# Patient Record
Sex: Female | Born: 1965 | Race: White | Hispanic: No | Marital: Married | Smoking: Never smoker
Health system: Southern US, Community
[De-identification: ages and names within clinical notes are randomized; demographics above are authoritative.]

## PROBLEM LIST (undated history)

## (undated) DIAGNOSIS — D219 Benign neoplasm of connective and other soft tissue, unspecified: Secondary | ICD-10-CM

## (undated) DIAGNOSIS — Z1211 Encounter for screening for malignant neoplasm of colon: Secondary | ICD-10-CM

## (undated) DIAGNOSIS — E785 Hyperlipidemia, unspecified: Secondary | ICD-10-CM

## (undated) DIAGNOSIS — M1990 Unspecified osteoarthritis, unspecified site: Secondary | ICD-10-CM

## (undated) DIAGNOSIS — M79671 Pain in right foot: Secondary | ICD-10-CM

## (undated) DIAGNOSIS — H8110 Benign paroxysmal vertigo, unspecified ear: Secondary | ICD-10-CM

## (undated) DIAGNOSIS — N946 Dysmenorrhea, unspecified: Secondary | ICD-10-CM

## (undated) DIAGNOSIS — H6120 Impacted cerumen, unspecified ear: Secondary | ICD-10-CM

## (undated) DIAGNOSIS — M722 Plantar fascial fibromatosis: Secondary | ICD-10-CM

## (undated) DIAGNOSIS — Z0000 Encounter for general adult medical examination without abnormal findings: Secondary | ICD-10-CM

## (undated) DIAGNOSIS — B353 Tinea pedis: Principal | ICD-10-CM

## (undated) DIAGNOSIS — N920 Excessive and frequent menstruation with regular cycle: Secondary | ICD-10-CM

## (undated) HISTORY — DX: Impacted cerumen, unspecified ear: H61.20

## (undated) HISTORY — DX: Benign neoplasm of connective and other soft tissue, unspecified: D21.9

## (undated) HISTORY — DX: Pain in right foot: M79.671

## (undated) HISTORY — PX: SPINE SURGERY: SHX786

## (undated) HISTORY — DX: Plantar fascial fibromatosis: M72.2

## (undated) HISTORY — PX: OTHER SURGICAL HISTORY: SHX169

## (undated) HISTORY — DX: Encounter for screening for malignant neoplasm of colon: Z12.11

## (undated) HISTORY — DX: Encounter for general adult medical examination without abnormal findings: Z00.00

## (undated) HISTORY — DX: Excessive and frequent menstruation with regular cycle: N92.0

## (undated) HISTORY — DX: Hyperlipidemia, unspecified: E78.5

## (undated) HISTORY — DX: Unspecified osteoarthritis, unspecified site: M19.90

## (undated) HISTORY — DX: Dysmenorrhea, unspecified: N94.6

## (undated) HISTORY — DX: Tinea pedis: B35.3

## (undated) HISTORY — DX: Benign paroxysmal vertigo, unspecified ear: H81.10

---
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
## 1998-03-26 ENCOUNTER — Other Ambulatory Visit: Admission: RE | Admit: 1998-03-26 | Discharge: 1998-03-26 | Payer: Self-pay | Admitting: *Deleted

## 1999-06-18 ENCOUNTER — Other Ambulatory Visit: Admission: RE | Admit: 1999-06-18 | Discharge: 1999-06-18 | Payer: Self-pay | Admitting: *Deleted

## 2000-06-22 ENCOUNTER — Other Ambulatory Visit: Admission: RE | Admit: 2000-06-22 | Discharge: 2000-06-22 | Payer: Self-pay | Admitting: *Deleted

## 2001-09-07 ENCOUNTER — Other Ambulatory Visit: Admission: RE | Admit: 2001-09-07 | Discharge: 2001-09-07 | Payer: Self-pay | Admitting: *Deleted

## 2002-09-11 ENCOUNTER — Other Ambulatory Visit: Admission: RE | Admit: 2002-09-11 | Discharge: 2002-09-11 | Payer: Self-pay | Admitting: *Deleted

## 2002-11-09 HISTORY — PX: OTHER SURGICAL HISTORY: SHX169

## 2002-12-10 ENCOUNTER — Ambulatory Visit (HOSPITAL_COMMUNITY): Admission: RE | Admit: 2002-12-10 | Discharge: 2002-12-10 | Payer: Self-pay | Admitting: Obstetrics and Gynecology

## 2002-12-10 ENCOUNTER — Encounter (INDEPENDENT_AMBULATORY_CARE_PROVIDER_SITE_OTHER): Payer: Self-pay

## 2003-09-17 ENCOUNTER — Other Ambulatory Visit: Admission: RE | Admit: 2003-09-17 | Discharge: 2003-09-17 | Payer: Self-pay | Admitting: *Deleted

## 2004-09-22 ENCOUNTER — Other Ambulatory Visit: Admission: RE | Admit: 2004-09-22 | Discharge: 2004-09-22 | Payer: Self-pay | Admitting: *Deleted

## 2005-10-25 ENCOUNTER — Other Ambulatory Visit: Admission: RE | Admit: 2005-10-25 | Discharge: 2005-10-25 | Payer: Self-pay | Admitting: Obstetrics & Gynecology

## 2006-12-07 ENCOUNTER — Other Ambulatory Visit: Admission: RE | Admit: 2006-12-07 | Discharge: 2006-12-07 | Payer: Self-pay | Admitting: Obstetrics & Gynecology

## 2007-12-13 ENCOUNTER — Other Ambulatory Visit: Admission: RE | Admit: 2007-12-13 | Discharge: 2007-12-13 | Payer: Self-pay | Admitting: Obstetrics & Gynecology

## 2009-06-09 ENCOUNTER — Encounter: Admission: RE | Admit: 2009-06-09 | Discharge: 2009-07-08 | Payer: Self-pay | Admitting: Obstetrics & Gynecology

## 2009-10-01 ENCOUNTER — Ambulatory Visit (HOSPITAL_COMMUNITY): Admission: RE | Admit: 2009-10-01 | Discharge: 2009-10-01 | Payer: Self-pay | Admitting: Internal Medicine

## 2010-06-26 LAB — HM MAMMOGRAPHY

## 2010-11-26 NOTE — Op Note (Signed)
NAME:  Jasmin Taylor, Jasmin Taylor                       ACCOUNT NO.:  [id number]   MEDICAL RECORD NO.:  [id number]                   PATIENT TYPE:  AMB   LOCATION:  SDC                                  FACILITY:  [facility]   PHYSICIAN:  Laqueta Linden, M.D.                 DATE OF BIRTH:  1965/10/29   DATE OF PROCEDURE:  12/10/2002  DATE OF DISCHARGE:                                 OPERATIVE REPORT   PREOPERATIVE DIAGNOSIS:  Large submucosal fibroid, with menorrhagia and  dysmenorrhea.   POSTOPERATIVE DIAGNOSIS:  Large submucosal fibroid, with menorrhagia and  dysmenorrhea.   PROCEDURE:  Hysteroscopic resection.   SURGEON:  Laqueta Linden, M.D.   ANESTHESIA:  General LMA.   ESTIMATED BLOOD LOSS:  Less than 100 mL.   SORBITOL NET INTAKE:  640 mL.   COMPLICATIONS:  None.   INDICATIONS:  The patient is a 45 year old nulligravid white female with a  large submucosal fibroid and associated menorrhagia, who desires  conservative management.  Sonohysterogram revealed a 3.1 x 2.9 x 2.7 cm  submucosal fibroid impinging almost totally on the endometrial cavity.  Due  to the extremely large size of the fibroid and the anticipated difficulty in  resecting it, she was treated with two months of preoperative Lupron Depot  3.75 mg to effect some preoperative shrinkage and facilitate surgery.  She  received her final injection on Nov 14, 2002.  She presents now for  hysteroscopic resection.  She and her husband have seen the informed consent  film and voiced their understanding and acceptance of the risks, benefits,  and alternatives and complications, including but not limited to anesthesia  risks, infection, bleeding, the possibility of requiring transfusion,  possible regrowth of the fibroid and need for repeat resection versus  hysterectomy, increased risk of incomplete resection due to the large size  of the fibroid, increased risk of uterine perforation due to the extensive  amount of resection  required, as well as the possibility of uterine scarring  and synechiae, which might cause problems with subsequent pregnancies.  They  have seen the informed consent film, voiced their understanding and  acceptance of all risks, and agreed to proceed.  She desires to restart her  patch contraceptive postoperatively.  Preoperative hemoglobin 13.3.  Coagulation studies normal.  Electrolytes normal.   DESCRIPTION OF PROCEDURE:  The patient was taken to the operating room an  after proper identification and consents were ascertained, she was placed on  the operating table in the supine position.  After the induction of general  LMA, she was placed in the [place] stirrups and the perineum and vagina were  prepped and draped in the routine sterile fashion.  A transurethral Foley  was placed, which was removed after the conclusion of the procedure, and  drained clear urine throughout.  Bimanual examination confirmed an anterior,  slightly irregular, mobile uterus.  The speculum was placed in  the vagina  and the cervix was grasped with a single-tooth tenaculum.  Of note, the  patient's cervix was quite tiny and nulliparous.  The tenaculum did pull off  a couple of times with a slight abrasion to the top of the cervix, although  there was no active bleeding or significant laceration noted.  The internal  os was gently dilated to a #33 Pratt dilator and the resectoscope with  double loop inserted under direct vision.  Continuous sorbitol infusion was  maintained.  The upper uterine fundus appeared within normal limits.  There  were no lesions other than the large submucosal fibroid protruding down from  the anterior uterine surface.  The resectoscope was placed on routine  settings and the fibroid resected in a routine manner with all tissue pieces  evacuated.  Of note, this was a very extensive resection as more and more of  the fibroid became available for resection due to myometrial contraction   throughout the procedure.  This procedure took approximately 30% greater  time and effort due to the large size and positioning of the fibroid.  All  tissue pieces were evacuated.  Hemostasis was noted to be excellent.  A post-  resection photograph was taken, although it was suboptimal due to being  somewhat underexposed.  In any event, it was felt that the fibroid had been  optimally resected.  No other lesions were identified.  All instruments were  removed.  There was a moderate amount of bleeding from the cervix, as would  be expected, with blood mixed with sorbitol.  The total net sorbitol intake  was 640 mL, although it was probably less due to some spillage.  Estimated  blood loss was less than 100 mL.  The tenaculum site was hemostatic.  All  instruments were removed.  The Foley catheter was removed, and the patient  was stable on transfer to the recovery room.  She will be observed and  discharged per anesthesia protocol.  She received 30 mg of Toradol IV and 30  mg IM prior to the conclusion of the procedure.  She will be discharged home  with routine instructions and told to start her Ortho Evra back the Sunday  following surgery.  She is to take Advil or Aleve as needed for any  cramping.  She is to follow up in the office in two to three weeks' time or  sooner for excessive pain, fever, bleeding, or other concerns.                                               Laqueta Linden, M.D.    LKS/MEDQ  D:  12/10/2002  T:  12/10/2002  Job:  [id number]

## 2011-08-19 ENCOUNTER — Encounter: Payer: Self-pay | Admitting: Family Medicine

## 2011-08-19 ENCOUNTER — Ambulatory Visit (INDEPENDENT_AMBULATORY_CARE_PROVIDER_SITE_OTHER): Payer: Self-pay | Admitting: Family Medicine

## 2011-08-19 DIAGNOSIS — Z0000 Encounter for general adult medical examination without abnormal findings: Secondary | ICD-10-CM

## 2011-08-19 DIAGNOSIS — E785 Hyperlipidemia, unspecified: Secondary | ICD-10-CM

## 2011-08-19 DIAGNOSIS — Z23 Encounter for immunization: Secondary | ICD-10-CM

## 2011-08-19 LAB — RENAL FUNCTION PANEL
Albumin: 4 g/dL (ref 3.5–5.2)
Calcium: 9.3 mg/dL (ref 8.4–10.5)
Chloride: 106 mEq/L (ref 96–112)
Potassium: 4.7 mEq/L (ref 3.5–5.1)
Sodium: 140 mEq/L (ref 135–145)

## 2011-08-19 LAB — LIPID PANEL
Cholesterol: 195 mg/dL (ref 0–200)
LDL Cholesterol: 115 mg/dL — ABNORMAL HIGH (ref 0–99)
Total CHOL/HDL Ratio: 3
VLDL: 19.4 mg/dL (ref 0.0–40.0)

## 2011-08-19 LAB — CBC
RBC: 4.23 Mil/uL (ref 3.87–5.11)
RDW: 12.9 % (ref 11.5–14.6)

## 2011-08-19 LAB — HEPATIC FUNCTION PANEL
ALT: 23 U/L (ref 0–35)
AST: 25 U/L (ref 0–37)
Albumin: 4 g/dL (ref 3.5–5.2)
Bilirubin, Direct: 0.1 mg/dL (ref 0.0–0.3)
Total Bilirubin: 0.5 mg/dL (ref 0.3–1.2)

## 2011-08-19 LAB — TSH: TSH: 0.79 u[IU]/mL (ref 0.35–5.50)

## 2011-08-19 NOTE — Patient Instructions (Signed)

## 2011-08-22 ENCOUNTER — Encounter: Payer: Self-pay | Admitting: Family Medicine

## 2011-08-22 DIAGNOSIS — E785 Hyperlipidemia, unspecified: Secondary | ICD-10-CM | POA: Insufficient documentation

## 2011-08-22 DIAGNOSIS — Z0000 Encounter for general adult medical examination without abnormal findings: Secondary | ICD-10-CM

## 2011-08-22 HISTORY — DX: Encounter for general adult medical examination without abnormal findings: Z00.00

## 2011-08-22 NOTE — Assessment & Plan Note (Signed)
Avoid trans fats, start a MegaRed Krill oil cap daily

## 2011-08-22 NOTE — Progress Notes (Signed)
Patient ID: Jasmin Taylor, female   DOB: 05-15-1966, 46 y.o.   MRN: [id number] Jasmin Taylor [id number] Jan 10, 1966 08/22/2011      Progress Note New Patient  Subjective  Chief Complaint  Chief Complaint  Patient presents with  . Establish Care    new patient/ labs- fasting    HPI  Patient is a 46 year old Caucasian female who is in today for new patient appointment. Overall she is in very good health and offers no significant recent concerns. No recent illness, fevers, chills, chest pain, palpitations, shortness of breath, GI or GU complaints. She follows with Dr. Leda Quail of OB/GYN for her Paps and mammograms. She works at the [employer].  Physical Exam  Constitutional: She is oriented to person, place, and time and well-developed, well-nourished, and in no distress. No distress.  HENT:  Head: Normocephalic and atraumatic.  Right Ear: External ear normal.  Left Ear: External ear normal.  Nose: Nose normal.  Mouth/Throat: Oropharynx is clear and moist. No oropharyngeal exudate.  Eyes: Conjunctivae are normal. Pupils are equal, round, and reactive to light. Right eye exhibits no discharge. Left eye exhibits no discharge. No scleral icterus.  Neck: Normal range of motion. Neck supple. No thyromegaly present.  Cardiovascular: Normal rate, regular rhythm, normal heart sounds and intact distal pulses.   No murmur heard. Pulmonary/Chest: Effort normal and breath sounds normal. No respiratory distress. She has no wheezes. She has no rales.  Abdominal: Soft. Bowel sounds are normal. She exhibits no distension and no mass. There is no tenderness.  Musculoskeletal: Normal range of motion. She exhibits no edema and no tenderness.       Midline scar down thoracic and lumbar spine  Lymphadenopathy:    She has no cervical adenopathy.  Neurological: She is alert and oriented to person, place, and time. She has normal reflexes. No cranial nerve deficit. Coordination normal.  Skin:  Skin is warm and dry. No rash noted. She is not diaphoretic.  Psychiatric: Mood, memory and affect normal.    Past Medical History  Diagnosis Date  . Hyperlipidemia   . Preventative health care 08/22/2011    Past Surgical History  Procedure Date  . 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 Fibroidectomy     Family History  Problem Relation Age of Onset  . Diabetes Mother     type 2  . Heart attack Mother 12  . Heart disease Mother     MI 2005, s/p 3 stents  . Hypertension Mother   . Hyperlipidemia Mother   . Heart attack Maternal Grandmother 67  . Diabetes Maternal Grandmother     type 2  . Hypertension Maternal Grandmother   . Hyperlipidemia Maternal Grandmother   . Stroke Maternal Grandfather   . Heart attack Paternal Grandfather   . Heart disease Paternal Grandfather     MI?  Marland Kitchen Hyperlipidemia Sister   . Diabetes Brother     type 2  . Alcohol abuse Brother   . Mental illness Brother     History   Social History  . Marital Status: Married    Spouse Name: N/A    Number of Children: N/A  . Years of Education: N/A   Occupational History  . Not on file.   Social History Main Topics  . Smoking status: Never Smoker   . Smokeless tobacco: Never Used  . Alcohol Use: No  . Drug Use: No  . Sexually Active:  Yes -- Female partner(s)   Other Topics Concern  . Not on file   Social History Narrative  . No narrative on file    No current outpatient prescriptions on file prior to visit.    No Known Allergies  Review of Systems  Review of Systems  Constitutional: Negative for fever, chills and malaise/fatigue.  HENT: Negative for hearing loss, nosebleeds and congestion.   Eyes: Negative for discharge.  Respiratory: Negative for cough, sputum production, shortness of breath and wheezing.   Cardiovascular: Negative for chest pain, palpitations and leg swelling.  Gastrointestinal: Negative for heartburn, nausea, vomiting,  abdominal pain, diarrhea, constipation and blood in stool.  Genitourinary: Negative for dysuria, urgency, frequency and hematuria.  Musculoskeletal: Negative for myalgias, back pain and falls.  Skin: Negative for rash.  Neurological: Negative for dizziness, tremors, sensory change, focal weakness, loss of consciousness, weakness and headaches.  Endo/Heme/Allergies: Negative for polydipsia. Does not bruise/bleed easily.  Psychiatric/Behavioral: Negative for depression and suicidal ideas. The patient is not nervous/anxious and does not have insomnia.     Objective  BP 128/81  Pulse 88  Temp(Src) 99.4 F (37.4 C) (Temporal)  Ht 5' 5.75" (1.67 m)  Wt 140 lb 1.9 oz (63.558 kg)  BMI 22.79 kg/m2  SpO2 99%  LMP 08/19/2011  Physical Exam  See above     Assessment & Plan  Preventative health care Given Tdap today, labs run today. Encouraged heart healthy diet. Requests records from previous PMD  Hyperlipidemia Avoid trans fats, start a MegaRed Krill oil cap daily

## 2011-08-22 NOTE — Assessment & Plan Note (Signed)
Given Tdap today, labs run today. Encouraged heart healthy diet. Requests records from previous PMD

## 2011-08-29 ENCOUNTER — Telehealth: Payer: Self-pay | Admitting: Family Medicine

## 2011-08-29 NOTE — Telephone Encounter (Signed)
I informed patient of her lab results and she requested we mail her a copy.  I put the copy in the mail to go out on 08/30/11.

## 2011-12-02 ENCOUNTER — Ambulatory Visit (INDEPENDENT_AMBULATORY_CARE_PROVIDER_SITE_OTHER): Payer: 59 | Admitting: Family Medicine

## 2011-12-02 ENCOUNTER — Encounter: Payer: Self-pay | Admitting: Family Medicine

## 2011-12-02 VITALS — BP 122/87 | HR 78 | Temp 99.8°F | Ht 65.75 in | Wt 142.0 lb

## 2011-12-02 DIAGNOSIS — B353 Tinea pedis: Secondary | ICD-10-CM

## 2011-12-02 HISTORY — DX: Tinea pedis: B35.3

## 2011-12-02 NOTE — Patient Instructions (Signed)
Athlete's Foot Athlete's foot (tinea pedis) is a fungal infection of the skin on the feet. It often occurs on the skin between the toes or underneath the toes. It can also occur on the soles of the feet. Athlete's foot is more likely to occur in hot, humid weather. Not washing your feet or changing your socks often enough can contribute to athlete's foot. The infection can spread from person to person (contagious). CAUSES Athlete's foot is caused by a fungus. This fungus thrives in warm, moist places. Most people get athlete's foot by sharing shower stalls, towels, and wet floors with an infected person. People with weakened immune systems, including those with diabetes, may be more likely to get athlete's foot. SYMPTOMS   Itchy areas between the toes or on the soles of the feet.   White, flaky, or scaly areas between the toes or on the soles of the feet.   Tiny, intensely itchy blisters between the toes or on the soles of the feet.   Tiny cuts on the skin. These cuts can develop a bacterial infection.   Thick or discolored toenails.  DIAGNOSIS  Your caregiver can usually tell what the problem is by doing a physical exam. Your caregiver may also take a skin sample from the rash area. The skin sample may be examined under a microscope, or it may be tested to see if fungus will grow in the sample. A sample may also be taken from your toenail for testing. TREATMENT  Over-the-counter and prescription medicines can be used to kill the fungus. These medicines are available as powders or creams. Your caregiver can suggest medicines for you. Fungal infections respond slowly to treatment. You may need to continue using your medicine for several weeks. PREVENTION   Do not share towels.   Wear sandals in wet areas, such as shared locker rooms and shared showers.   Keep your feet dry. Wear shoes that allow air to circulate. Wear cotton or wool socks.  HOME CARE INSTRUCTIONS   Take medicines as  directed by your caregiver. Do not use steroid creams on athlete's foot.   Keep your feet clean and cool. Wash your feet daily and dry them thoroughly, especially between your toes.   Change your socks every day. Wear cotton or wool socks. In hot climates, you may need to change your socks 2 to 3 times per day.   Wear sandals or canvas tennis shoes with good air circulation.   If you have blisters, soak your feet in Burow's solution or Epsom salts for 20 to 30 minutes, 2 times a day to dry out the blisters. Make sure you dry your feet thoroughly afterward.  SEEK MEDICAL CARE IF:   You have a fever.   You have swelling, soreness, warmth, or redness in your foot.   You are not getting better after 7 days of treatment.   You are not completely cured after 30 days.   You have any problems caused by your medicines.  MAKE SURE YOU:   Understand these instructions.   Will watch your condition.   Will get help right away if you are not doing well or get worse.  Document Released: 06/24/2000 Document Revised: 06/16/2011 Document Reviewed: 04/15/2011 [facility] Patient Information 2012 [place], [place].  Soak feet in 1/2 warm water and distilled white vinegar nightly, then gently debride thickened area with pumice stone.  Apply Lamisil gel (terbenafine) twice daily. Spray shoes with Athlete's foot spray and apply powder to socks, change socks  twice daily when hot.  If no improvement call for prescription of Lamisil tabs.  For toenails apply Vick's vapor rub to nails after vinegar soak

## 2011-12-02 NOTE — Assessment & Plan Note (Signed)
Mild case right foot. Encouraged Lamisil gel to feet bid, soaks in distilled white vinegar and treat all shoes. Call for tabs which she has used in past if no resonse

## 2011-12-02 NOTE — Progress Notes (Signed)
Patient ID: Jasmin Taylor, female   DOB: 12/02/1965, 46 y.o.   MRN: [id number] SYNDI PUA [id number] 05/18/1966 12/02/2011      Progress Note-Follow Up  Subjective  Chief Complaint  Chief Complaint  Patient presents with  . fungus    on R foot- pinky toe X a few months    HPI  46 year old Caucasian female who is in today complaining of thickening and some itching patches on the bottom of her right foot. She struggled with tinea pedis and onychomycosis in the past. Is previously used Lamisil tablets with good effect. No trouble to her toenails thus far. No fevers, malaise or other signs of illness are noted. Her husband and her mother both have struggled with tinea pedis and onychomycosis. Has not tried any over-the-counter treatments thus far  Past Medical History  Diagnosis Date  . Hyperlipidemia   . Preventative health care 08/22/2011  . Tinea pedis 12/02/2011    Past Surgical History  Procedure Date  . 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 Fibroidectomy     Family History  Problem Relation Age of Onset  . Diabetes Mother     type 2  . Heart attack Mother 31  . Heart disease Mother     MI 2005, s/p 3 stents  . Hypertension Mother   . Hyperlipidemia Mother   . Heart attack Maternal Grandmother 67  . Diabetes Maternal Grandmother     type 2  . Hypertension Maternal Grandmother   . Hyperlipidemia Maternal Grandmother   . Stroke Maternal Grandfather   . Heart attack Paternal Grandfather   . Heart disease Paternal Grandfather     MI?  Marland Kitchen Hyperlipidemia Sister   . Diabetes Brother     type 2  . Alcohol abuse Brother   . Mental illness Brother     History   Social History  . Marital Status: Married    Spouse Name: N/A    Number of Children: N/A  . Years of Education: N/A   Occupational History  . Not on file.   Social History Main Topics  . Smoking status: Never Smoker   . Smokeless tobacco: Never Used  .  Alcohol Use: No  . Drug Use: No  . Sexually Active: Yes -- Female partner(s)   Other Topics Concern  . Not on file   Social History Narrative  . No narrative on file    Current Outpatient Prescriptions on File Prior to Visit  Medication Sig Dispense Refill  . Calcium Carbonate-Vitamin D (CALTRATE 600+D) 600-400 MG-UNIT per tablet Take 1 tablet by mouth daily.      . Multiple Vitamin (MULTIVITAMIN) tablet Take 1 tablet by mouth daily.      . Norgestimate-Ethinyl Estradiol Triphasic (ORTHO TRI-CYCLEN LO) 0.18/0.215/0.25 MG-25 MCG tablet Take 1 tablet by mouth daily.      . vitamin C (ASCORBIC ACID) 500 MG tablet Take 500 mg by mouth daily.        No Known Allergies  Review of Systems  Review of Systems  Constitutional: Negative for fever and malaise/fatigue.  HENT: Negative for congestion.   Eyes: Negative for discharge.  Respiratory: Negative for shortness of breath.   Cardiovascular: Negative for chest pain, palpitations and leg swelling.  Gastrointestinal: Negative for nausea, abdominal pain and diarrhea.  Genitourinary: Negative for dysuria.  Skin: Negative for rash.  Neurological: Negative for headaches.  Psychiatric/Behavioral: Negative for depression and suicidal ideas. The patient  is not nervous/anxious and does not have insomnia.     Objective  BP 122/87  Pulse 78  Temp(Src) 99.8 F (37.7 C) (Temporal)  Ht 5' 5.75" (1.67 m)  Wt 142 lb (64.411 kg)  BMI 23.09 kg/m2  SpO2 99%  LMP 10/28/2011  Physical Exam  Physical Exam  Constitutional: She is oriented to person, place, and time and well-developed, well-nourished, and in no distress. No distress.  HENT:  Head: Normocephalic and atraumatic.  Eyes: Conjunctivae are normal.  Neck: Neck supple. No thyromegaly present.  Cardiovascular: Normal rate, regular rhythm and normal heart sounds.   No murmur heard. Pulmonary/Chest: Effort normal and breath sounds normal. She has no wheezes.  Abdominal: She exhibits no  distension and no mass.  Musculoskeletal: She exhibits no edema.  Lymphadenopathy:    She has no cervical adenopathy.  Neurological: She is alert and oriented to person, place, and time.  Skin: Skin is warm and dry. No rash noted. She is not diaphoretic.       Thickened patches no erythema or skin breakdown on bottom of skin  Psychiatric: Memory, affect and judgment normal.    Lab Results  Component Value Date   TSH 0.79 08/19/2011   Lab Results  Component Value Date   WBC 6.5 08/19/2011   HGB 13.5 08/19/2011   HCT 39.7 08/19/2011   MCV 93.7 08/19/2011   PLT 283.0 08/19/2011   Lab Results  Component Value Date   CREATININE 0.7 08/19/2011   BUN 14 08/19/2011   NA 140 08/19/2011   K 4.7 08/19/2011   CL 106 08/19/2011   CO2 27 08/19/2011   Lab Results  Component Value Date   ALT 23 08/19/2011   AST 25 08/19/2011   ALKPHOS 74 08/19/2011   BILITOT 0.5 08/19/2011   Lab Results  Component Value Date   CHOL 195 08/19/2011   Lab Results  Component Value Date   HDL 60.60 08/19/2011   Lab Results  Component Value Date   LDLCALC 115* 08/19/2011   Lab Results  Component Value Date   TRIG 97.0 08/19/2011   Lab Results  Component Value Date   CHOLHDL 3 08/19/2011     Assessment & Plan  Tinea pedis Mild case right foot. Encouraged Lamisil gel to feet bid, soaks in distilled white vinegar and treat all shoes. Call for tabs which she has used in past if no resonse

## 2011-12-16 ENCOUNTER — Telehealth: Payer: Self-pay

## 2011-12-16 NOTE — Telephone Encounter (Signed)
Pt states she has a few questions about athletes feet. There is no fungus on toenails. But pt would like to know if its ok to paint toenails? Spray shoes with antifungal spray- once or once daily? Put powder in socks daily- antifungal or regular powder. Pt informed that MD is out of the office until Monday morning and pt stated to hold this question until Monday when she returns. Please advise?

## 2011-12-19 ENCOUNTER — Telehealth: Payer: Self-pay

## 2011-12-19 NOTE — Telephone Encounter (Signed)
Ok to pain toenails, spray shoes prior and after wearing is best

## 2011-12-19 NOTE — Telephone Encounter (Signed)
Can continue same cream and powder. The rash and splitting skin can occur with the fungus, it should get better with treatment if it does not she should come in and let us see it so we can try a stronger medication

## 2011-12-19 NOTE — Telephone Encounter (Signed)
Pt informed

## 2011-12-19 NOTE — Telephone Encounter (Signed)
Patient left a message stating that she wanted MD to know that she has a rash on her toes and a cut on the bottom of her little toe? Pt stated she was using the tinactin powder and the lamisil cream? Please advise?

## 2011-12-19 NOTE — Telephone Encounter (Signed)
Patient would also like to know if she should be using antifungal or regular powder? Please advise?

## 2011-12-19 NOTE — Telephone Encounter (Signed)
Antifungal, like Lotrimin or Tinactin

## 2011-12-20 NOTE — Telephone Encounter (Signed)
Left a message for patient to return my call. 

## 2011-12-20 NOTE — Telephone Encounter (Signed)
Patient informed. 

## 2012-06-22 ENCOUNTER — Ambulatory Visit (INDEPENDENT_AMBULATORY_CARE_PROVIDER_SITE_OTHER): Payer: 59 | Admitting: Family Medicine

## 2012-06-22 ENCOUNTER — Encounter: Payer: Self-pay | Admitting: Family Medicine

## 2012-06-22 VITALS — BP 118/72 | HR 67 | Temp 98.4°F | Ht 65.75 in | Wt 143.0 lb

## 2012-06-22 DIAGNOSIS — H6122 Impacted cerumen, left ear: Secondary | ICD-10-CM

## 2012-06-22 DIAGNOSIS — H6120 Impacted cerumen, unspecified ear: Secondary | ICD-10-CM

## 2012-06-22 HISTORY — DX: Impacted cerumen, unspecified ear: H61.20

## 2012-06-22 NOTE — Patient Instructions (Addendum)
Cerumen Impaction  A cerumen impaction is when the wax in your ear forms a plug. This plug usually causes reduced hearing. Sometimes it also causes an earache or dizziness. Removing a cerumen impaction can be difficult and painful. The wax sticks to the ear canal. The canal is sensitive and bleeds easily. If you try to remove a heavy wax buildup with a cotton tipped swab, you may push it in further.  Irrigation with water, suction, and small ear curettes may be used to clear out the wax. If the impaction is fixed to the skin in the ear canal, ear drops may be needed for a few days to loosen the wax. People who build up a lot of wax frequently can use ear wax removal products available in your local drugstore.  SEEK MEDICAL CARE IF:    You develop an earache, increased hearing loss, or marked dizziness.  Document Released: 08/04/2004 Document Revised: 09/19/2011 Document Reviewed: 09/24/2009  [employer] Patient Information 2013 [employer].

## 2012-06-22 NOTE — Progress Notes (Signed)
Patient ID: Jasmin Taylor, female   DOB: [birth year], 46 y.o.   MRN: [id number] Jasmin Taylor [id number] 1966/01/30 06/22/2012      Progress Note-Follow Up  Subjective  Chief Complaint  Chief Complaint  Patient presents with  . ears cleaned    left ear    HPI  Patient is a 46 yo caucasian female, intoday c/o wax in her ears. Notes a decrease in hearing in that ear. No pain, itching, swelling, fevers, HA, tinnitus, neurologic c/o. No congrestion, cough, malaise, myalgias, cp, sob. Has not tried anything otc and has been bothering her for a week or so  Past Medical History  Diagnosis Date  . Hyperlipidemia   . Preventative health care 08/22/2011  . Tinea pedis 12/02/2011  . Cerumen impaction 06/22/2012    Past Surgical History  Procedure Date  . 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 Fibroidectomy     Family History  Problem Relation Age of Onset  . Diabetes Mother     type 2  . Heart attack Mother 6  . Heart disease Mother     MI 2005, s/p 3 stents  . Hypertension Mother   . Hyperlipidemia Mother   . Heart attack Maternal Grandmother 67  . Diabetes Maternal Grandmother     type 2  . Hypertension Maternal Grandmother   . Hyperlipidemia Maternal Grandmother   . Stroke Maternal Grandfather   . Heart attack Paternal Grandfather   . Heart disease Paternal Grandfather     MI?  Marland Kitchen Hyperlipidemia Sister   . Diabetes Brother     type 2  . Alcohol abuse Brother   . Mental illness Brother     History   Social History  . Marital Status: Married    Spouse Name: N/A    Number of Children: N/A  . Years of Education: N/A   Occupational History  . Not on file.   Social History Main Topics  . Smoking status: Never Smoker   . Smokeless tobacco: Never Used  . Alcohol Use: No  . Drug Use: No  . Sexually Active: Yes -- Female partner(s)   Other Topics Concern  . Not on file   Social History Narrative  . No narrative on file     Current Outpatient Prescriptions on File Prior to Visit  Medication Sig Dispense Refill  . Calcium Carbonate-Vitamin D (CALTRATE 600+D) 600-400 MG-UNIT per tablet Take 1 tablet by mouth daily.      . Multiple Vitamin (MULTIVITAMIN) tablet Take 1 tablet by mouth daily.      . NON FORMULARY Omega red      . Norgestimate-Ethinyl Estradiol Triphasic (ORTHO TRI-CYCLEN LO) 0.18/0.215/0.25 MG-25 MCG tablet Take 1 tablet by mouth daily.      . vitamin C (ASCORBIC ACID) 500 MG tablet Take 500 mg by mouth daily.      . MELOXICAM PO Take 1 tablet by mouth daily as needed.        No Known Allergies  Review of Systems  Review of Systems  Constitutional: Negative for fever and malaise/fatigue.  HENT: Positive for hearing loss. Negative for congestion.        Only on left, sounds muffled  Eyes: Negative for discharge.  Respiratory: Negative for shortness of breath.   Cardiovascular: Negative for chest pain, palpitations and leg swelling.  Gastrointestinal: Negative for nausea, abdominal pain and diarrhea.  Genitourinary: Negative for dysuria.  Musculoskeletal: Negative for falls.  Skin: Negative for rash.  Neurological: Negative for loss of consciousness and headaches.  Endo/Heme/Allergies: Negative for polydipsia.  Psychiatric/Behavioral: Negative for depression and suicidal ideas. The patient is not nervous/anxious and does not have insomnia.     Objective  BP 118/72  Pulse 67  Temp 98.4 F (36.9 C) (Temporal)  Ht 5' 5.75" (1.67 m)  Wt 143 lb (64.864 kg)  BMI 23.26 kg/m2  SpO2 100%  LMP 06/22/2012  Physical Exam  Physical Exam  Constitutional: She is oriented to person, place, and time and well-developed, well-nourished, and in no distress. No distress.  HENT:  Head: Normocephalic and atraumatic.  Right Ear: External ear normal.  Nose: Nose normal.       Left TM obscured by cerumen, no erythema or selling in canal  Neck: Normal range of motion. Neck supple. No thyromegaly  present.  Pulmonary/Chest: Effort normal.  Musculoskeletal: She exhibits no edema.  Lymphadenopathy:    She has no cervical adenopathy.  Neurological: She is alert and oriented to person, place, and time.  Skin: Skin is warm and dry. She is not diaphoretic. No erythema.  Psychiatric: Affect normal.    Lab Results  Component Value Date   TSH 0.79 08/19/2011   Lab Results  Component Value Date   WBC 6.5 08/19/2011   HGB 13.5 08/19/2011   HCT 39.7 08/19/2011   MCV 93.7 08/19/2011   PLT 283.0 08/19/2011   Lab Results  Component Value Date   CREATININE 0.7 08/19/2011   BUN 14 08/19/2011   NA 140 08/19/2011   K 4.7 08/19/2011   CL 106 08/19/2011   CO2 27 08/19/2011   Lab Results  Component Value Date   ALT 23 08/19/2011   AST 25 08/19/2011   ALKPHOS 74 08/19/2011   BILITOT 0.5 08/19/2011   Lab Results  Component Value Date   CHOL 195 08/19/2011   Lab Results  Component Value Date   HDL 60.60 08/19/2011   Lab Results  Component Value Date   LDLCALC 115* 08/19/2011   Lab Results  Component Value Date   TRIG 97.0 08/19/2011   Lab Results  Component Value Date   CHOLHDL 3 08/19/2011     Assessment & Plan  Cerumen impaction No sign of inflammation or infection, flushed in office without difficulty

## 2012-06-22 NOTE — Assessment & Plan Note (Signed)
No sign of inflammation or infection, flushed in office without difficulty

## 2012-08-15 ENCOUNTER — Other Ambulatory Visit (INDEPENDENT_AMBULATORY_CARE_PROVIDER_SITE_OTHER): Payer: 59

## 2012-08-15 DIAGNOSIS — E785 Hyperlipidemia, unspecified: Secondary | ICD-10-CM

## 2012-08-15 LAB — HEPATIC FUNCTION PANEL
ALT: 21 U/L (ref 0–35)
Albumin: 3.8 g/dL (ref 3.5–5.2)
Bilirubin, Direct: 0 mg/dL (ref 0.0–0.3)
Total Protein: 6.9 g/dL (ref 6.0–8.3)

## 2012-08-15 LAB — CBC
Hemoglobin: 13 g/dL (ref 12.0–15.0)
MCHC: 34 g/dL (ref 30.0–36.0)
Platelets: 248 10*3/uL (ref 150.0–400.0)
RDW: 12.7 % (ref 11.5–14.6)
WBC: 6.3 10*3/uL (ref 4.5–10.5)

## 2012-08-15 LAB — LIPID PANEL: VLDL: 20.6 mg/dL (ref 0.0–40.0)

## 2012-08-15 LAB — RENAL FUNCTION PANEL
CO2: 26 mEq/L (ref 19–32)
GFR: 88.17 mL/min (ref >60.00)
Glucose, Bld: 96 mg/dL (ref 70–99)
Phosphorus: 3.1 mg/dL (ref 2.3–4.6)

## 2012-08-15 NOTE — Progress Notes (Signed)
Labs only

## 2012-08-17 ENCOUNTER — Ambulatory Visit: Payer: 59 | Admitting: Family Medicine

## 2012-08-21 ENCOUNTER — Ambulatory Visit: Payer: 59 | Admitting: Family Medicine

## 2012-08-22 ENCOUNTER — Ambulatory Visit: Payer: 59 | Admitting: Family Medicine

## 2012-08-27 ENCOUNTER — Encounter: Payer: 59 | Admitting: Family Medicine

## 2012-08-28 ENCOUNTER — Encounter: Payer: Self-pay | Admitting: Family Medicine

## 2012-08-28 ENCOUNTER — Ambulatory Visit (INDEPENDENT_AMBULATORY_CARE_PROVIDER_SITE_OTHER): Payer: 59 | Admitting: Family Medicine

## 2012-08-28 VITALS — BP 118/78 | HR 81 | Temp 97.9°F | Ht 65.75 in | Wt 144.0 lb

## 2012-08-28 DIAGNOSIS — Z0000 Encounter for general adult medical examination without abnormal findings: Secondary | ICD-10-CM

## 2012-08-28 DIAGNOSIS — E785 Hyperlipidemia, unspecified: Secondary | ICD-10-CM

## 2012-08-28 NOTE — Progress Notes (Signed)
Patient ID: Jasmin Taylor, female   DOB: 1965-07-13, 47 y.o.   MRN: [id number] Jasmin Taylor [id number] 09/09/1965 08/28/2012      Progress Note New Patient  Subjective  Chief Complaint  Chief Complaint  Patient presents with  . Annual Exam    physical    HPI  Patient is a 47 year old Caucasian female in today for annual exam. She reports physically she's doing well. She denies any recent illness. She denies any headaches, congestion, sore throat, chest pain, palpitations, shortness of breath, GI or GU complaints. She struggling with a very ill dog as well as very ill mother. She reports she reports healing both of these relatively well but acknowledges moments of sadness. She follows with dermatology Dr. Emily Filbert present no recent concerning symptoms. She also has a gynecologist to manage his her Paps and mammograms.  Past Medical History  Diagnosis Date  . Hyperlipidemia   . Preventative health care 08/22/2011  . Tinea pedis 12/02/2011  . Cerumen impaction 06/22/2012    Past Surgical History  Procedure Laterality Date  . 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 Fibroidectomy      Family History  Problem Relation Age of Onset  . Diabetes Mother     type 2  . Heart attack Mother 37  . Heart disease Mother     MI 2005, s/p 3 stents  . Hypertension Mother   . Hyperlipidemia Mother   . Stroke Mother     ministrokes  . Dementia Mother   . Heart attack Maternal Grandmother 67  . Diabetes Maternal Grandmother     type 2  . Hypertension Maternal Grandmother   . Hyperlipidemia Maternal Grandmother   . Stroke Maternal Grandfather   . Heart attack Paternal Grandfather   . Heart disease Paternal Grandfather     MI?  Marland Kitchen Hyperlipidemia Sister   . Diabetes Brother     type 2  . Alcohol abuse Brother   . Mental illness Brother     History   Social History  . Marital Status: Married    Spouse Name: N/A    Number of Children: N/A  .  Years of Education: N/A   Occupational History  . Not on file.   Social History Main Topics  . Smoking status: Never Smoker   . Smokeless tobacco: Never Used  . Alcohol Use: No  . Drug Use: No  . Sexually Active: Yes -- Female partner(s)   Other Topics Concern  . Not on file   Social History Narrative  . No narrative on file    Current Outpatient Prescriptions on File Prior to Visit  Medication Sig Dispense Refill  . Calcium Carbonate-Vitamin D (CALTRATE 600+D) 600-400 MG-UNIT per tablet Take 1 tablet by mouth daily.      . MELOXICAM PO Take 1 tablet by mouth daily as needed.      . Multiple Vitamin (MULTIVITAMIN) tablet Take 1 tablet by mouth daily.      . NON FORMULARY Omega red      . Norgestimate-Ethinyl Estradiol Triphasic (ORTHO TRI-CYCLEN LO) 0.18/0.215/0.25 MG-25 MCG tablet Take 1 tablet by mouth daily.      . vitamin C (ASCORBIC ACID) 500 MG tablet Take 500 mg by mouth daily.       No current facility-administered medications on file prior to visit.    No Known Allergies  Review of Systems  Review of Systems  Constitutional: Negative for  fever and malaise/fatigue.  HENT: Negative for congestion.   Eyes: Negative for discharge.  Respiratory: Negative for shortness of breath.   Cardiovascular: Negative for chest pain, palpitations and leg swelling.  Gastrointestinal: Negative for nausea, abdominal pain and diarrhea.  Genitourinary: Negative for dysuria.  Musculoskeletal: Negative for falls.  Skin: Negative for rash.  Neurological: Negative for loss of consciousness and headaches.  Endo/Heme/Allergies: Negative for polydipsia.  Psychiatric/Behavioral: Negative for depression and suicidal ideas. The patient is not nervous/anxious and does not have insomnia.     Objective  BP 118/78  Pulse 81  Temp(Src) 97.9 F (36.6 C) (Oral)  Ht 5' 5.75" (1.67 m)  Wt 144 lb (65.318 kg)  BMI 23.42 kg/m2  SpO2 97%  LMP 08/17/2012  Physical Exam  Physical Exam   Constitutional: She is oriented to person, place, and time and well-developed, well-nourished, and in no distress. No distress.  HENT:  Head: Normocephalic and atraumatic.  Right Ear: External ear normal.  Left Ear: External ear normal.  Nose: Nose normal.  Mouth/Throat: Oropharynx is clear and moist. No oropharyngeal exudate.  Eyes: Conjunctivae are normal. Pupils are equal, round, and reactive to light. Right eye exhibits no discharge. Left eye exhibits no discharge. No scleral icterus.  Neck: Normal range of motion. Neck supple. No thyromegaly present.  Cardiovascular: Normal rate, regular rhythm, normal heart sounds and intact distal pulses.   No murmur heard. Pulmonary/Chest: Effort normal and breath sounds normal. No respiratory distress. She has no wheezes. She has no rales.  Abdominal: Soft. Bowel sounds are normal. She exhibits no distension and no mass. There is no tenderness.  Musculoskeletal: Normal range of motion. She exhibits no edema and no tenderness.  Lymphadenopathy:    She has no cervical adenopathy.  Neurological: She is alert and oriented to person, place, and time. She has normal reflexes. No cranial nerve deficit. Coordination normal.  Skin: Skin is warm and dry. No rash noted. She is not diaphoretic.  Psychiatric: Mood, memory and affect normal.       Assessment & Plan  Preventative health care Struggling with a a sick pet and a very ill mother. Overall she feels she is handling this well. Encouraged good sleep, regular exercise and heart healthy diet.  Hyperlipidemia Mild, avoid trans fats, minimize simple carbs and saturated fats. Encouraged Krill oil caps.

## 2012-08-28 NOTE — Assessment & Plan Note (Signed)
Mild, avoid trans fats, minimize simple carbs and saturated fats. Encouraged Krill oil caps.

## 2012-08-28 NOTE — Patient Instructions (Addendum)
Consider a probiotic such as Digestive Advantage for any heartburn or antibiotic use  Preventive Care for Adults, Female A healthy lifestyle and preventive care can promote health and wellness. Preventive health guidelines for women include the following key practices.  A routine yearly physical is a good way to check with your caregiver about your health and preventive screening. It is a chance to share any concerns and updates on your health, and to receive a thorough exam.  Visit your dentist for a routine exam and preventive care every 6 months. Brush your teeth twice a day and floss once a day. Good oral hygiene prevents tooth decay and gum disease.  The frequency of eye exams is based on your age, health, family medical history, use of contact lenses, and other factors. Follow your caregiver's recommendations for frequency of eye exams.  Eat a healthy diet. Foods like vegetables, fruits, whole grains, low-fat dairy products, and lean protein foods contain the nutrients you need without too many calories. Decrease your intake of foods high in solid fats, added sugars, and salt. Eat the right amount of calories for you.Get information about a proper diet from your caregiver, if necessary.  Regular physical exercise is one of the most important things you can do for your health. Most adults should get at least 150 minutes of moderate-intensity exercise (any activity that increases your heart rate and causes you to sweat) each week. In addition, most adults need muscle-strengthening exercises on 2 or more days a week.  Maintain a healthy weight. The body mass index (BMI) is a screening tool to identify possible weight problems. It provides an estimate of body fat based on height and weight. Your caregiver can help determine your BMI, and can help you achieve or maintain a healthy weight.For adults 20 years and older:  A BMI below 18.5 is considered underweight.  A BMI of 18.5 to 24.9 is  normal.  A BMI of 25 to 29.9 is considered overweight.  A BMI of 30 and above is considered obese.  Maintain normal blood lipids and cholesterol levels by exercising and minimizing your intake of saturated fat. Eat a balanced diet with plenty of fruit and vegetables. Blood tests for lipids and cholesterol should begin at age 40 and be repeated every 5 years. If your lipid or cholesterol levels are high, you are over 50, or you are at high risk for heart disease, you may need your cholesterol levels checked more frequently.Ongoing high lipid and cholesterol levels should be treated with medicines if diet and exercise are not effective.  If you smoke, find out from your caregiver how to quit. If you do not use tobacco, do not start.  If you are pregnant, do not drink alcohol. If you are breastfeeding, be very cautious about drinking alcohol. If you are not pregnant and choose to drink alcohol, do not exceed 1 drink per day. One drink is considered to be 12 ounces (355 mL) of beer, 5 ounces (148 mL) of wine, or 1.5 ounces (44 mL) of liquor.  Avoid use of street drugs. Do not share needles with anyone. Ask for help if you need support or instructions about stopping the use of drugs.  High blood pressure causes heart disease and increases the risk of stroke. Your blood pressure should be checked at least every 1 to 2 years. Ongoing high blood pressure should be treated with medicines if weight loss and exercise are not effective.  If you are 55 to 47 years  old, ask your caregiver if you should take aspirin to prevent strokes.  Diabetes screening involves taking a blood sample to check your fasting blood sugar level. This should be done once every 3 years, after age 4, if you are within normal weight and without risk factors for diabetes. Testing should be considered at a younger age or be carried out more frequently if you are overweight and have at least 1 risk factor for diabetes.  Breast cancer  screening is essential preventive care for women. You should practice "breast self-awareness." This means understanding the normal appearance and feel of your breasts and may include breast self-examination. Any changes detected, no matter how small, should be reported to a caregiver. Women in their 7s and 30s should have a clinical breast exam (CBE) by a caregiver as part of a regular health exam every 1 to 3 years. After age 37, women should have a CBE every year. Starting at age 75, women should consider having a mammography (breast X-ray test) every year. Women who have a family history of breast cancer should talk to their caregiver about genetic screening. Women at a high risk of breast cancer should talk to their caregivers about having magnetic resonance imaging (MRI) and a mammography every year.  The Pap test is a screening test for cervical cancer. A Pap test can show cell changes on the cervix that might become cervical cancer if left untreated. A Pap test is a procedure in which cells are obtained and examined from the lower end of the uterus (cervix).  Women should have a Pap test starting at age 21.  Between ages 21 and 54, Pap tests should be repeated every 2 years.  Beginning at age 84, you should have a Pap test every 3 years as long as the past 3 Pap tests have been normal.  Some women have medical problems that increase the chance of getting cervical cancer. Talk to your caregiver about these problems. It is especially important to talk to your caregiver if a new problem develops soon after your last Pap test. In these cases, your caregiver may recommend more frequent screening and Pap tests.  The above recommendations are the same for women who have or have not gotten the vaccine for human papillomavirus (HPV).  If you had a hysterectomy for a problem that was not cancer or a condition that could lead to cancer, then you no longer need Pap tests. Even if you no longer need a Pap  test, a regular exam is a good idea to make sure no other problems are starting.  If you are between ages 41 and 47, and you have had normal Pap tests going back 10 years, you no longer need Pap tests. Even if you no longer need a Pap test, a regular exam is a good idea to make sure no other problems are starting.  If you have had past treatment for cervical cancer or a condition that could lead to cancer, you need Pap tests and screening for cancer for at least 20 years after your treatment.  If Pap tests have been discontinued, risk factors (such as a new sexual partner) need to be reassessed to determine if screening should be resumed.  The HPV test is an additional test that may be used for cervical cancer screening. The HPV test looks for the virus that can cause the cell changes on the cervix. The cells collected during the Pap test can be tested for HPV. The HPV  test could be used to screen women aged 9 years and older, and should be used in women of any age who have unclear Pap test results. After the age of 4, women should have HPV testing at the same frequency as a Pap test.  Colorectal cancer can be detected and often prevented. Most routine colorectal cancer screening begins at the age of 63 and continues through age 54. However, your caregiver may recommend screening at an earlier age if you have risk factors for colon cancer. On a yearly basis, your caregiver may provide home test kits to check for hidden blood in the stool. Use of a small camera at the end of a tube, to directly examine the colon (sigmoidoscopy or colonoscopy), can detect the earliest forms of colorectal cancer. Talk to your caregiver about this at age 26, when routine screening begins. Direct examination of the colon should be repeated every 5 to 10 years through age 44, unless early forms of pre-cancerous polyps or small growths are found.  Hepatitis C blood testing is recommended for all people born from [birth year] through  1965 and any individual with known risks for hepatitis C.  Practice safe sex. Use condoms and avoid high-risk sexual practices to reduce the spread of sexually transmitted infections (STIs). STIs include gonorrhea, chlamydia, syphilis, trichomonas, herpes, HPV, and human immunodeficiency virus (HIV). Herpes, HIV, and HPV are viral illnesses that have no cure. They can result in disability, cancer, and death. Sexually active women aged 52 and younger should be checked for chlamydia. Older women with new or multiple partners should also be tested for chlamydia. Testing for other STIs is recommended if you are sexually active and at increased risk.  Osteoporosis is a disease in which the bones lose minerals and strength with aging. This can result in serious bone fractures. The risk of osteoporosis can be identified using a bone density scan. Women ages 32 and over and women at risk for fractures or osteoporosis should discuss screening with their caregivers. Ask your caregiver whether you should take a calcium supplement or vitamin D to reduce the rate of osteoporosis.  Menopause can be associated with physical symptoms and risks. Hormone replacement therapy is available to decrease symptoms and risks. You should talk to your caregiver about whether hormone replacement therapy is right for you.  Use sunscreen with sun protection factor (SPF) of 30 or more. Apply sunscreen liberally and repeatedly throughout the day. You should seek shade when your shadow is shorter than you. Protect yourself by wearing long sleeves, pants, a wide-brimmed hat, and sunglasses year round, whenever you are outdoors.  Once a month, do a whole body skin exam, using a mirror to look at the skin on your back. Notify your caregiver of new moles, moles that have irregular borders, moles that are larger than a pencil eraser, or moles that have changed in shape or color.  Stay current with required immunizations.  Influenza. You  need a dose every fall (or winter). The composition of the flu vaccine changes each year, so being vaccinated once is not enough.  Pneumococcal polysaccharide. You need 1 to 2 doses if you smoke cigarettes or if you have certain chronic medical conditions. You need 1 dose at age 81 (or older) if you have never been vaccinated.  Tetanus, diphtheria, pertussis (Tdap, Td). Get 1 dose of Tdap vaccine if you are younger than age 35, are over 55 and have contact with an infant, are a Research scientist (physical sciences), are pregnant, or  simply want to be protected from whooping cough. After that, you need a Td booster dose every 10 years. Consult your caregiver if you have not had at least 3 tetanus and diphtheria-containing shots sometime in your life or have a deep or dirty wound.  HPV. You need this vaccine if you are a woman age 32 or younger. The vaccine is given in 3 doses over 6 months.  Measles, mumps, rubella (MMR). You need at least 1 dose of MMR if you were born in 1957 or later. You may also need a second dose.  Meningococcal. If you are age 70 to 66 and a first-year college student living in a residence hall, or have one of several medical conditions, you need to get vaccinated against meningococcal disease. You may also need additional booster doses.  Zoster (shingles). If you are age 34 or older, you should get this vaccine.  Varicella (chickenpox). If you have never had chickenpox or you were vaccinated but received only 1 dose, talk to your caregiver to find out if you need this vaccine.  Hepatitis A. You need this vaccine if you have a specific risk factor for hepatitis A virus infection or you simply wish to be protected from this disease. The vaccine is usually given as 2 doses, 6 to 18 months apart.  Hepatitis B. You need this vaccine if you have a specific risk factor for hepatitis B virus infection or you simply wish to be protected from this disease. The vaccine is given in 3 doses, usually over 6  months. Preventive Services / Frequency Ages 28 to 54  Blood pressure check.** / Every 1 to 2 years.  Lipid and cholesterol check.** / Every 5 years beginning at age 11.  Clinical breast exam.** / Every 3 years for women in their 81s and 30s.  Pap test.** / Every 2 years from ages 105 through 39. Every 3 years starting at age 69 through age 47 or 33 with a history of 3 consecutive normal Pap tests.  HPV screening.** / Every 3 years from ages 52 through ages 65 to 69 with a history of 3 consecutive normal Pap tests.  Hepatitis C blood test.** / For any individual with known risks for hepatitis C.  Skin self-exam. / Monthly.  Influenza immunization.** / Every year.  Pneumococcal polysaccharide immunization.** / 1 to 2 doses if you smoke cigarettes or if you have certain chronic medical conditions.  Tetanus, diphtheria, pertussis (Tdap, Td) immunization. / A one-time dose of Tdap vaccine. After that, you need a Td booster dose every 10 years.  HPV immunization. / 3 doses over 6 months, if you are 78 and younger.  Measles, mumps, rubella (MMR) immunization. / You need at least 1 dose of MMR if you were born in 1957 or later. You may also need a second dose.  Meningococcal immunization. / 1 dose if you are age 91 to 48 and a first-year college student living in a residence hall, or have one of several medical conditions, you need to get vaccinated against meningococcal disease. You may also need additional booster doses.  Varicella immunization.** / Consult your caregiver.  Hepatitis A immunization.** / Consult your caregiver. 2 doses, 6 to 18 months apart.  Hepatitis B immunization.** / Consult your caregiver. 3 doses usually over 6 months. Ages 32 to 80  Blood pressure check.** / Every 1 to 2 years.  Lipid and cholesterol check.** / Every 5 years beginning at age 1.  Clinical breast exam.** /  Every year after age 41.  Mammogram.** / Every year beginning at age 66 and  continuing for as long as you are in good health. Consult with your caregiver.  Pap test.** / Every 3 years starting at age 22 through age 57 or 24 with a history of 3 consecutive normal Pap tests.  HPV screening.** / Every 3 years from ages 32 through ages 52 to 31 with a history of 3 consecutive normal Pap tests.  Fecal occult blood test (FOBT) of stool. / Every year beginning at age 9 and continuing until age 12. You may not need to do this test if you get a colonoscopy every 10 years.  Flexible sigmoidoscopy or colonoscopy.** / Every 5 years for a flexible sigmoidoscopy or every 10 years for a colonoscopy beginning at age 1 and continuing until age 86.  Hepatitis C blood test.** / For all people born from [birth year] through 1965 and any individual with known risks for hepatitis C.  Skin self-exam. / Monthly.  Influenza immunization.** / Every year.  Pneumococcal polysaccharide immunization.** / 1 to 2 doses if you smoke cigarettes or if you have certain chronic medical conditions.  Tetanus, diphtheria, pertussis (Tdap, Td) immunization.** / A one-time dose of Tdap vaccine. After that, you need a Td booster dose every 10 years.  Measles, mumps, rubella (MMR) immunization. / You need at least 1 dose of MMR if you were born in 1957 or later. You may also need a second dose.  Varicella immunization.** / Consult your caregiver.  Meningococcal immunization.** / Consult your caregiver.  Hepatitis A immunization.** / Consult your caregiver. 2 doses, 6 to 18 months apart.  Hepatitis B immunization.** / Consult your caregiver. 3 doses, usually over 6 months. Ages 63 and over  Blood pressure check.** / Every 1 to 2 years.  Lipid and cholesterol check.** / Every 5 years beginning at age 29.  Clinical breast exam.** / Every year after age 1.  Mammogram.** / Every year beginning at age 64 and continuing for as long as you are in good health. Consult with your caregiver.  Pap test.** / Every  3 years starting at age 1 through age 38 or 25 with a 3 consecutive normal Pap tests. Testing can be stopped between 65 and 70 with 3 consecutive normal Pap tests and no abnormal Pap or HPV tests in the past 10 years.  HPV screening.** / Every 3 years from ages 34 through ages 20 or 61 with a history of 3 consecutive normal Pap tests. Testing can be stopped between 65 and 70 with 3 consecutive normal Pap tests and no abnormal Pap or HPV tests in the past 10 years.  Fecal occult blood test (FOBT) of stool. / Every year beginning at age 37 and continuing until age 28. You may not need to do this test if you get a colonoscopy every 10 years.  Flexible sigmoidoscopy or colonoscopy.** / Every 5 years for a flexible sigmoidoscopy or every 10 years for a colonoscopy beginning at age 1 and continuing until age 79.  Hepatitis C blood test.** / For all people born from [birth year] through 1965 and any individual with known risks for hepatitis C.  Osteoporosis screening.** / A one-time screening for women ages 89 and over and women at risk for fractures or osteoporosis.  Skin self-exam. / Monthly.  Influenza immunization.** / Every year.  Pneumococcal polysaccharide immunization.** / 1 dose at age 77 (or older) if you have never been vaccinated.  Tetanus, diphtheria, pertussis (  Tdap, Td) immunization. / A one-time dose of Tdap vaccine if you are over 65 and have contact with an infant, are a Research scientist (physical sciences), or simply want to be protected from whooping cough. After that, you need a Td booster dose every 10 years.  Varicella immunization.** / Consult your caregiver.  Meningococcal immunization.** / Consult your caregiver.  Hepatitis A immunization.** / Consult your caregiver. 2 doses, 6 to 18 months apart.  Hepatitis B immunization.** / Check with your caregiver. 3 doses, usually over 6 months. ** Family history and personal history of risk and conditions may change your caregiver's  recommendations. Document Released: 08/23/2001 Document Revised: 09/19/2011 Document Reviewed: 11/22/2010 [facility] Patient Information 2013 [place], [place].

## 2012-08-28 NOTE — Assessment & Plan Note (Signed)
Struggling with a a sick pet and a very ill mother. Overall she feels she is handling this well. Encouraged good sleep, regular exercise and heart healthy diet.

## 2012-09-20 ENCOUNTER — Encounter: Payer: Self-pay | Admitting: Family Medicine

## 2013-01-08 ENCOUNTER — Ambulatory Visit: Payer: 59 | Admitting: Family Medicine

## 2013-01-09 ENCOUNTER — Ambulatory Visit (INDEPENDENT_AMBULATORY_CARE_PROVIDER_SITE_OTHER): Payer: 59 | Admitting: Family

## 2013-01-09 ENCOUNTER — Encounter: Payer: Self-pay | Admitting: Family

## 2013-01-09 VITALS — BP 112/80 | HR 81 | Temp 98.4°F | Resp 16 | Wt 143.1 lb

## 2013-01-09 DIAGNOSIS — K644 Residual hemorrhoidal skin tags: Secondary | ICD-10-CM

## 2013-01-09 DIAGNOSIS — J069 Acute upper respiratory infection, unspecified: Secondary | ICD-10-CM | POA: Insufficient documentation

## 2013-01-09 MED ORDER — HYDROCORTISONE ACE-PRAMOXINE 1-1 % RE FOAM: 1.0000 | Freq: Two times a day (BID) | RECTAL | Status: DC

## 2013-01-09 NOTE — Assessment & Plan Note (Signed)
Miralax powder (available OTC) one cap in 8 ounces of juice once daily as needed for constipation. Eat lots of fresh fruits and veggies and drink 8 glasses of water a day. Sprinkle epsom salts in a warm bathtub and soak twice daily as needed. Start proctofoam twice daily until symptoms improve.

## 2013-01-09 NOTE — Progress Notes (Signed)
Subjective:    Patient ID: Jasmin Taylor, female    DOB: 03/23/1966, 47 y.o.   MRN: [id number]  HPI  Reports rectal discomfort for 10 days. Denies rectal bleeding or known hx of hemorrhoids.  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  URI-  Reports that she has been coughing, woke up with sore throat.  Reports that she had a GI virus overnight on Monday- with diarrhea.  Reports that she is eating/drinking ok. Denies current issue with fever.    Review of Systems See HPI  Past Medical History  Diagnosis Date  . Hyperlipidemia   . Preventative health care 08/22/2011  . Tinea pedis 12/02/2011  . Cerumen impaction 06/22/2012    History   Social History  . Marital Status: Married    Spouse Name: N/A    Number of Children: N/A  . Years of Education: N/A   Occupational History  . Not on file.   Social History Main Topics  . Smoking status: Never Smoker   . Smokeless tobacco: Never Used  . Alcohol Use: No  . Drug Use: No  . Sexually Active: Yes -- Female partner(s)   Other Topics Concern  . Not on file   Social History Narrative  . No narrative on file    Past Surgical History  Procedure Laterality Date  . 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 Fibroidectomy      Family History  Problem Relation Age of Onset  . Diabetes Mother     type 2  . Heart attack Mother 55  . Heart disease Mother     MI 2005, s/p 3 stents  . Hypertension Mother   . Hyperlipidemia Mother   . Stroke Mother     ministrokes  . Dementia Mother   . Heart attack Maternal Grandmother 67  . Diabetes Maternal Grandmother     type 2  . Hypertension Maternal Grandmother   . Hyperlipidemia Maternal Grandmother   . Stroke Maternal Grandfather   . Heart attack Paternal Grandfather   . Heart disease Paternal Grandfather     MI?  Marland Kitchen Hyperlipidemia Sister   . Diabetes Brother     type 2  . Alcohol abuse Brother   . Mental illness Brother     No Known  Allergies  Current Outpatient Prescriptions on File Prior to Visit  Medication Sig Dispense Refill  . Calcium Carbonate-Vitamin D (CALTRATE 600+D) 600-400 MG-UNIT per tablet Take 1 tablet by mouth daily.      . MELOXICAM PO Take 1 tablet by mouth daily as needed.      . Multiple Vitamin (MULTIVITAMIN) tablet Take 1 tablet by mouth daily.      . NON FORMULARY Omega red      . Norgestimate-Ethinyl Estradiol Triphasic (ORTHO TRI-CYCLEN LO) 0.18/0.215/0.25 MG-25 MCG tablet Take 1 tablet by mouth daily.      . vitamin C (ASCORBIC ACID) 500 MG tablet Take 500 mg by mouth daily.       No current facility-administered medications on file prior to visit.    BP 112/80  Pulse 81  Temp(Src) 98.4 F (36.9 C) (Oral)  Resp 16  Wt 143 lb 1.3 oz (64.901 kg)  BMI 23.27 kg/m2  SpO2 99%       Objective:   Physical Exam  Constitutional: She is oriented to person, place, and time. She appears well-developed and well-nourished. No distress.  HENT:  Head: Normocephalic  and atraumatic.  Right Ear: Tympanic membrane and ear canal normal.  Left Ear: Tympanic membrane and ear canal normal.  Mouth/Throat: No oropharyngeal exudate, posterior oropharyngeal edema or posterior oropharyngeal erythema.  Cardiovascular: Normal rate and regular rhythm.   No murmur heard. Pulmonary/Chest: Effort normal and breath sounds normal. No respiratory distress. She has no wheezes. She has no rales.  Genitourinary:  + exernal hemorrhoid is noted  Neurological: She is alert and oriented to person, place, and time.  Psychiatric: She has a normal mood and affect. Her behavior is normal. Judgment and thought content normal.          Assessment & Plan:

## 2013-01-09 NOTE — Patient Instructions (Addendum)
You may use Miralax powder (available OTC) one cap in 8 ounces of juice once daily as needed for constipation. Eat lots of fresh fruits and veggies and drink 8 glasses of water a day. Sprinkle epsom salts in a warm bathtub and soak twice daily as needed. Start proctofoam twice daily until symptoms improve.  Call if cold symptoms are not resolved in 1 week.

## 2013-01-09 NOTE — Assessment & Plan Note (Signed)
Mild URI symptoms.  Pt to call if symptoms worsen or if not improved in 2-3 days.

## 2013-02-21 ENCOUNTER — Other Ambulatory Visit: Payer: Self-pay | Admitting: Certified Nurse Midwife

## 2013-02-22 NOTE — Telephone Encounter (Signed)
eScribe request for refill on ORTHO TRICYCLEN LO Last filled - 03/01/12 X 1 YEAR Last AEX - 03/01/12 Next AEX - 03/04/13 1 month supply sent until AEX.

## 2013-03-04 ENCOUNTER — Encounter: Payer: Self-pay | Admitting: Certified Nurse Midwife

## 2013-03-04 ENCOUNTER — Ambulatory Visit: Payer: Self-pay | Admitting: Certified Nurse Midwife

## 2013-03-05 ENCOUNTER — Ambulatory Visit (INDEPENDENT_AMBULATORY_CARE_PROVIDER_SITE_OTHER): Payer: 59 | Admitting: Certified Nurse Midwife

## 2013-03-05 ENCOUNTER — Encounter: Payer: Self-pay | Admitting: Certified Nurse Midwife

## 2013-03-05 VITALS — BP 102/62 | HR 60 | Resp 16 | Ht 66.0 in | Wt 147.0 lb

## 2013-03-05 DIAGNOSIS — Z309 Encounter for contraceptive management, unspecified: Secondary | ICD-10-CM

## 2013-03-05 DIAGNOSIS — IMO0001 Reserved for inherently not codable concepts without codable children: Secondary | ICD-10-CM

## 2013-03-05 DIAGNOSIS — Z0000 Encounter for general adult medical examination without abnormal findings: Secondary | ICD-10-CM

## 2013-03-05 DIAGNOSIS — Z01419 Encounter for gynecological examination (general) (routine) without abnormal findings: Secondary | ICD-10-CM

## 2013-03-05 LAB — POCT URINALYSIS DIPSTICK
Leukocytes, UA: NEGATIVE
Protein, UA: NEGATIVE

## 2013-03-05 MED ORDER — NORGESTIM-ETH ESTRAD TRIPHASIC 0.18/0.215/0.25 MG-25 MCG PO TABS: 1.0000 | ORAL_TABLET | Freq: Every day | ORAL | Status: DC

## 2013-03-05 NOTE — Patient Instructions (Signed)

## 2013-03-05 NOTE — Progress Notes (Signed)
47 y.o. G0P0000 Married Caucasian Fe here for annual exam.  Periods normal, no issues. Contraception working well. Sees PCP for aex, labs. Recent visit to PCP for hemorrhoids, but has resolved with Miralax use.  No health issues today.  Patient's last menstrual period was 03/01/2013.          Sexually active: yes  The current method of family planning is OCP (estrogen/progesterone).    Exercising: no  exercise Smoker:  no  Health Maintenance: Pap:  03-01-12 neg HPV HR neg MMG:  09-05-12 normal Colonoscopy:  none BMD:   none TDaP:  2014 Labs: Poct urine-neg Self breast exam: not done   reports that she has never smoked. She has never used smokeless tobacco. She reports that she does not drink alcohol or use illicit drugs.  Past Medical History  Diagnosis Date  . Hyperlipidemia   . Preventative health care 08/22/2011  . Tinea pedis 12/02/2011  . Cerumen impaction 06/22/2012  . Dysmenorrhea   . Menorrhagia   . Fibroid     Past Surgical History  Procedure Laterality Date  . 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 Fibroidectomy    . Hysteroscopic resection  5/04    Current Outpatient Prescriptions  Medication Sig Dispense Refill  . Calcium Carbonate-Vitamin D (CALTRATE 600+D) 600-400 MG-UNIT per tablet Take 1 tablet by mouth daily.      . MELOXICAM PO Take 1 tablet by mouth daily as needed.      . Multiple Vitamin (MULTIVITAMIN) tablet Take 1 tablet by mouth daily.      . NON FORMULARY daily. Omega red       . ORTHO TRI-CYCLEN LO 0.18/0.215/0.25 MG-25 MCG tab TAKE 1 TABLET BY MOUTH EVERY DAY  28 tablet  0   No current facility-administered medications for this visit.    Family History  Problem Relation Age of Onset  . Diabetes Mother     type 2  . Heart attack Mother 47  . Heart disease Mother     MI 2005, s/p 3 stents  . Hypertension Mother   . Hyperlipidemia Mother   . Stroke Mother     ministrokes  . Dementia Mother   . Heart  attack Maternal Grandmother 67  . Diabetes Maternal Grandmother     type 2  . Hypertension Maternal Grandmother   . Hyperlipidemia Maternal Grandmother   . Stroke Maternal Grandfather   . Heart attack Paternal Grandfather   . Heart disease Paternal Grandfather     MI?  Marland Kitchen Hyperlipidemia Sister   . Diabetes Brother     type 2  . Alcohol abuse Brother   . Mental illness Brother     ROS:  Pertinent items are noted in HPI.  Otherwise, a comprehensive ROS was negative.  Exam:   BP 102/62  Pulse 60  Resp 16  Ht 5\' 6"  (1.676 m)  Wt 147 lb (66.679 kg)  BMI 23.74 kg/m2  LMP 03/01/2013 Height: 5\' 6"  (167.6 cm)  Ht Readings from Last 3 Encounters:  03/05/13 5\' 6"  (1.676 m)  08/28/12 5' 5.75" (1.67 m)  06/22/12 5' 5.75" (1.67 m)    General appearance: alert, cooperative and appears stated age Head: Normocephalic, without obvious abnormality, atraumatic Neck: no adenopathy, supple, symmetrical, trachea midline and thyroid normal to inspection and palpation Lungs: clear to auscultation bilaterally Breasts: normal appearance, no masses or tenderness, No nipple retraction or dimpling, No nipple discharge or bleeding, No  axillary or supraclavicular adenopathy Heart: regular rate and rhythm Abdomen: soft, non-tender; no masses,  no organomegaly Extremities: extremities normal, atraumatic, no cyanosis or edema Skin: Skin color, texture, turgor normal. No rashes or lesions Lymph nodes: Cervical, supraclavicular, and axillary nodes normal. No abnormal inguinal nodes palpated Neurologic: Grossly normal   Pelvic: External genitalia:  no lesions              Urethra:  normal appearing urethra with no masses, tenderness or lesions              Bartholin's and Skene's: normal                 Vagina: normal appearing vagina with normal color and discharge, no lesions              Cervix: normal, non tender              Pap taken: no Bimanual Exam:  Uterus:  normal size, contour, position,  consistency, mobility, non-tender and anteflexed              Adnexa: normal adnexa and no mass, fullness, tenderness               Rectovaginal: Confirms               Anus:  normal sphincter tone, no lesions  A:  Well Woman with normal exam  Contraception OCP desired  P:   Reviewed health and wellness pertinent to exam  Rx Ortho tricyclen see order  Pap smear as per guidelines   Mammogram yearly pap smear not taken today  counseled on breast self exam, mammography screening, use and side effects of OCP's, adequate intake of calcium and vitamin D, diet and exercise  Rv aex, prn  An After Visit Summary was printed and given to the patient.

## 2013-03-05 NOTE — Progress Notes (Signed)
Note reviewed, agree with plan.  Ruta Capece, MD  

## 2013-04-30 ENCOUNTER — Telehealth: Payer: Self-pay

## 2013-04-30 NOTE — Telephone Encounter (Signed)
Pt states she was visiting her mom at her nursing home and there was scabies. Pt doesn't think she has scabies but would like to know what she can do to prevent it?  Per MD pt can get tree oil and use as a body wash and shampoo.  Jasmin Taylor can you please let the patient know this please?

## 2013-05-01 NOTE — Telephone Encounter (Signed)
Patient informed; states she has appt w/Dermatology tomorrow & will discuss with them as well/SLS

## 2013-05-16 ENCOUNTER — Other Ambulatory Visit: Payer: Self-pay

## 2013-07-05 ENCOUNTER — Encounter: Payer: Self-pay | Admitting: Family Medicine

## 2013-07-05 ENCOUNTER — Ambulatory Visit (INDEPENDENT_AMBULATORY_CARE_PROVIDER_SITE_OTHER): Payer: 59 | Admitting: Family Medicine

## 2013-07-05 VITALS — BP 104/72 | HR 76 | Temp 98.1°F | Ht 65.75 in | Wt 142.1 lb

## 2013-07-05 DIAGNOSIS — M79609 Pain in unspecified limb: Secondary | ICD-10-CM

## 2013-07-05 DIAGNOSIS — M722 Plantar fascial fibromatosis: Secondary | ICD-10-CM

## 2013-07-05 DIAGNOSIS — M79671 Pain in right foot: Secondary | ICD-10-CM

## 2013-07-05 HISTORY — DX: Plantar fascial fibromatosis: M72.2

## 2013-07-05 HISTORY — DX: Pain in right foot: M79.671

## 2013-07-05 NOTE — Assessment & Plan Note (Signed)
For several months. Worse in am. Orthotics by [facility] online or Dr Margart Sickles has a machine at some [facility]  Night splint at Medical supply store or online [facility]/Dr Ralene Cork will consider referral if worse  Ice and Aspercreme or Icy Hot after stretching as directed twice daily

## 2013-07-05 NOTE — Progress Notes (Signed)
Pre visit review using our clinic review tool, if applicable. No additional management support is needed unless otherwise documented below in the visit note. 

## 2013-07-05 NOTE — Patient Instructions (Signed)
Orthotics by [facility] online or Dr Margart Sickles has a machine at some [employer] splint at Medical supply store or online [facility]/Dr Lenetta Quaker and Aspercreme or [employer] after stretching as directed twice daily  Plantar Fasciitis Plantar fasciitis is a common condition that causes foot pain. It is soreness (inflammation) of the band of tough fibrous tissue on the bottom of the foot that runs from the heel bone (calcaneus) to the ball of the foot. The cause of this soreness may be from excessive standing, poor fitting shoes, running on hard surfaces, being overweight, having an abnormal walk, or overuse (this is common in runners) of the painful foot or feet. It is also common in aerobic exercise dancers and ballet dancers. SYMPTOMS  Most people with plantar fasciitis complain of:  Severe pain in the morning on the bottom of their foot especially when taking the first steps out of bed. This pain recedes after a few minutes of walking.  Severe pain is experienced also during walking following a long period of inactivity.  Pain is worse when walking barefoot or up stairs DIAGNOSIS   Your caregiver will diagnose this condition by examining and feeling your foot.  Special tests such as X-rays of your foot, are usually not needed. PREVENTION   Consult a sports medicine professional before beginning a new exercise program.  Walking programs offer a good workout. With walking there is a lower chance of overuse injuries common to runners. There is less impact and less jarring of the joints.  Begin all new exercise programs slowly. If problems or pain develop, decrease the amount of time or distance until you are at a comfortable level.  Wear good shoes and replace them regularly.  Stretch your foot and the heel cords at the back of the ankle (Achilles tendon) both before and after exercise.  Run or exercise on even surfaces that are not hard. For example, asphalt is better than  pavement.  Do not run barefoot on hard surfaces.  If using a treadmill, vary the incline.  Do not continue to workout if you have foot or joint problems. Seek professional help if they do not improve. HOME CARE INSTRUCTIONS   Avoid activities that cause you pain until you recover.  Use ice or cold packs on the problem or painful areas after working out.  Only take over-the-counter or prescription medicines for pain, discomfort, or fever as directed by your caregiver.  Soft shoe inserts or athletic shoes with air or gel sole cushions may be helpful.  If problems continue or become more severe, consult a sports medicine caregiver or your own health care provider. Cortisone is a potent anti-inflammatory medication that may be injected into the painful area. You can discuss this treatment with your caregiver. MAKE SURE YOU:   Understand these instructions.  Will watch your condition.  Will get help right away if you are not doing well or get worse. Document Released: 03/22/2001 Document Revised: 09/19/2011 Document Reviewed: 05/21/2008 [facility] Patient Information 2014 [place], [place].

## 2013-07-05 NOTE — Progress Notes (Signed)
Patient ID: Jasmin Taylor, female   DOB: March 17, 1966, 47 y.o.   MRN: [id number] GHADEER KASTELIC [id number] Oct 01, 1965 07/05/2013      Progress Note-Follow Up  Subjective  Chief Complaint  Chief Complaint  Patient presents with  . Foot Pain    HPI  Patient is a 47 year old Caucasian female who is in today for several months with right and right heel pain. She reports it is worse upon arising first thing in the morning and after prolonged sitting. She acknowledges she was wearing shoes without packs a lot prior to this starting. It is painful to touch. No redness or warmth. No acute injury. She's not tried any stretching, orthotics her pain meds thus far.  Past Medical History  Diagnosis Date  . Hyperlipidemia   . Preventative health care 08/22/2011  . Tinea pedis 12/02/2011  . Cerumen impaction 06/22/2012  . Dysmenorrhea   . Menorrhagia   . Fibroid   . Pain of right heel 07/05/2013    Past Surgical History  Procedure Laterality Date  . 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 Fibroidectomy    . Hysteroscopic resection  5/04    Family History  Problem Relation Age of Onset  . Diabetes Mother     type 2  . Heart attack Mother 61  . Heart disease Mother     MI 2005, s/p 3 stents  . Hypertension Mother   . Hyperlipidemia Mother   . Stroke Mother     ministrokes  . Dementia Mother   . Heart attack Maternal Grandmother 67  . Diabetes Maternal Grandmother     type 2  . Hypertension Maternal Grandmother   . Hyperlipidemia Maternal Grandmother   . Stroke Maternal Grandfather   . Heart attack Paternal Grandfather   . Heart disease Paternal Grandfather     MI?  Marland Kitchen Hyperlipidemia Sister   . Diabetes Brother     type 2  . Alcohol abuse Brother   . Mental illness Brother     History   Social History  . Marital Status: Married    Spouse Name: N/A    Number of Children: N/A  . Years of Education: N/A   Occupational History  .  Not on file.   Social History Main Topics  . Smoking status: Never Smoker   . Smokeless tobacco: Never Used  . Alcohol Use: No  . Drug Use: No  . Sexual Activity: Yes    Partners: Male    Birth Control/ Protection: Pill   Other Topics Concern  . Not on file   Social History Narrative  . No narrative on file    Current Outpatient Prescriptions on File Prior to Visit  Medication Sig Dispense Refill  . Calcium Carbonate-Vitamin D (CALTRATE 600+D) 600-400 MG-UNIT per tablet Take 1 tablet by mouth daily.      . MELOXICAM PO Take 1 tablet by mouth daily as needed.      . Multiple Vitamin (MULTIVITAMIN) tablet Take 1 tablet by mouth daily.      . NON FORMULARY daily. Omega red       . Norgestimate-Ethinyl Estradiol Triphasic (ORTHO TRI-CYCLEN LO) 0.18/0.215/0.25 MG-25 MCG tab Take 1 tablet by mouth daily.  3 Package  4   No current facility-administered medications on file prior to visit.    No Known Allergies  Review of Systems  Review of Systems  Constitutional: Negative for fever and malaise/fatigue.  HENT:  Negative for congestion.   Eyes: Negative for discharge.  Respiratory: Negative for shortness of breath.   Cardiovascular: Negative for chest pain, palpitations and leg swelling.  Gastrointestinal: Negative for nausea, abdominal pain and diarrhea.  Genitourinary: Negative for dysuria.  Musculoskeletal: Positive for joint pain. Negative for falls.       Right heel pain  Skin: Negative for rash.  Neurological: Negative for loss of consciousness and headaches.  Endo/Heme/Allergies: Negative for polydipsia.  Psychiatric/Behavioral: Negative for depression and suicidal ideas. The patient is not nervous/anxious and does not have insomnia.     Objective  BP 104/72  Pulse 76  Temp(Src) 98.1 F (36.7 C) (Oral)  Ht 5' 5.75" (1.67 m)  Wt 142 lb 1.3 oz (64.447 kg)  BMI 23.11 kg/m2  SpO2 99%  LMP 06/19/2013  Physical Exam  Physical Exam  Constitutional: She is  oriented to person, place, and time and well-developed, well-nourished, and in no distress. No distress.  HENT:  Head: Normocephalic and atraumatic.  Eyes: Conjunctivae are normal.  Neck: Neck supple. No thyromegaly present.  Cardiovascular: Normal rate, regular rhythm and normal heart sounds.   No murmur heard. Pulmonary/Chest: Effort normal and breath sounds normal. She has no wheezes.  Abdominal: She exhibits no distension and no mass.  Musculoskeletal: She exhibits no edema.  Lymphadenopathy:    She has no cervical adenopathy.  Neurological: She is alert and oriented to person, place, and time.  Skin: Skin is warm and dry. No rash noted. She is not diaphoretic.  Psychiatric: Memory, affect and judgment normal.    Lab Results  Component Value Date   TSH 0.79 08/19/2011   Lab Results  Component Value Date   WBC 6.3 08/15/2012   HGB 13.0 08/15/2012   HCT 38.3 08/15/2012   MCV 91.6 08/15/2012   PLT 248.0 08/15/2012   Lab Results  Component Value Date   CREATININE 0.8 08/15/2012   BUN 16 08/15/2012   NA 140 08/15/2012   K 4.4 08/15/2012   CL 109 08/15/2012   CO2 26 08/15/2012   Lab Results  Component Value Date   ALT 21 08/15/2012   AST 22 08/15/2012   ALKPHOS 65 08/15/2012   BILITOT 0.5 08/15/2012   Lab Results  Component Value Date   CHOL 174 08/15/2012   Lab Results  Component Value Date   HDL 50.40 08/15/2012   Lab Results  Component Value Date   LDLCALC 103* 08/15/2012   Lab Results  Component Value Date   TRIG 103.0 08/15/2012   Lab Results  Component Value Date   CHOLHDL 3 08/15/2012     Assessment & Plan  Pain of right heel For several months. Worse in am. Orthotics by [facility]prem[facility] online or Dr Margart Sickles has a machine at [facility]  Night splint at Medical supply store or online [facility]/Dr Ralene Cork will consider referral if worse  Ice and Aspercreme or Icy Hot after stretching as directed twice daily

## 2013-08-22 ENCOUNTER — Telehealth: Payer: Self-pay | Admitting: *Deleted

## 2013-08-22 DIAGNOSIS — Z79899 Other long term (current) drug therapy: Secondary | ICD-10-CM

## 2013-08-22 DIAGNOSIS — I10 Essential (primary) hypertension: Secondary | ICD-10-CM

## 2013-08-22 DIAGNOSIS — E785 Hyperlipidemia, unspecified: Secondary | ICD-10-CM

## 2013-08-22 DIAGNOSIS — Z0000 Encounter for general adult medical examination without abnormal findings: Secondary | ICD-10-CM

## 2013-08-22 LAB — CBC WITH DIFFERENTIAL/PLATELET
Basophils Absolute: 0 10*3/uL (ref 0.0–0.1)
Basophils Relative: 1 % (ref 0–1)
EOS ABS: 0.2 10*3/uL (ref 0.0–0.7)
EOS PCT: 2 % (ref 0–5)
HEMATOCRIT: 38.5 % (ref 36.0–46.0)
Hemoglobin: 13.2 g/dL (ref 12.0–15.0)
LYMPHS PCT: 19 % (ref 12–46)
Lymphs Abs: 1.6 10*3/uL (ref 0.7–4.0)
MCH: 30.6 pg (ref 26.0–34.0)
MCHC: 34.3 g/dL (ref 30.0–36.0)
MCV: 89.3 fL (ref 78.0–100.0)
MONOS PCT: 5 % (ref 3–12)
Monocytes Absolute: 0.4 10*3/uL (ref 0.1–1.0)
NEUTROS ABS: 6.3 10*3/uL (ref 1.7–7.7)
Neutrophils Relative %: 73 % (ref 43–77)
Platelets: 283 10*3/uL (ref 150–400)
RBC: 4.31 MIL/uL (ref 3.87–5.11)
RDW: 12.8 % (ref 11.5–15.5)
WBC: 8.5 10*3/uL (ref 4.0–10.5)

## 2013-08-22 LAB — BASIC METABOLIC PANEL
BUN: 19 mg/dL (ref 6–23)
CALCIUM: 9.3 mg/dL (ref 8.4–10.5)
CHLORIDE: 105 meq/L (ref 96–112)
CO2: 28 mEq/L (ref 19–32)
Creat: 0.69 mg/dL (ref 0.50–1.10)
GLUCOSE: 85 mg/dL (ref 70–99)
Potassium: 4.5 mEq/L (ref 3.5–5.3)
SODIUM: 140 meq/L (ref 135–145)

## 2013-08-22 LAB — HEPATIC FUNCTION PANEL
ALBUMIN: 3.9 g/dL (ref 3.5–5.2)
ALT: 17 U/L (ref 0–35)
AST: 15 U/L (ref 0–37)
Alkaline Phosphatase: 72 U/L (ref 39–117)
BILIRUBIN DIRECT: 0.1 mg/dL (ref 0.0–0.3)
Indirect Bilirubin: 0.3 mg/dL (ref 0.2–1.2)
TOTAL PROTEIN: 6.4 g/dL (ref 6.0–8.3)
Total Bilirubin: 0.4 mg/dL (ref 0.2–1.2)

## 2013-08-22 LAB — LIPID PANEL
CHOL/HDL RATIO: 3.1 ratio
CHOLESTEROL: 168 mg/dL (ref 0–200)
HDL: 54 mg/dL (ref >39)
LDL Cholesterol: 82 mg/dL (ref 0–99)
Triglycerides: 161 mg/dL — ABNORMAL HIGH (ref <150)
VLDL: 32 mg/dL (ref 0–40)

## 2013-08-22 NOTE — Telephone Encounter (Signed)
Lab orders entered/SLS 

## 2013-08-23 LAB — TSH: TSH: 1.194 u[IU]/mL (ref 0.350–4.500)

## 2013-08-29 ENCOUNTER — Encounter: Payer: Self-pay | Admitting: Family Medicine

## 2013-08-29 ENCOUNTER — Ambulatory Visit (INDEPENDENT_AMBULATORY_CARE_PROVIDER_SITE_OTHER): Payer: 59 | Admitting: Family Medicine

## 2013-08-29 VITALS — BP 118/72 | HR 82 | Temp 98.4°F | Ht 65.75 in | Wt 141.0 lb

## 2013-08-29 DIAGNOSIS — M722 Plantar fascial fibromatosis: Secondary | ICD-10-CM

## 2013-08-29 DIAGNOSIS — Z0000 Encounter for general adult medical examination without abnormal findings: Secondary | ICD-10-CM

## 2013-08-29 NOTE — Progress Notes (Signed)
Pre-visit discussion using our clinic review tool. No additional management support is needed unless otherwise documented below in the visit note.  

## 2013-08-29 NOTE — Patient Instructions (Signed)
Hypertriglyceridemia  Diet for High blood levels of Triglycerides Most fats in food are triglycerides. Triglycerides in your blood are stored as fat in your body. High levels of triglycerides in your blood may put you at a greater risk for heart disease and stroke.  Normal triglyceride levels are less than 150 mg/dL. Borderline high levels are 150-199 mg/dl. High levels are 200 - 499 mg/dL, and very high triglyceride levels are greater than 500 mg/dL. The decision to treat high triglycerides is generally based on the level. For people with borderline or high triglyceride levels, treatment includes weight loss and exercise. Drugs are recommended for people with very high triglyceride levels. Many people who need treatment for high triglyceride levels have metabolic syndrome. This syndrome is a collection of disorders that often include: insulin resistance, high blood pressure, blood clotting problems, high cholesterol and triglycerides. TESTING PROCEDURE FOR TRIGLYCERIDES  You should not eat 4 hours before getting your triglycerides measured. The normal range of triglycerides is between 10 and 250 milligrams per deciliter (mg/dl). Some people may have extreme levels (1000 or above), but your triglyceride level may be too high if it is above 150 mg/dl, depending on what other risk factors you have for heart disease.  People with high blood triglycerides may also have high blood cholesterol levels. If you have high blood cholesterol as well as high blood triglycerides, your risk for heart disease is probably greater than if you only had high triglycerides. High blood cholesterol is one of the main risk factors for heart disease. CHANGING YOUR DIET  Your weight can affect your blood triglyceride level. If you are more than 20% above your ideal body weight, you may be able to lower your blood triglycerides by losing weight. Eating less and exercising regularly is the best way to combat this. Fat provides more  calories than any other food. The best way to lose weight is to eat less fat. Only 30% of your total calories should come from fat. Less than 7% of your diet should come from saturated fat. A diet low in fat and saturated fat is the same as a diet to decrease blood cholesterol. By eating a diet lower in fat, you may lose weight, lower your blood cholesterol, and lower your blood triglyceride level.  Eating a diet low in fat, especially saturated fat, may also help you lower your blood triglyceride level. Ask your dietitian to help you figure how much fat you can eat based on the number of calories your caregiver has prescribed for you.  Exercise, in addition to helping with weight loss may also help lower triglyceride levels.   Alcohol can increase blood triglycerides. You may need to stop drinking alcoholic beverages.  Too much carbohydrate in your diet may also increase your blood triglycerides. Some complex carbohydrates are necessary in your diet. These may include bread, rice, potatoes, other starchy vegetables and cereals.  Reduce "simple" carbohydrates. These may include pure sugars, candy, honey, and jelly without losing other nutrients. If you have the kind of high blood triglycerides that is affected by the amount of carbohydrates in your diet, you will need to eat less sugar and less high-sugar foods. Your caregiver can help you with this.  Adding 2-4 grams of fish oil (EPA+ DHA) may also help lower triglycerides. Speak with your caregiver before adding any supplements to your regimen. Following the Diet  Maintain your ideal weight. Your caregivers can help you with a diet. Generally, eating less food and getting more   exercise will help you lose weight. Joining a weight control group may also help. Ask your caregivers for a good weight control group in your area.  Eat low-fat foods instead of high-fat foods. This can help you lose weight too.  These foods are lower in fat. Eat MORE of these:    Dried beans, peas, and lentils.  Egg whites.  Low-fat cottage cheese.  Fish.  Lean cuts of meat, such as round, sirloin, rump, and flank (cut extra fat off meat you fix).  Whole grain breads, cereals and pasta.  Skim and nonfat dry milk.  Low-fat yogurt.  Poultry without the skin.  Cheese made with skim or part-skim milk, such as mozzarella, parmesan, farmers', ricotta, or pot cheese. These are higher fat foods. Eat LESS of these:   Whole milk and foods made from whole milk, such as American, blue, cheddar, monterey jack, and swiss cheese  High-fat meats, such as luncheon meats, sausages, knockwurst, bratwurst, hot dogs, ribs, corned beef, ground pork, and regular ground beef.  Fried foods. Limit saturated fats in your diet. Substituting unsaturated fat for saturated fat may decrease your blood triglyceride level. You will need to read package labels to know which products contain saturated fats.  These foods are high in saturated fat. Eat LESS of these:   Fried pork skins.  Whole milk.  Skin and fat from poultry.  Palm oil.  Butter.  Shortening.  Cream cheese.  Bacon.  Margarines and baked goods made from listed oils.  Vegetable shortenings.  Chitterlings.  Fat from meats.  Coconut oil.  Palm kernel oil.  Lard.  Cream.  Sour cream.  Fatback.  Coffee whiteners and non-dairy creamers made with these oils.  Cheese made from whole milk. Use unsaturated fats (both polyunsaturated and monounsaturated) moderately. Remember, even though unsaturated fats are better than saturated fats; you still want a diet low in total fat.  These foods are high in unsaturated fat:   Canola oil.  Sunflower oil.  Mayonnaise.  Almonds.  Peanuts.  Pine nuts.  Margarines made with these oils.  Safflower oil.  Olive oil.  Avocados.  Cashews.  Peanut butter.  Sunflower seeds.  Soybean oil.  Peanut  oil.  Olives.  Pecans.  Walnuts.  Pumpkin seeds. Avoid sugar and other high-sugar foods. This will decrease carbohydrates without decreasing other nutrients. Sugar in your food goes rapidly to your blood. When there is excess sugar in your blood, your liver may use it to make more triglycerides. Sugar also contains calories without other important nutrients.  Eat LESS of these:   Sugar, brown sugar, powdered sugar, jam, jelly, preserves, honey, syrup, molasses, pies, candy, cakes, cookies, frosting, pastries, colas, soft drinks, punches, fruit drinks, and regular gelatin.  Avoid alcohol. Alcohol, even more than sugar, may increase blood triglycerides. In addition, alcohol is high in calories and low in nutrients. Ask for sparkling water, or a diet soft drink instead of an alcoholic beverage. Suggestions for planning and preparing meals   Bake, broil, grill or roast meats instead of frying.  Remove fat from meats and skin from poultry before cooking.  Add spices, herbs, lemon juice or vinegar to vegetables instead of salt, rich sauces or gravies.  Use a non-stick skillet without fat or use no-stick sprays.  Cool and refrigerate stews and broth. Then remove the hardened fat floating on the surface before serving.  Refrigerate meat drippings and skim off fat to make low-fat gravies.  Serve more fish.  Use less butter,   margarine and other high-fat spreads on bread or vegetables.  Use skim or reconstituted non-fat dry milk for cooking.  Cook with low-fat cheeses.  Substitute low-fat yogurt or cottage cheese for all or part of the sour cream in recipes for sauces, dips or congealed salads.  Use half yogurt/half mayonnaise in salad recipes.  Substitute evaporated skim milk for cream. Evaporated skim milk or reconstituted non-fat dry milk can be whipped and substituted for whipped cream in certain recipes.  Choose fresh fruits for dessert instead of high-fat foods such as pies or  cakes. Fruits are naturally low in fat. When Dining Out   Order low-fat appetizers such as fruit or vegetable juice, pasta with vegetables or tomato sauce.  Select clear, rather than cream soups.  Ask that dressings and gravies be served on the side. Then use less of them.  Order foods that are baked, broiled, poached, steamed, stir-fried, or roasted.  Ask for margarine instead of butter, and use only a small amount.  Drink sparkling water, unsweetened tea or coffee, or diet soft drinks instead of alcohol or other sweet beverages. QUESTIONS AND ANSWERS ABOUT OTHER FATS IN THE BLOOD: SATURATED FAT, TRANS FAT, AND CHOLESTEROL What is trans fat? Trans fat is a type of fat that is formed when vegetable oil is hardened through a process called hydrogenation. This process helps makes foods more solid, gives them shape, and prolongs their shelf life. Trans fats are also called hydrogenated or partially hydrogenated oils.  What do saturated fat, trans fat, and cholesterol in foods have to do with heart disease? Saturated fat, trans fat, and cholesterol in the diet all raise the level of LDL "bad" cholesterol in the blood. The higher the LDL cholesterol, the greater the risk for coronary heart disease (CHD). Saturated fat and trans fat raise LDL similarly.  What foods contain saturated fat, trans fat, and cholesterol? High amounts of saturated fat are found in animal products, such as fatty cuts of meat, chicken skin, and full-fat dairy products like butter, whole milk, cream, and cheese, and in tropical vegetable oils such as palm, palm kernel, and coconut oil. Trans fat is found in some of the same foods as saturated fat, such as vegetable shortening, some margarines (especially hard or stick margarine), crackers, cookies, baked goods, fried foods, salad dressings, and other processed foods made with partially hydrogenated vegetable oils. Small amounts of trans fat also occur naturally in some animal  products, such as milk products, beef, and lamb. Foods high in cholesterol include liver, other organ meats, egg yolks, shrimp, and full-fat dairy products. How can I use the new food label to make heart-healthy food choices? Check the Nutrition Facts panel of the food label. Choose foods lower in saturated fat, trans fat, and cholesterol. For saturated fat and cholesterol, you can also use the Percent Daily Value (%DV): 5% DV or less is low, and 20% DV or more is high. (There is no %DV for trans fat.) Use the Nutrition Facts panel to choose foods low in saturated fat and cholesterol, and if the trans fat is not listed, read the ingredients and limit products that list shortening or hydrogenated or partially hydrogenated vegetable oil, which tend to be high in trans fat. POINTS TO REMEMBER:   Discuss your risk for heart disease with your caregivers, and take steps to reduce risk factors.  Change your diet. Choose foods that are low in saturated fat, trans fat, and cholesterol.  Add exercise to your daily routine if   it is not already being done. Participate in physical activity of moderate intensity, like brisk walking, for at least 30 minutes on most, and preferably all days of the week. No time? Break the 30 minutes into three, 10-minute segments during the day.  Stop smoking. If you do smoke, contact your caregiver to discuss ways in which they can help you quit.  Do not use street drugs.  Maintain a normal weight.  Maintain a healthy blood pressure.  Keep up with your blood work for checking the fats in your blood as directed by your caregiver. Document Released: 04/14/2004 Document Revised: 12/27/2011 Document Reviewed: 11/10/2008 [employer] Patient Information 2014 [employer].  

## 2013-09-01 ENCOUNTER — Encounter: Payer: Self-pay | Admitting: Family Medicine

## 2013-09-01 NOTE — Progress Notes (Signed)
Patient ID: Jasmin Taylor, female   DOB: 07-21-1965, 48 y.o.   MRN: [id number] STUTI SANDIN [id number] [birth year] 09/01/2013      Progress Note-Follow Up  Subjective  Chief Complaint  Chief Complaint  Patient presents with  . Annual Exam    HPI  Patient is a 48 year old Caucasian female who is in today for annual exam. She feels well. She does have worsening of persistent heel pain. Despite trying to change her footwear, stretch and ice she has pain in her right heel with weightbearing most notably. It is worse in the morning. No anterior falls. Otherwise denies complaints. No chest pain, palpitations, shortness of breath, GI or GU concerns.  Past Medical History  Diagnosis Date  . Hyperlipidemia   . Preventative health care 08/22/2011  . Tinea pedis 12/02/2011  . Cerumen impaction 06/22/2012  . Dysmenorrhea   . Menorrhagia   . Fibroid   . Pain of right heel 07/05/2013  . Plantar fasciitis of right foot 07/05/2013    Past Surgical History  Procedure Laterality Date  . 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 Fibroidectomy    . Hysteroscopic resection  5/04    Family History  Problem Relation Age of Onset  . Diabetes Mother     type 2  . Heart attack Mother 53  . Hypertension Mother   . Hyperlipidemia Mother   . Stroke Mother     ministrokes  . Dementia Mother   . Heart disease Mother     MI 2005, s/p 3 stents  . Congestive Heart Failure Mother   . Heart attack Maternal Grandmother 67  . Diabetes Maternal Grandmother     type 2  . Hypertension Maternal Grandmother   . Hyperlipidemia Maternal Grandmother   . Stroke Maternal Grandfather   . Heart attack Paternal Grandfather   . Heart disease Paternal Grandfather     MI?  Marland Kitchen Hyperlipidemia Sister   . Diabetes Brother     type 2  . Alcohol abuse Brother   . Mental illness Brother     History   Social History  . Marital Status: Married    Spouse Name: N/A    Number  of Children: N/A  . Years of Education: N/A   Occupational History  . Not on file.   Social History Main Topics  . Smoking status: Never Smoker   . Smokeless tobacco: Never Used  . Alcohol Use: No  . Drug Use: No  . Sexual Activity: Yes    Partners: Male    Birth Control/ Protection: Pill     Comment: no dietary restrictions, lives with husband   Other Topics Concern  . Not on file   Social History Narrative  . No narrative on file    Current Outpatient Prescriptions on File Prior to Visit  Medication Sig Dispense Refill  . Calcium Carbonate-Vitamin D (CALTRATE 600+D) 600-400 MG-UNIT per tablet Take 1 tablet by mouth daily.      . MELOXICAM PO Take 1 tablet by mouth daily as needed.      . Multiple Vitamin (MULTIVITAMIN) tablet Take 1 tablet by mouth daily.      . NON FORMULARY daily. Omega red       . Norgestimate-Ethinyl Estradiol Triphasic (ORTHO TRI-CYCLEN LO) 0.18/0.215/0.25 MG-25 MCG tab Take 1 tablet by mouth daily.  3 Package  4   No current facility-administered medications on file prior to visit.  No Known Allergies  Review of Systems  Review of Systems  Constitutional: Negative for fever and malaise/fatigue.  HENT: Negative for congestion.   Eyes: Negative for discharge.  Respiratory: Negative for shortness of breath.   Cardiovascular: Negative for chest pain, palpitations and leg swelling.  Gastrointestinal: Negative for nausea, abdominal pain and diarrhea.  Genitourinary: Negative for dysuria.  Musculoskeletal: Positive for joint pain. Negative for falls.       Heel pain noted worse with standing on it.   Skin: Negative for rash.  Neurological: Negative for loss of consciousness and headaches.  Endo/Heme/Allergies: Negative for polydipsia.  Psychiatric/Behavioral: Negative for depression and suicidal ideas. The patient is not nervous/anxious and does not have insomnia.     Objective  BP 118/72  Pulse 82  Temp(Src) 98.4 F (36.9 C) (Oral)  Ht  5' 5.75" (1.67 m)  Wt 141 lb (63.957 kg)  BMI 22.93 kg/m2  SpO2 97%  Physical Exam  Physical Exam  Constitutional: She is oriented to person, place, and time and well-developed, well-nourished, and in no distress. No distress.  HENT:  Head: Normocephalic and atraumatic.  Eyes: Conjunctivae are normal.  Neck: Neck supple. No thyromegaly present.  Cardiovascular: Normal rate, regular rhythm and normal heart sounds.   No murmur heard. Pulmonary/Chest: Effort normal and breath sounds normal. She has no wheezes.  Abdominal: Soft. Bowel sounds are normal. She exhibits no distension and no mass. There is no tenderness. There is no rebound and no guarding.  Musculoskeletal: Normal range of motion. She exhibits no edema and no tenderness.  Lymphadenopathy:    She has no cervical adenopathy.  Neurological: She is alert and oriented to person, place, and time.  Skin: Skin is warm and dry. No rash noted. She is not diaphoretic.  Psychiatric: Memory, affect and judgment normal.    Lab Results  Component Value Date   TSH 1.194 08/22/2013   Lab Results  Component Value Date   WBC 8.5 08/22/2013   HGB 13.2 08/22/2013   HCT 38.5 08/22/2013   MCV 89.3 08/22/2013   PLT 283 08/22/2013   Lab Results  Component Value Date   CREATININE 0.69 08/22/2013   BUN 19 08/22/2013   NA 140 08/22/2013   K 4.5 08/22/2013   CL 105 08/22/2013   CO2 28 08/22/2013   Lab Results  Component Value Date   ALT 17 08/22/2013   AST 15 08/22/2013   ALKPHOS 72 08/22/2013   BILITOT 0.4 08/22/2013   Lab Results  Component Value Date   CHOL 168 08/22/2013   Lab Results  Component Value Date   HDL 54 08/22/2013   Lab Results  Component Value Date   LDLCALC 82 08/22/2013   Lab Results  Component Value Date   TRIG 161* 08/22/2013   Lab Results  Component Value Date   CHOLHDL 3.1 08/22/2013     Assessment & Plan  Preventative health care Encouraged hear healthy diet, regular exercise and adequate sleep. Reviewed  annual labs  Plantar fasciitis of right foot Has failed conservative management with ice, stretching and NSAIDs, referred to sports med for further consideration.

## 2013-09-01 NOTE — Assessment & Plan Note (Signed)
Encouraged hear healthy diet, regular exercise and adequate sleep. Reviewed annual labs

## 2013-09-01 NOTE — Assessment & Plan Note (Signed)
Has failed conservative management with ice, stretching and NSAIDs, referred to sports med for further consideration.

## 2013-09-09 ENCOUNTER — Encounter: Payer: Self-pay | Admitting: Family Medicine

## 2013-09-09 ENCOUNTER — Other Ambulatory Visit (INDEPENDENT_AMBULATORY_CARE_PROVIDER_SITE_OTHER): Payer: 59

## 2013-09-09 ENCOUNTER — Ambulatory Visit (INDEPENDENT_AMBULATORY_CARE_PROVIDER_SITE_OTHER): Payer: 59 | Admitting: Family Medicine

## 2013-09-09 VITALS — BP 114/60 | HR 74 | Temp 99.4°F | Resp 16 | Wt 140.0 lb

## 2013-09-09 DIAGNOSIS — M629 Disorder of muscle, unspecified: Secondary | ICD-10-CM | POA: Insufficient documentation

## 2013-09-09 DIAGNOSIS — M2420 Disorder of ligament, unspecified site: Secondary | ICD-10-CM

## 2013-09-09 DIAGNOSIS — M722 Plantar fascial fibromatosis: Secondary | ICD-10-CM

## 2013-09-09 MED ORDER — NITROGLYCERIN 0.2 MG/HR TD PT24: MEDICATED_PATCH | TRANSDERMAL | Status: DC

## 2013-09-09 NOTE — Patient Instructions (Signed)
Very nice to meet you.  Ice bath 20 minutes 1-2 times daily.  Meloxicam daily for 10 days then as needed Spenoc orthotics at [employer] [facility] Exercises most days of the week. Look at sheet On stair heel drop then lift on to toes hold 2 seconds down slow for count of 4 seconds and repeat, 30 reps 1 set daily first week, 2 sets second week three sets daily thereafter.  Walk on toes across room forward and backward 10 x daily. Walk on heel across room forward 10 x Otherwise no barefoot.  Jalene Mullet or [place] ar my favorite.  Looking for rigid bottom shoe.  Spenco orthotics at [facility] would help Nitroglycerin Protocol   Apply 1/4 nitroglycerin patch to affected area nightly  Change position of patch within the affected area every 24 hours.  You may experience a headache during the first 1-2 weeks of using the patch, these should subside.  If you experience headaches after beginning nitroglycerin patch treatment, you may take your preferred over the counter pain reliever.  Another side effect of the nitroglycerin patch is skin irritation or rash related to patch adhesive.  Please notify our office if you develop more severe headaches or rash, and stop the patch.  Tendon healing with nitroglycerin patch may require 12 to 24 weeks depending on the extent of injury.  Men should not use if taking Viagra, Cialis, or Levitra.   Do not use if you have migraines or rosacea.   Come back and see me again in 3-4 weeks.

## 2013-09-09 NOTE — Progress Notes (Signed)
  [facility] [address], [place] [address] Phone: [phone] Subjective:    [name] by the request  of:  Penni Homans, MD   CC: bilateral foot pain.   GBT:[id number] Jasmin Taylor is a 48 y.o. female coming in with complaint of bilateral foot pain right greater than left. Patient states that the right side has been going on for multiple months. Patient has been trying some stretching, changing shoe wear, icing protocol continues to have pain in the right heel with weightbearing. States that it is worse in the morning. Denies any true injury to this area. Patient denies any radiation or any numbness of the toes. Patient is becoming very frustrated because it is starting to affect her daily activities. Patient states that it does not wake her up at night. Patient was in severity at 8/10. She does tried oral anti-inflammatories without any significant improvement.     Past medical history, social, surgical and family history all reviewed in electronic medical record.   Review of Systems: No headache, visual changes, nausea, vomiting, diarrhea, constipation, dizziness, abdominal pain, skin rash, fevers, chills, night sweats, weight loss, swollen lymph nodes, body aches, joint swelling, muscle aches, chest pain, shortness of breath, mood changes.   Objective Blood pressure 114/60, pulse 74, temperature 99.4 F (37.4 C), temperature source Oral, resp. rate 16, weight 140 lb 0.6 oz (63.522 kg), SpO2 98.00%.  General: No apparent distress alert and oriented x3 mood and affect normal, dressed appropriately.  HEENT: Pupils equal, extraocular movements intact  Respiratory: Patient's speak in full sentences and does not appear short of breath  Cardiovascular: No lower extremity edema, non tender, no erythema  Skin: Warm dry intact with no signs of infection or rash on extremities or on axial skeleton.  Abdomen: Soft nontender  Neuro:  Cranial nerves II through XII are intact, neurovascularly intact in all extremities with 2+ DTRs and 2+ pulses.  Lymph: No lymphadenopathy of posterior or anterior cervical chain or axillae bilaterally.  Gait normal with good balance and coordination.  MSK:  Non tender with full range of motion and good stability and symmetric strength and tone of shoulders, elbows, wrist, hip, knee and ankles bilaterally.  Normal inspection with no visable or palpable fat pad atrophy and no visible swelling/erythema. Foot exam bilaterally Patient is tender at medial insertion of plantar fascia into calcaneus. Great toe motion: Mild limitus with some fibular deviation Arch shape: Pes cavus Other foot breakdown: Mild breakdown of the transverse arch  MSK US performed of: Right ankle This study was ordered, performed, and interpreted by Charlann Boxer D.O.  Foot/Ankle:  Right All structures visualized.   Talar dome unremarkable  Ankle mortise without effusion. Peroneus longus and brevis tendons unremarkable on long and transverse views without sheath effusions. Posterior tibialis, flexor hallucis longus, and flexor digitorum longus tendons unremarkable on long and transverse views without sheath effusions. Achilles tendon visualized along length of tendon and unremarkable on long and transverse views without sheath effusion. Anterior Talofibular Ligament and Calcaneofibular Ligaments unremarkable and intact. Deltoid Ligament unremarkable and intact. Plantar fascia i chose a there is a tear possibly 1 cm from origin as well as increased size of 1.05 cm. Power doppler signal normal.  IMPRESSION:  Plantar fasciitis with tear     Impression and Recommendations:     This case required medical decision making of moderate complexity.

## 2013-09-09 NOTE — Assessment & Plan Note (Addendum)
As the patient at great length. We discussed diagnosis, prognosis, and rehabilitation. Home exercise program given, meloxicam daily for 10 days, and we will do nitroglycerin patch secondary to the longevity of the symptoms and the tear that is noted. We discussed icing protocol as well. Discussed proper shoe choices and over-the-counter orthotics. Patient back in 3 weeks for further evaluation. Continuing to have pain we may need to consider an injection. Patient may need custom orthotics in the long run as well.

## 2013-09-09 NOTE — Progress Notes (Signed)
Pre visit review using our clinic review tool, if applicable. No additional management support is needed unless otherwise documented below in the visit note. 

## 2013-09-30 ENCOUNTER — Ambulatory Visit: Payer: 59 | Admitting: Family Medicine

## 2013-10-14 ENCOUNTER — Other Ambulatory Visit (INDEPENDENT_AMBULATORY_CARE_PROVIDER_SITE_OTHER): Payer: 59

## 2013-10-14 ENCOUNTER — Ambulatory Visit (INDEPENDENT_AMBULATORY_CARE_PROVIDER_SITE_OTHER): Payer: 59 | Admitting: Family Medicine

## 2013-10-14 ENCOUNTER — Encounter: Payer: Self-pay | Admitting: Family Medicine

## 2013-10-14 VITALS — BP 122/78 | HR 73

## 2013-10-14 DIAGNOSIS — M722 Plantar fascial fibromatosis: Secondary | ICD-10-CM

## 2013-10-14 DIAGNOSIS — M629 Disorder of muscle, unspecified: Secondary | ICD-10-CM

## 2013-10-14 DIAGNOSIS — M2420 Disorder of ligament, unspecified site: Secondary | ICD-10-CM

## 2013-10-14 NOTE — Assessment & Plan Note (Signed)
Spent greater than 25 minutes with patient face-to-face and had greater than 50% of counseling including as described above in assessment and plan.  Patient is doing considerably better. Patient was given strengthening exercises and handout formation today. Discuss continuing the home exercises on her regular basis as well as discussed the importance of icing. Patient will do the nitroglycerin for another 3 weeks and then titrate down over the course of the next 3 weeks. Patient come back in 6 weeks for further evaluation. She continues to have problems at that time we need to consider formal physical therapy or injection.

## 2013-10-14 NOTE — Patient Instructions (Signed)
It is good to see you You are doing great.  Try icing at end of the day one way or another Continue exercises at least 3 times a week.  Stretch whenever you can.  Continue the nitro daily for another 3 weeks then go to 3 times a week for 2 weeks then stop for a week Come back in 6 weeks to make sure this has mostly resolved.

## 2013-10-14 NOTE — Progress Notes (Signed)
  [facility] [address] [place], [place] [address] Phone: [phone] Subjective:      CC: bilateral foot pain follow up.   [id number] Jasmin Taylor is a 48 y.o. female coming back for bilateral heel pain. Patient was diagnosed previously with plantar fasciitis with right being greater than left. Patient did have a plantar fascia tear noted last time and was started on nitroglycerin patches. Patient states that she's been doing the exercises intermittently and has not been icing because of the pain that is involved. Patient states that the pain is 60% better. Patient to make up her daily activities without any significant side effects. Denies any new symptoms. He should has gotten the over-the-counter orthotics which has been helpful as well. Patient states that she is feeling better overall.     Past medical history, social, surgical and family history all reviewed in electronic medical record.   Review of Systems: No headache, visual changes, nausea, vomiting, diarrhea, constipation, dizziness, abdominal pain, skin rash, fevers, chills, night sweats, weight loss, swollen lymph nodes, body aches, joint swelling, muscle aches, chest pain, shortness of breath, mood changes.   Objective Blood pressure 122/78, pulse 73, SpO2 99.00%.  General: No apparent distress alert and oriented x3 mood and affect normal, dressed appropriately.  HEENT: Pupils equal, extraocular movements intact  Respiratory: Patient's speak in full sentences and does not appear short of breath  Cardiovascular: No lower extremity edema, non tender, no erythema  Skin: Warm dry intact with no signs of infection or rash on extremities or on axial skeleton.  Abdomen: Soft nontender  Neuro: Cranial nerves II through XII are intact, neurovascularly intact in all extremities with 2+ DTRs and 2+ pulses.  Lymph: No lymphadenopathy of posterior or anterior cervical chain or axillae bilaterally.   Gait normal with good balance and coordination.  MSK:  Non tender with full range of motion and good stability and symmetric strength and tone of shoulders, elbows, wrist, hip, knee and ankles bilaterally.  Normal inspection with no visable or palpable fat pad atrophy and no visible swelling/erythema. Foot exam bilaterally Patient is nontender on exam Great toe motion: Mild limitus with some fibular deviation Arch shape: Pes cavus Other foot breakdown: Mild breakdown of the transverse arch  MSK US performed of: Right ankle This study was ordered, performed, and interpreted by Charlann Boxer D.O.  Foot/Ankle:  Right All structures visualized.   Talar dome unremarkable  Ankle mortise without effusion. Peroneus longus and brevis tendons unremarkable on long and transverse views without sheath effusions. Posterior tibialis, flexor hallucis longus, and flexor digitorum longus tendons unremarkable on long and transverse views without sheath effusions. Achilles tendon visualized along length of tendon and unremarkable on long and transverse views without sheath effusion. Anterior Talofibular Ligament and Calcaneofibular Ligaments unremarkable and intact. Deltoid Ligament unremarkable and intact. The patient's tear is remarkably smaller than previously was in only measures 0.71 cm with significant decrease in hypoechoic changes. Power doppler signal normal.  IMPRESSION:  Significant improvement with plantar fascia     Impression and Recommendations:     This case required medical decision making of moderate complexity.

## 2013-11-07 ENCOUNTER — Encounter: Payer: Self-pay | Admitting: Family Medicine

## 2013-11-21 ENCOUNTER — Telehealth: Payer: Self-pay | Admitting: Family Medicine

## 2013-11-21 NOTE — Telephone Encounter (Signed)
Patient states she saw Dr. Tamala Julian on Monday, DX: plantar faciitis.  She has since done lots of walking and pain is worse.  Should she come in for an appt now or wait to see if she gets better?

## 2013-11-21 NOTE — Telephone Encounter (Signed)
Discussed with pt

## 2013-11-25 ENCOUNTER — Ambulatory Visit: Payer: 59 | Admitting: Family Medicine

## 2013-12-25 ENCOUNTER — Encounter: Payer: Self-pay | Admitting: Family Medicine

## 2013-12-25 ENCOUNTER — Encounter: Payer: Self-pay | Admitting: *Deleted

## 2013-12-25 ENCOUNTER — Ambulatory Visit (INDEPENDENT_AMBULATORY_CARE_PROVIDER_SITE_OTHER): Payer: 59 | Admitting: Family Medicine

## 2013-12-25 VITALS — BP 112/80 | HR 66 | Ht 65.5 in | Wt 139.0 lb

## 2013-12-25 DIAGNOSIS — M2420 Disorder of ligament, unspecified site: Secondary | ICD-10-CM

## 2013-12-25 DIAGNOSIS — M722 Plantar fascial fibromatosis: Secondary | ICD-10-CM

## 2013-12-25 DIAGNOSIS — M629 Disorder of muscle, unspecified: Secondary | ICD-10-CM

## 2013-12-25 NOTE — Patient Instructions (Signed)
Good to see you [facility] will be calling you Wear the good shoes with working Continue the exercises and the nitro.  Try the new brace with walking See you again in 4 weeks.

## 2013-12-25 NOTE — Assessment & Plan Note (Signed)
Discussed with patient for quite some time. Patient was making improvement and now is having some worsening. Patient has been walking much more. Patient is to walk and better shoes at work and was given note specifying. In addition this patient was given a new brace with a pneumatic compression to help with the heel. We discussed continuing the nitroglycerin or discontinuing of the patient. We also discussed formal physical therapy and referral was placed. Patient showed proper technique for multiple exercises. Patient come back again in 4 weeks for further evaluation.  Spent greater than 25 minutes with patient face-to-face and had greater than 50% of counseling including as described above in assessment and plan.

## 2013-12-25 NOTE — Progress Notes (Signed)
  [facility] [place], [place] [address] Phone: [phone] Subjective:      CC: bilateral foot pain follow up.   [id number] Jasmin Taylor is a 48 y.o. female coming back for bilateral heel pain. Patient was diagnosed previously with plantar fasciitis.  Patient 2 months ago was doing significantly better. Patient states though since that time it is 30 get worse again. Especially on the right foot. Patient states that when she is working after working seems to be significantly more painful. Patient continues and nitroglycerin without any significant improvement. Patient denies any side effects. Patient patient though would like to be pain free of course. Patient denies any new symptoms except that he can even hurt with walking sometimes now.     Past medical history, social, surgical and family history all reviewed in electronic medical record.   Review of Systems: No headache, visual changes, nausea, vomiting, diarrhea, constipation, dizziness, abdominal pain, skin rash, fevers, chills, night sweats, weight loss, swollen lymph nodes, body aches, joint swelling, muscle aches, chest pain, shortness of breath, mood changes.   Objective Blood pressure 112/80, pulse 66, height 5' 5.5" (1.664 m), weight 139 lb (63.05 kg), SpO2 98.00%.  General: No apparent distress alert and oriented x3 mood and affect normal, dressed appropriately.  HEENT: Pupils equal, extraocular movements intact  Respiratory: Patient's speak in full sentences and does not appear short of breath  Cardiovascular: No lower extremity edema, non tender, no erythema  Skin: Warm dry intact with no signs of infection or rash on extremities or on axial skeleton.  Abdomen: Soft nontender  Neuro: Cranial nerves II through XII are intact, neurovascularly intact in all extremities with 2+ DTRs and 2+ pulses.  Lymph: No lymphadenopathy of posterior or anterior cervical chain or axillae  bilaterally.  Gait normal with good balance and coordination.  MSK:  Non tender with full range of motion and good stability and symmetric strength and tone of shoulders, elbows, wrist, hip, knee and ankles bilaterally.  Normal inspection with no visable or palpable fat pad atrophy and no visible swelling/erythema. Foot exam bilaterally Patient is nontender on exam Great toe motion: Mild limitus Arch shape: Pes cavus Other foot breakdown: Mild breakdown of the transverse arch  MSK US performed of: Right ankle This study was ordered, performed, and interpreted by Charlann Boxer D.O.  Foot/Ankle:  Right All structures visualized.   Talar dome unremarkable  Ankle mortise without effusion. Peroneus longus and brevis tendons unremarkable on long and transverse views without sheath effusions. Posterior tibialis, flexor hallucis longus, and flexor digitorum longus tendons unremarkable on long and transverse views without sheath effusions. Achilles tendon visualized along length of tendon and unremarkable on long and transverse views without sheath effusion. Anterior Talofibular Ligament and Calcaneofibular Ligaments unremarkable and intact. Deltoid Ligament unremarkable and intact. The patient's tear is remarkably smaller than previously was in only measures 0.61 cm with no significant hypoechoic changes. Power doppler signal normal.  IMPRESSION:  Significant improvement with plantar fascia     Impression and Recommendations:     This case required medical decision making of moderate complexity.

## 2013-12-27 ENCOUNTER — Telehealth: Payer: Self-pay | Admitting: Family Medicine

## 2013-12-27 ENCOUNTER — Encounter: Payer: Self-pay | Admitting: *Deleted

## 2013-12-27 NOTE — Telephone Encounter (Signed)
Pt request phone call from the assistant concern about the shoes, pt request Dr. Tamala Julian to give her Rx for the black shoes so her insurance can cover it.

## 2013-12-27 NOTE — Telephone Encounter (Signed)
Discussed with pt. Letter printed & pt will pick up Monday.

## 2014-01-24 ENCOUNTER — Ambulatory Visit: Payer: 59 | Admitting: Family Medicine

## 2014-01-27 ENCOUNTER — Other Ambulatory Visit (INDEPENDENT_AMBULATORY_CARE_PROVIDER_SITE_OTHER): Payer: 59

## 2014-01-27 ENCOUNTER — Encounter: Payer: Self-pay | Admitting: *Deleted

## 2014-01-27 ENCOUNTER — Ambulatory Visit (INDEPENDENT_AMBULATORY_CARE_PROVIDER_SITE_OTHER): Payer: 59 | Admitting: Family Medicine

## 2014-01-27 ENCOUNTER — Encounter: Payer: Self-pay | Admitting: Family Medicine

## 2014-01-27 VITALS — BP 112/82 | HR 64 | Ht 65.5 in | Wt 139.0 lb

## 2014-01-27 DIAGNOSIS — M722 Plantar fascial fibromatosis: Secondary | ICD-10-CM

## 2014-01-27 NOTE — Assessment & Plan Note (Signed)
Patient was given injection today and did state that it did feel somewhat better. We discussed icing protocol that will be beneficial. We discussed continuing home exercise as well as the bracing the patient was given previously. Patient did get new shoes I think would be helpful as well. Patient will followup with me and can in 4-6 weeks for further evaluation and treatment.  Spent greater than 25 minutes with patient face-to-face and had greater than 50% of counseling including as described above in assessment and plan.

## 2014-01-27 NOTE — Progress Notes (Signed)
[facility] [place], [place] [address] Phone: [phone] Subjective:      CC: bilateral foot pain follow up.   [id number] Jasmin Taylor is a 48 y.o. female coming back for bilateral heel pain. Patient was diagnosed previously with plantar fasciitis.  Patient has been doing physical therapy and has stopped the nitroglycerin patch. Patient continues to try to do the home exercises. Patient has gotten new shoes as well as over-the-counter orthotics with some mild improvement. Overall patient has made no significant improve and she states. She states that it continues to give her a dull aching pain even with walking. Patient continues to work but finds it difficult. Patient is done all conservative therapy at this time with no significant improvement..     Past medical history, social, surgical and family history all reviewed in electronic medical record.   Review of Systems: No headache, visual changes, nausea, vomiting, diarrhea, constipation, dizziness, abdominal pain, skin rash, fevers, chills, night sweats, weight loss, swollen lymph nodes, body aches, joint swelling, muscle aches, chest pain, shortness of breath, mood changes.   Objective Blood pressure 112/82, pulse 64, height 5' 5.5" (1.664 m), weight 139 lb (63.05 kg), SpO2 99.00%.  General: No apparent distress alert and oriented x3 mood and affect normal, dressed appropriately.  HEENT: Pupils equal, extraocular movements intact  Respiratory: Patient's speak in full sentences and does not appear short of breath  Cardiovascular: No lower extremity edema, non tender, no erythema  Skin: Warm dry intact with no signs of infection or rash on extremities or on axial skeleton.  Abdomen: Soft nontender  Neuro: Cranial nerves II through XII are intact, neurovascularly intact in all extremities with 2+ DTRs and 2+ pulses.  Lymph: No lymphadenopathy of posterior or anterior cervical chain or  axillae bilaterally.  Gait normal with good balance and coordination.  MSK:  Non tender with full range of motion and good stability and symmetric strength and tone of shoulders, elbows, wrist, hip, knee and ankles bilaterally.  Normal inspection with no visable or palpable fat pad atrophy and no visible swelling/erythema. Foot exam bilaterally Patient is again tender to palpation over the medial calcaneal region Great toe motion: Mild limitus Arch shape: Pes cavus Other foot breakdown: Mild breakdown of the transverse arch  MSK US performed of: Right ankle This study was ordered, performed, and interpreted by Charlann Boxer D.O.  Foot/Ankle:  Right All structures visualized.   Talar dome unremarkable  Ankle mortise without effusion. Peroneus longus and brevis tendons unremarkable on long and transverse views without sheath effusions. Posterior tibialis, flexor hallucis longus, and flexor digitorum longus tendons unremarkable on long and transverse views without sheath effusions. Achilles tendon visualized along length of tendon and unremarkable on long and transverse views without sheath effusion. Anterior Talofibular Ligament and Calcaneofibular Ligaments unremarkable and intact. Deltoid Ligament unremarkable and intact. The patient's tear is remarkably smaller than previously was in only measures 1.21 cm with increasing hypoechoic changes from previous exam. IMPRESSION:  Significant improvement with plantar fascia   Procedure note After verbal consent patient was prepped with alcohol swabs and from a medial approach under ultrasound guidance patient was injected with 1 cc of 0.5% Marcaine and 1 cc of Kenalog 40 mg/dL into the medial calcaneal area at the origin of the plantar fascia. Patient tolerated the procedure well and post injection instructions given.   Impression: Successful ultrasound-guided injection.     Impression and Recommendations:     This  case required medical  decision making of moderate complexity.

## 2014-01-27 NOTE — Patient Instructions (Signed)
Good to see you Continue the exercises and I love the shoes Return to work on Thursday.  Ice is still your best friend See you in 3-4 weeks

## 2014-02-24 ENCOUNTER — Encounter: Payer: Self-pay | Admitting: Family Medicine

## 2014-02-24 ENCOUNTER — Ambulatory Visit (INDEPENDENT_AMBULATORY_CARE_PROVIDER_SITE_OTHER): Payer: 59 | Admitting: Family Medicine

## 2014-02-24 VITALS — BP 122/80 | HR 76 | Ht 65.5 in | Wt 141.0 lb

## 2014-02-24 DIAGNOSIS — M722 Plantar fascial fibromatosis: Secondary | ICD-10-CM

## 2014-02-24 NOTE — Patient Instructions (Signed)
It is good to see you I am glad you are doing better.  Continue the exercises 3 times a week.  Ice is your friend at the end of the day.  See me when you need me.

## 2014-02-24 NOTE — Assessment & Plan Note (Addendum)
Patient is doing significantly better one month after the steroid injection. I am encouraged that she continue. Patient will continue the exercises as well as the icing. Patient is wearing good shoes which I think will be beneficial. I do think patient is a candidate for custom orthotics secondary to her employment. Patient will come back and see me again hopefully soon and we will consider custom orthotics.

## 2014-02-24 NOTE — Progress Notes (Signed)
  [facility] [address] [place], [place] [address] Phone: [phone] Subjective:      CC: bilateral foot pain follow up.   [id number] Jasmin Taylor is a 48 y.o. female coming back for bilateral heel pain. Patient was diagnosed previously with plantar fasciitis.  Patient has been doing physical therapy and has stopped the nitroglycerin patch.  At last visit patient did have injection. Patient continues to try to do the home exercises. Patient has gotten new shoes as well as over-the-counter orthotics.  Patient states since last injection doing significantly better. Patient states that she can make it through all day without any significant pain. Denies any new symptoms. Denies any nighttime awakening. Only a dull aching sensation if she does a significant amount of walking.     Past medical history, social, surgical and family history all reviewed in electronic medical record.   Review of Systems: No headache, visual changes, nausea, vomiting, diarrhea, constipation, dizziness, abdominal pain, skin rash, fevers, chills, night sweats, weight loss, swollen lymph nodes, body aches, joint swelling, muscle aches, chest pain, shortness of breath, mood changes.   Objective Blood pressure 122/80, pulse 76, height 5' 5.5" (1.664 m), weight 141 lb (63.957 kg), SpO2 98.00%.  General: No apparent distress alert and oriented x3 mood and affect normal, dressed appropriately.  HEENT: Pupils equal, extraocular movements intact  Respiratory: Patient's speak in full sentences and does not appear short of breath  Cardiovascular: No lower extremity edema, non tender, no erythema  Skin: Warm dry intact with no signs of infection or rash on extremities or on axial skeleton.  Abdomen: Soft nontender  Neuro: Cranial nerves II through XII are intact, neurovascularly intact in all extremities with 2+ DTRs and 2+ pulses.  Lymph: No lymphadenopathy of posterior or anterior  cervical chain or axillae bilaterally.  Gait normal with good balance and coordination.  MSK:  Non tender with full range of motion and good stability and symmetric strength and tone of shoulders, elbows, wrist, hip, knee and ankles bilaterally.  Normal inspection with no visable or palpable fat pad atrophy and no visible swelling/erythema. Foot exam bilaterally Nontender  Great toe motion: Mild limitus Arch shape: Pes cavus Other foot breakdown: Mild breakdown of the transverse arch  MSK US performed of: Right ankle This study was ordered, performed, and interpreted by Charlann Boxer D.O.  Foot/Ankle:  Right All structures visualized.   Talar dome unremarkable  Ankle mortise without effusion. Peroneus longus and brevis tendons unremarkable on long and transverse views without sheath effusions. Posterior tibialis, flexor hallucis longus, and flexor digitorum longus tendons unremarkable on long and transverse views without sheath effusions. Achilles tendon visualized along length of tendon and unremarkable on long and transverse views without sheath effusion. Anterior Talofibular Ligament and Calcaneofibular Ligaments unremarkable and intact. Deltoid Ligament unremarkable and intact. The plantar fascia only measures 0.81 from 1.21 cm with increasing hypoechoic changes from previous exam. IMPRESSION:  Significant improvement with plantar fascia, near normal level       Impression and Recommendations:     This case required medical decision making of moderate complexity.

## 2014-03-03 ENCOUNTER — Encounter: Payer: Self-pay | Admitting: Certified Nurse Midwife

## 2014-03-06 ENCOUNTER — Encounter: Payer: Self-pay | Admitting: Family Medicine

## 2014-03-06 ENCOUNTER — Ambulatory Visit: Payer: 59 | Admitting: Certified Nurse Midwife

## 2014-03-06 ENCOUNTER — Ambulatory Visit (INDEPENDENT_AMBULATORY_CARE_PROVIDER_SITE_OTHER): Payer: 59 | Admitting: Family Medicine

## 2014-03-06 VITALS — BP 130/84 | HR 64 | Ht 65.5 in | Wt 141.0 lb

## 2014-03-06 DIAGNOSIS — M629 Disorder of muscle, unspecified: Secondary | ICD-10-CM

## 2014-03-06 NOTE — Assessment & Plan Note (Signed)
Patient has had severe plantar fasciitis and dramatic tear previously. I do believe that patient has exhausted all other conservative therapy and orthotics were necessary. Patient had these me today and we discussed in great detail about how to slowly wear these. We wake up with him in shoes. Patient will continue with home exercises, continue with nitroglycerin. Patient will come back again in 3 weeks for further evaluation.  I spent 45 minutes with this patient, greater than 50% was face-to-face time counseling regarding the below diagnosis.

## 2014-03-06 NOTE — Patient Instructions (Signed)
Good to see you Ice is still your friend.  When you get the orthotics wear them for 2 hours the first day and then increase 1 houra day for the next week until full day.   If need adjustments please call  Otherwise see me in 3 weeks or when you need me.

## 2014-03-06 NOTE — Progress Notes (Signed)
  [facility] [place], [place] [address] Phone: [phone] Subjective:      CC: bilateral foot pain follow up.   [id number] Jasmin Taylor is a 48 y.o. female coming back for bilateral heel pain. Patient was diagnosed previously with plantar fasciitis.  Patient has been doing physical therapy and has stopped the nitroglycerin patch.  At last visit patient did have injection. Patient continues to try to do the home exercises. Patient has gotten new shoes as well as over-the-counter orthotics. Patient does notice that the pain is starting to come back even though now she is done all conservative therapy. Patient states it is more of a dull aching pain is not as severe as it was previously. Patient is able to weight workup it is felt the patient come back and more severity.    Past medical history, social, surgical and family history all reviewed in electronic medical record.   Review of Systems: No headache, visual changes, nausea, vomiting, diarrhea, constipation, dizziness, abdominal pain, skin rash, fevers, chills, night sweats, weight loss, swollen lymph nodes, body aches, joint swelling, muscle aches, chest pain, shortness of breath, mood changes.   Objective Blood pressure 130/84, pulse 64, height 5' 5.5" (1.664 m), weight 141 lb (63.957 kg), SpO2 98.00%.  General: No apparent distress alert and oriented x3 mood and affect normal, dressed appropriately.  HEENT: Pupils equal, extraocular movements intact  Respiratory: Patient's speak in full sentences and does not appear short of breath  Cardiovascular: No lower extremity edema, non tender, no erythema  Skin: Warm dry intact with no signs of infection or rash on extremities or on axial skeleton.  Abdomen: Soft nontender  Neuro: Cranial nerves II through XII are intact, neurovascularly intact in all extremities with 2+ DTRs and 2+ pulses.  Lymph: No lymphadenopathy of posterior or anterior  cervical chain or axillae bilaterally.  Gait normal with good balance and coordination.  MSK:  Non tender with full range of motion and good stability and symmetric strength and tone of shoulders, elbows, wrist, hip, knee and ankles bilaterally.  Normal inspection with no visable or palpable fat pad atrophy and no visible swelling/erythema. Foot exam bilaterally Nontender  Great toe motion: Mild limitus Arch shape: Pes cavus Other foot breakdown: Mild breakdown of the transverse arch   Procedure Patient was fitted for a : standard, cushioned, semi-rigid orthotic. The orthotic was heated and afterward the patient stood on the orthotic blank positioned on the orthotic stand. The patient was positioned in subtalar neutral position and 10 degrees of ankle dorsiflexion in a weight bearing stance. After completion of molding, a stable base was applied to the orthotic blank. The blank was ground to a stable position for weight bearing. Size: 8 Base: Blue EVA Additional Posting and Padding: None The patient ambulated these, and they were very comfortable.  I spent 45 minutes with this patient, greater than 50% was face-to-face time counseling regarding the below diagnosis.    Impression and Recommendations:

## 2014-03-07 ENCOUNTER — Ambulatory Visit: Payer: 59 | Admitting: Family Medicine

## 2014-03-18 ENCOUNTER — Ambulatory Visit: Payer: 59 | Admitting: Certified Nurse Midwife

## 2014-03-21 ENCOUNTER — Ambulatory Visit: Payer: 59 | Admitting: Certified Nurse Midwife

## 2014-03-25 ENCOUNTER — Ambulatory Visit (INDEPENDENT_AMBULATORY_CARE_PROVIDER_SITE_OTHER): Payer: 59 | Admitting: Certified Nurse Midwife

## 2014-03-25 ENCOUNTER — Encounter: Payer: Self-pay | Admitting: Certified Nurse Midwife

## 2014-03-25 VITALS — BP 110/68 | HR 68 | Resp 16 | Ht 65.75 in | Wt 140.0 lb

## 2014-03-25 DIAGNOSIS — Z01419 Encounter for gynecological examination (general) (routine) without abnormal findings: Secondary | ICD-10-CM

## 2014-03-25 DIAGNOSIS — Z309 Encounter for contraceptive management, unspecified: Secondary | ICD-10-CM

## 2014-03-25 DIAGNOSIS — Z124 Encounter for screening for malignant neoplasm of cervix: Secondary | ICD-10-CM

## 2014-03-25 MED ORDER — NORGESTIM-ETH ESTRAD TRIPHASIC 0.18/0.215/0.25 MG-25 MCG PO TABS: 1.0000 | ORAL_TABLET | Freq: Every day | ORAL | Status: DC

## 2014-03-25 NOTE — Progress Notes (Signed)
48 y.o. G0P0000 Married Caucasian Fe here for annual exam. Periods normal, no issues. Contraception working well. Sees PCP for aex and labs, prn. Treating plantar fascitis in right foot, resolving. Spouse retired now and enjoying it. Patient has bought another [place] to train as therapy dog. Has helped her with depression with loss of last one. No health issues today.  Patient's last menstrual period was 02/28/2014.          Sexually active: Yes.    The current method of family planning is OCP (estrogen/progesterone).    Exercising: Yes.    walking Smoker:  no  Health Maintenance: Pap: 03-01-12 neg HPV HR neg MMG: 10-14-13 composition category c,birads category 1:neg Colonoscopy:  none BMD:   none TDaP:  2013 Labs:none  Self breast exam:not done   reports that she has never smoked. She has never used smokeless tobacco. She reports that she does not drink alcohol or use illicit drugs.  Past Medical History  Diagnosis Date  . Hyperlipidemia   . Preventative health care 08/22/2011  . Tinea pedis 12/02/2011  . Cerumen impaction 06/22/2012  . Dysmenorrhea   . Menorrhagia   . Fibroid   . Pain of right heel 07/05/2013  . Plantar fasciitis of right foot 07/05/2013    Past Surgical History  Procedure Laterality Date  . 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 Fibroidectomy    . Hysteroscopic resection  5/04    Current Outpatient Prescriptions  Medication Sig Dispense Refill  . Calcium Carbonate-Vitamin D (CALTRATE 600+D) 600-400 MG-UNIT per tablet Take 1 tablet by mouth daily.      . MELOXICAM PO Take 1 tablet by mouth daily as needed.      . Multiple Vitamin (MULTIVITAMIN) tablet Take 1 tablet by mouth daily.      . NON FORMULARY daily. Omega red       . Norgestimate-Ethinyl Estradiol Triphasic (ORTHO TRI-CYCLEN LO) 0.18/0.215/0.25 MG-25 MCG tab Take 1 tablet by mouth daily.  3 Package  4   No current facility-administered medications  for this visit.    Family History  Problem Relation Age of Onset  . Diabetes Mother     type 2  . Heart attack Mother 35  . Hypertension Mother   . Hyperlipidemia Mother   . Stroke Mother     ministrokes  . Dementia Mother   . Heart disease Mother     MI 2005, s/p 3 stents  . Congestive Heart Failure Mother   . Heart attack Maternal Grandmother 67  . Diabetes Maternal Grandmother     type 2  . Hypertension Maternal Grandmother   . Hyperlipidemia Maternal Grandmother   . Stroke Maternal Grandfather   . Heart attack Paternal Grandfather   . Heart disease Paternal Grandfather     MI?  Marland Kitchen Hyperlipidemia Sister   . Diabetes Brother     type 2  . Alcohol abuse Brother   . Mental illness Brother     ROS:  Pertinent items are noted in HPI.  Otherwise, a comprehensive ROS was negative.  Exam:   BP 110/68  Pulse 68  Resp 16  Ht 5' 5.75" (1.67 m)  Wt 140 lb (63.504 kg)  BMI 22.77 kg/m2  LMP 02/28/2014 Height: 5' 5.75" (167 cm)  Ht Readings from Last 3 Encounters:  03/25/14 5' 5.75" (1.67 m)  03/06/14 5' 5.5" (1.664 m)  02/24/14 5' 5.5" (1.664 m)  General appearance: alert, cooperative and appears stated age Head: Normocephalic, without obvious abnormality, atraumatic Neck: no adenopathy, supple, symmetrical, trachea midline and thyroid normal to inspection and palpation Lungs: clear to auscultation bilaterally Breasts: normal appearance, no masses or tenderness, No nipple retraction or dimpling, No nipple discharge or bleeding, No axillary or supraclavicular adenopathy Heart: regular rate and rhythm Abdomen: soft, non-tender; no masses,  no organomegaly Extremities: extremities normal, atraumatic, no cyanosis or edema Skin: Skin color, texture, turgor normal. No rashes or lesions Lymph nodes: Cervical, supraclavicular, and axillary nodes normal. No abnormal inguinal nodes palpated Neurologic: Grossly normal   Pelvic: External genitalia:  no lesions               Urethra:  normal appearing urethra with no masses, tenderness or lesions              Bartholin's and Skene's: normal                 Vagina: normal appearing vagina with normal color and discharge, no lesions              Cervix: normal, no lesions, non tender              Pap taken: Yes.   Bimanual Exam:  Uterus:  normal size, contour, position, consistency, mobility, non-tender and mid positiion              Adnexa: normal adnexa and no mass, fullness, tenderness               Rectovaginal: Confirms               Anus:  normal sphincter tone, no lesions  A:  Well Woman with normal exam  Contraception OCP desired    P:   Reviewed health and wellness pertinent to exam  Pap smear taken today with HPV reflex   counseled on breast self exam, mammography screening, use and side effects of OCP's, adequate intake of calcium and vitamin D, diet and exercise  return annually or prn  An After Visit Summary was printed and given to the patient.

## 2014-03-25 NOTE — Patient Instructions (Signed)

## 2014-03-26 LAB — IPS PAP TEST WITH REFLEX TO HPV

## 2014-03-31 NOTE — Progress Notes (Signed)
Reviewed personally.  M. Suzanne Darelle Kings, MD.  

## 2014-08-18 ENCOUNTER — Encounter: Payer: Self-pay | Admitting: Family Medicine

## 2014-08-18 ENCOUNTER — Ambulatory Visit (INDEPENDENT_AMBULATORY_CARE_PROVIDER_SITE_OTHER): Payer: 59 | Admitting: Family Medicine

## 2014-08-18 VITALS — BP 135/70 | HR 80 | Temp 98.8°F | Ht 66.0 in | Wt 150.0 lb

## 2014-08-18 DIAGNOSIS — H8112 Benign paroxysmal vertigo, left ear: Secondary | ICD-10-CM

## 2014-08-18 MED ORDER — MECLIZINE HCL 25 MG PO TABS: 25.0000 mg | ORAL_TABLET | Freq: Three times a day (TID) | ORAL | Status: DC | PRN

## 2014-08-18 NOTE — Patient Instructions (Signed)
Hydrate with 64 oz of clear fluids daily Mucinex twice daily x 10 days Nasal saline flushes to nose twice daily Probiotics such as Digestive Advantage or [facility] If no relief can add Flonase for a month Call if worsens   Benign Positional Vertigo Vertigo means you feel like you or your surroundings are moving when they are not. Benign positional vertigo is the most common form of vertigo. Benign means that the cause of your condition is not serious. Benign positional vertigo is more common in older adults. CAUSES  Benign positional vertigo is the result of an upset in the labyrinth system. This is an area in the middle ear that helps control your balance. This may be caused by a viral infection, head injury, or repetitive motion. However, often no specific cause is found. SYMPTOMS  Symptoms of benign positional vertigo occur when you move your head or eyes in different directions. Some of the symptoms may include:  Loss of balance and falls.  Vomiting.  Blurred vision.  Dizziness.  Nausea.  Involuntary eye movements (nystagmus). DIAGNOSIS  Benign positional vertigo is usually diagnosed by physical exam. If the specific cause of your benign positional vertigo is unknown, your caregiver may perform imaging tests, such as magnetic resonance imaging (MRI) or computed tomography (CT). TREATMENT  Your caregiver may recommend movements or procedures to correct the benign positional vertigo. Medicines such as meclizine, benzodiazepines, and medicines for nausea may be used to treat your symptoms. In rare cases, if your symptoms are caused by certain conditions that affect the inner ear, you may need surgery. HOME CARE INSTRUCTIONS   Follow your caregiver's instructions.  Move slowly. Do not make sudden body or head movements.  Avoid driving.  Avoid operating heavy machinery.  Avoid performing any tasks that would be dangerous to you or others during a vertigo  episode.  Drink enough fluids to keep your urine clear or pale yellow. SEEK IMMEDIATE MEDICAL CARE IF:   You develop problems with walking, weakness, numbness, or using your arms, hands, or legs.  You have difficulty speaking.  You develop severe headaches.  Your nausea or vomiting continues or gets worse.  You develop visual changes.  Your family or friends notice any behavioral changes.  Your condition gets worse.  You have a fever.  You develop a stiff neck or sensitivity to light. MAKE SURE YOU:   Understand these instructions.  Will watch your condition.  Will get help right away if you are not doing well or get worse. Document Released: 04/04/2006 Document Revised: 09/19/2011 Document Reviewed: 03/17/2011 [facility] Patient Information 2015 [place], [place]. This information is not intended to replace advice given to you by your health care provider. Make sure you discuss any questions you have with your health care provider.

## 2014-08-18 NOTE — Progress Notes (Signed)
Pre visit review using our clinic review tool, if applicable. No additional management support is needed unless otherwise documented below in the visit note. 

## 2014-08-18 NOTE — Progress Notes (Signed)
Jasmin Taylor  [id number] 01-Aug-1965 08/18/2014      Progress Note-Follow Up  Subjective  Chief Complaint  Chief Complaint  Patient presents with  . Dizziness  . Ear Fullness    HPI  Patient is a 49 y.o. female in today for routine medical care. Patient is in today for evaluation of vertigo. She has had several episodes of feeling woozy and lightheaded. She had URI symptoms last month with congestion cough malaise though symptoms have improved but this week she had an episode of turning her head quickly and feeling the room spin she will tenderness sweat and had some nausea. Notes similar symptoms when she rolls over in bed. Has a persistent feeling of ears being clogged but no decrease in hearing or tinnitus. No other acute concerns noted. Denies CP/palp/SOB/HA/congestion/fevers/GI or GU c/o. Taking meds as prescribed  Past Medical History  Diagnosis Date  . Hyperlipidemia   . Preventative health care 08/22/2011  . Tinea pedis 12/02/2011  . Cerumen impaction 06/22/2012  . Dysmenorrhea   . Menorrhagia   . Fibroid   . Pain of right heel 07/05/2013  . Plantar fasciitis of right foot 07/05/2013    Past Surgical History  Procedure Laterality Date  . 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 Fibroidectomy    . Hysteroscopic resection  5/04    Family History  Problem Relation Age of Onset  . Diabetes Mother     type 2  . Heart attack Mother 19  . Hypertension Mother   . Hyperlipidemia Mother   . Stroke Mother     ministrokes  . Dementia Mother   . Heart disease Mother     MI 2005, s/p 3 stents  . Congestive Heart Failure Mother   . Heart attack Maternal Grandmother 67  . Diabetes Maternal Grandmother     type 2  . Hypertension Maternal Grandmother   . Hyperlipidemia Maternal Grandmother   . Stroke Maternal Grandfather   . Heart attack Paternal Grandfather   . Heart disease Paternal Grandfather     MI?  Marland Kitchen Hyperlipidemia Sister     . Diabetes Brother     type 2  . Alcohol abuse Brother   . Mental illness Brother     History   Social History  . Marital Status: Married    Spouse Name: N/A    Number of Children: N/A  . Years of Education: N/A   Occupational History  . Not on file.   Social History Main Topics  . Smoking status: Never Smoker   . Smokeless tobacco: Never Used  . Alcohol Use: No  . Drug Use: No  . Sexual Activity:    Partners: Male    Birth Control/ Protection: Pill   Other Topics Concern  . Not on file   Social History Narrative    Current Outpatient Prescriptions on File Prior to Visit  Medication Sig Dispense Refill  . Calcium Carbonate-Vitamin D (CALTRATE 600+D) 600-400 MG-UNIT per tablet Take 1 tablet by mouth daily.    . MELOXICAM PO Take 1 tablet by mouth daily as needed.    . Multiple Vitamin (MULTIVITAMIN) tablet Take 1 tablet by mouth daily.    . NON FORMULARY daily. Omega red     . Norgestimate-Ethinyl Estradiol Triphasic (ORTHO TRI-CYCLEN LO) 0.18/0.215/0.25 MG-25 MCG tab Take 1 tablet by mouth daily. 3 Package 4   No current facility-administered medications on file prior to visit.  No Known Allergies  Review of Systems  Review of Systems  Constitutional: Negative for fever and malaise/fatigue.  HENT: Positive for congestion.   Eyes: Negative for discharge.  Respiratory: Negative for shortness of breath.   Cardiovascular: Negative for chest pain, palpitations and leg swelling.  Gastrointestinal: Negative for nausea, abdominal pain and diarrhea.  Genitourinary: Negative for dysuria.  Musculoskeletal: Negative for falls.  Skin: Negative for rash.  Neurological: Positive for dizziness. Negative for loss of consciousness and headaches.  Endo/Heme/Allergies: Negative for polydipsia.  Psychiatric/Behavioral: Negative for depression and suicidal ideas. The patient is not nervous/anxious and does not have insomnia.     Objective  BP 135/70 mmHg  Pulse 80   Temp(Src) 98.8 F (37.1 C) (Oral)  Ht 5\' 6"  (1.676 m)  Wt 150 lb (68.04 kg)  BMI 24.22 kg/m2  SpO2 95%  Physical Exam  Physical Exam  Constitutional: She is oriented to person, place, and time and well-developed, well-nourished, and in no distress. No distress.  HENT:  Head: Normocephalic and atraumatic.  Eyes: Conjunctivae are normal.  Neck: Neck supple. No thyromegaly present.  Cardiovascular: Normal rate, regular rhythm and normal heart sounds.   No murmur heard. Pulmonary/Chest: Effort normal and breath sounds normal. She has no wheezes.  Abdominal: She exhibits no distension and no mass.  Musculoskeletal: She exhibits no edema.  Lymphadenopathy:    She has no cervical adenopathy.  Neurological: She is alert and oriented to person, place, and time.  2 beats of nystagmus with lateral gaze to left  Skin: Skin is warm and dry. No rash noted. She is not diaphoretic.  Psychiatric: Memory, affect and judgment normal.    Lab Results  Component Value Date   TSH 1.194 08/22/2013   Lab Results  Component Value Date   WBC 8.5 08/22/2013   HGB 13.2 08/22/2013   HCT 38.5 08/22/2013   MCV 89.3 08/22/2013   PLT 283 08/22/2013   Lab Results  Component Value Date   CREATININE 0.69 08/22/2013   BUN 19 08/22/2013   NA 140 08/22/2013   K 4.5 08/22/2013   CL 105 08/22/2013   CO2 28 08/22/2013   Lab Results  Component Value Date   ALT 17 08/22/2013   AST 15 08/22/2013   ALKPHOS 72 08/22/2013   BILITOT 0.4 08/22/2013   Lab Results  Component Value Date   CHOL 168 08/22/2013   Lab Results  Component Value Date   HDL 54 08/22/2013   Lab Results  Component Value Date   LDLCALC 82 08/22/2013   Lab Results  Component Value Date   TRIG 161* 08/22/2013   Lab Results  Component Value Date   CHOLHDL 3.1 08/22/2013     Assessment & Plan  Benign paroxysmal positional vertigo Recent mild, uri symptoms. Encouraged increased rest and hydration. May use Meclizine  prn

## 2014-08-25 ENCOUNTER — Encounter: Payer: Self-pay | Admitting: Family Medicine

## 2014-08-25 DIAGNOSIS — H8110 Benign paroxysmal vertigo, unspecified ear: Secondary | ICD-10-CM

## 2014-08-25 HISTORY — DX: Benign paroxysmal vertigo, unspecified ear: H81.10

## 2014-08-25 NOTE — Assessment & Plan Note (Signed)
Recent mild, uri symptoms. Encouraged increased rest and hydration. May use Meclizine prn

## 2014-09-01 ENCOUNTER — Encounter: Payer: 59 | Admitting: Family Medicine

## 2014-10-22 ENCOUNTER — Telehealth: Payer: Self-pay | Admitting: Certified Nurse Midwife

## 2014-10-22 NOTE — Telephone Encounter (Signed)
Left message to reschedule aex appointment. °

## 2014-11-03 LAB — HM MAMMOGRAPHY

## 2014-11-10 ENCOUNTER — Encounter: Payer: Self-pay | Admitting: Family Medicine

## 2014-11-18 ENCOUNTER — Telehealth: Payer: Self-pay | Admitting: Family Medicine

## 2014-11-18 NOTE — Telephone Encounter (Signed)
Pre Visit letter sent  °

## 2014-12-09 ENCOUNTER — Encounter: Payer: Self-pay | Admitting: Family Medicine

## 2014-12-09 ENCOUNTER — Ambulatory Visit (INDEPENDENT_AMBULATORY_CARE_PROVIDER_SITE_OTHER): Payer: 59 | Admitting: Family Medicine

## 2014-12-09 VITALS — BP 120/84 | HR 76 | Temp 98.0°F | Ht 66.0 in | Wt 148.2 lb

## 2014-12-09 DIAGNOSIS — E782 Mixed hyperlipidemia: Secondary | ICD-10-CM

## 2014-12-09 DIAGNOSIS — E785 Hyperlipidemia, unspecified: Secondary | ICD-10-CM

## 2014-12-09 DIAGNOSIS — Z0000 Encounter for general adult medical examination without abnormal findings: Secondary | ICD-10-CM | POA: Diagnosis not present

## 2014-12-09 DIAGNOSIS — M722 Plantar fascial fibromatosis: Secondary | ICD-10-CM

## 2014-12-09 NOTE — Progress Notes (Signed)
Pre visit review using our clinic review tool, if applicable. No additional management support is needed unless otherwise documented below in the visit note. 

## 2014-12-09 NOTE — Patient Instructions (Addendum)
Eating Well or [employer] DASH diet   Preventive Care for Adults A healthy lifestyle and preventive care can promote health and wellness. Preventive health guidelines for women include the following key practices.  A routine yearly physical is a good way to check with your health care provider about your health and preventive screening. It is a chance to share any concerns and updates on your health and to receive a thorough exam.  Visit your dentist for a routine exam and preventive care every 6 months. Brush your teeth twice a day and floss once a day. Good oral hygiene prevents tooth decay and gum disease.  The frequency of eye exams is based on your age, health, family medical history, use of contact lenses, and other factors. Follow your health care provider's recommendations for frequency of eye exams.  Eat a healthy diet. Foods like vegetables, fruits, whole grains, low-fat dairy products, and lean protein foods contain the nutrients you need without too many calories. Decrease your intake of foods high in solid fats, added sugars, and salt. Eat the right amount of calories for you.Get information about a proper diet from your health care provider, if necessary.  Regular physical exercise is one of the most important things you can do for your health. Most adults should get at least 150 minutes of moderate-intensity exercise (any activity that increases your heart rate and causes you to sweat) each week. In addition, most adults need muscle-strengthening exercises on 2 or more days a week.  Maintain a healthy weight. The body mass index (BMI) is a screening tool to identify possible weight problems. It provides an estimate of body fat based on height and weight. Your health care provider can find your BMI and can help you achieve or maintain a healthy weight.For adults 20 years and older:  A BMI below 18.5 is considered underweight.  A BMI of 18.5 to 24.9 is normal.  A  BMI of 25 to 29.9 is considered overweight.  A BMI of 30 and above is considered obese.  Maintain normal blood lipids and cholesterol levels by exercising and minimizing your intake of saturated fat. Eat a balanced diet with plenty of fruit and vegetables. Blood tests for lipids and cholesterol should begin at age 68 and be repeated every 5 years. If your lipid or cholesterol levels are high, you are over 50, or you are at high risk for heart disease, you may need your cholesterol levels checked more frequently.Ongoing high lipid and cholesterol levels should be treated with medicines if diet and exercise are not working.  If you smoke, find out from your health care provider how to quit. If you do not use tobacco, do not start.  Lung cancer screening is recommended for adults aged 57-80 years who are at high risk for developing lung cancer because of a history of smoking. A yearly low-dose CT scan of the lungs is recommended for people who have at least a 30-pack-year history of smoking and are a current smoker or have quit within the past 15 years. A pack year of smoking is smoking an average of 1 pack of cigarettes a day for 1 year (for example: 1 pack a day for 30 years or 2 packs a day for 15 years). Yearly screening should continue until the smoker has stopped smoking for at least 15 years. Yearly screening should be stopped for people who develop a health problem that would prevent them from having lung cancer treatment.  If you  are pregnant, do not drink alcohol. If you are breastfeeding, be very cautious about drinking alcohol. If you are not pregnant and choose to drink alcohol, do not have more than 1 drink per day. One drink is considered to be 12 ounces (355 mL) of beer, 5 ounces (148 mL) of wine, or 1.5 ounces (44 mL) of liquor.  Avoid use of street drugs. Do not share needles with anyone. Ask for help if you need support or instructions about stopping the use of drugs.  High blood  pressure causes heart disease and increases the risk of stroke. Your blood pressure should be checked at least every 1 to 2 years. Ongoing high blood pressure should be treated with medicines if weight loss and exercise do not work.  If you are 66-44 years old, ask your health care provider if you should take aspirin to prevent strokes.  Diabetes screening involves taking a blood sample to check your fasting blood sugar level. This should be done once every 3 years, after age 35, if you are within normal weight and without risk factors for diabetes. Testing should be considered at a younger age or be carried out more frequently if you are overweight and have at least 1 risk factor for diabetes.  Breast cancer screening is essential preventive care for women. You should practice "breast self-awareness." This means understanding the normal appearance and feel of your breasts and may include breast self-examination. Any changes detected, no matter how small, should be reported to a health care provider. Women in their 34s and 30s should have a clinical breast exam (CBE) by a health care provider as part of a regular health exam every 1 to 3 years. After age 63, women should have a CBE every year. Starting at age 38, women should consider having a mammogram (breast X-ray test) every year. Women who have a family history of breast cancer should talk to their health care provider about genetic screening. Women at a high risk of breast cancer should talk to their health care providers about having an MRI and a mammogram every year.  Breast cancer gene (BRCA)-related cancer risk assessment is recommended for women who have family members with BRCA-related cancers. BRCA-related cancers include breast, ovarian, tubal, and peritoneal cancers. Having family members with these cancers may be associated with an increased risk for harmful changes (mutations) in the breast cancer genes BRCA1 and BRCA2. Results of the  assessment will determine the need for genetic counseling and BRCA1 and BRCA2 testing.  Routine pelvic exams to screen for cancer are no longer recommended for nonpregnant women who are considered low risk for cancer of the pelvic organs (ovaries, uterus, and vagina) and who do not have symptoms. Ask your health care provider if a screening pelvic exam is right for you.  If you have had past treatment for cervical cancer or a condition that could lead to cancer, you need Pap tests and screening for cancer for at least 20 years after your treatment. If Pap tests have been discontinued, your risk factors (such as having a new sexual partner) need to be reassessed to determine if screening should be resumed. Some women have medical problems that increase the chance of getting cervical cancer. In these cases, your health care provider may recommend more frequent screening and Pap tests.  The HPV test is an additional test that may be used for cervical cancer screening. The HPV test looks for the virus that can cause the cell changes on the  cervix. The cells collected during the Pap test can be tested for HPV. The HPV test could be used to screen women aged 45 years and older, and should be used in women of any age who have unclear Pap test results. After the age of 13, women should have HPV testing at the same frequency as a Pap test.  Colorectal cancer can be detected and often prevented. Most routine colorectal cancer screening begins at the age of 59 years and continues through age 90 years. However, your health care provider may recommend screening at an earlier age if you have risk factors for colon cancer. On a yearly basis, your health care provider may provide home test kits to check for hidden blood in the stool. Use of a small camera at the end of a tube, to directly examine the colon (sigmoidoscopy or colonoscopy), can detect the earliest forms of colorectal cancer. Talk to your health care provider  about this at age 45, when routine screening begins. Direct exam of the colon should be repeated every 5-10 years through age 61 years, unless early forms of pre-cancerous polyps or small growths are found.  People who are at an increased risk for hepatitis B should be screened for this virus. You are considered at high risk for hepatitis B if:  You were born in a country where hepatitis B occurs often. Talk with your health care provider about which countries are considered high risk.  Your parents were born in a high-risk country and you have not received a shot to protect against hepatitis B (hepatitis B vaccine).  You have HIV or AIDS.  You use needles to inject street drugs.  You live with, or have sex with, someone who has hepatitis B.  You get hemodialysis treatment.  You take certain medicines for conditions like cancer, organ transplantation, and autoimmune conditions.  Hepatitis C blood testing is recommended for all people born from [birth year] through 1965 and any individual with known risks for hepatitis C.  Practice safe sex. Use condoms and avoid high-risk sexual practices to reduce the spread of sexually transmitted infections (STIs). STIs include gonorrhea, chlamydia, syphilis, trichomonas, herpes, HPV, and human immunodeficiency virus (HIV). Herpes, HIV, and HPV are viral illnesses that have no cure. They can result in disability, cancer, and death.  You should be screened for sexually transmitted illnesses (STIs) including gonorrhea and chlamydia if:  You are sexually active and are younger than 24 years.  You are older than 24 years and your health care provider tells you that you are at risk for this type of infection.  Your sexual activity has changed since you were last screened and you are at an increased risk for chlamydia or gonorrhea. Ask your health care provider if you are at risk.  If you are at risk of being infected with HIV, it is recommended that you take a  prescription medicine daily to prevent HIV infection. This is called preexposure prophylaxis (PrEP). You are considered at risk if:  You are a heterosexual woman, are sexually active, and are at increased risk for HIV infection.  You take drugs by injection.  You are sexually active with a partner who has HIV.  Talk with your health care provider about whether you are at high risk of being infected with HIV. If you choose to begin PrEP, you should first be tested for HIV. You should then be tested every 3 months for as long as you are taking PrEP.  Osteoporosis is  a disease in which the bones lose minerals and strength with aging. This can result in serious bone fractures or breaks. The risk of osteoporosis can be identified using a bone density scan. Women ages 31 years and over and women at risk for fractures or osteoporosis should discuss screening with their health care providers. Ask your health care provider whether you should take a calcium supplement or vitamin D to reduce the rate of osteoporosis.  Menopause can be associated with physical symptoms and risks. Hormone replacement therapy is available to decrease symptoms and risks. You should talk to your health care provider about whether hormone replacement therapy is right for you.  Use sunscreen. Apply sunscreen liberally and repeatedly throughout the day. You should seek shade when your shadow is shorter than you. Protect yourself by wearing long sleeves, pants, a wide-brimmed hat, and sunglasses year round, whenever you are outdoors.  Once a month, do a whole body skin exam, using a mirror to look at the skin on your back. Tell your health care provider of new moles, moles that have irregular borders, moles that are larger than a pencil eraser, or moles that have changed in shape or color.  Stay current with required vaccines (immunizations).  Influenza vaccine. All adults should be immunized every year.  Tetanus, diphtheria, and  acellular pertussis (Td, Tdap) vaccine. Pregnant women should receive 1 dose of Tdap vaccine during each pregnancy. The dose should be obtained regardless of the length of time since the last dose. Immunization is preferred during the 27th-36th week of gestation. An adult who has not previously received Tdap or who does not know her vaccine status should receive 1 dose of Tdap. This initial dose should be followed by tetanus and diphtheria toxoids (Td) booster doses every 10 years. Adults with an unknown or incomplete history of completing a 3-dose immunization series with Td-containing vaccines should begin or complete a primary immunization series including a Tdap dose. Adults should receive a Td booster every 10 years.  Varicella vaccine. An adult without evidence of immunity to varicella should receive 2 doses or a second dose if she has previously received 1 dose. Pregnant females who do not have evidence of immunity should receive the first dose after pregnancy. This first dose should be obtained before leaving the health care facility. The second dose should be obtained 4-8 weeks after the first dose.  Human papillomavirus (HPV) vaccine. Females aged 13-26 years who have not received the vaccine previously should obtain the 3-dose series. The vaccine is not recommended for use in pregnant females. However, pregnancy testing is not needed before receiving a dose. If a female is found to be pregnant after receiving a dose, no treatment is needed. In that case, the remaining doses should be delayed until after the pregnancy. Immunization is recommended for any person with an immunocompromised condition through the age of 54 years if she did not get any or all doses earlier. During the 3-dose series, the second dose should be obtained 4-8 weeks after the first dose. The third dose should be obtained 24 weeks after the first dose and 16 weeks after the second dose.  Zoster vaccine. One dose is recommended  for adults aged 46 years or older unless certain conditions are present.  Measles, mumps, and rubella (MMR) vaccine. Adults born before 45 generally are considered immune to measles and mumps. Adults born in [birth year] or later should have 1 or more doses of MMR vaccine unless there is a contraindication to  the vaccine or there is laboratory evidence of immunity to each of the three diseases. A routine second dose of MMR vaccine should be obtained at least 28 days after the first dose for students attending postsecondary schools, health care workers, or international travelers. People who received inactivated measles vaccine or an unknown type of measles vaccine during 1963-1967 should receive 2 doses of MMR vaccine. People who received inactivated mumps vaccine or an unknown type of mumps vaccine before 1979 and are at high risk for mumps infection should consider immunization with 2 doses of MMR vaccine. For females of childbearing age, rubella immunity should be determined. If there is no evidence of immunity, females who are not pregnant should be vaccinated. If there is no evidence of immunity, females who are pregnant should delay immunization until after pregnancy. Unvaccinated health care workers born before [birth year] who lack laboratory evidence of measles, mumps, or rubella immunity or laboratory confirmation of disease should consider measles and mumps immunization with 2 doses of MMR vaccine or rubella immunization with 1 dose of MMR vaccine.  Pneumococcal 13-valent conjugate (PCV13) vaccine. When indicated, a person who is uncertain of her immunization history and has no record of immunization should receive the PCV13 vaccine. An adult aged 64 years or older who has certain medical conditions and has not been previously immunized should receive 1 dose of PCV13 vaccine. This PCV13 should be followed with a dose of pneumococcal polysaccharide (PPSV23) vaccine. The PPSV23 vaccine dose should be obtained at  least 8 weeks after the dose of PCV13 vaccine. An adult aged 61 years or older who has certain medical conditions and previously received 1 or more doses of PPSV23 vaccine should receive 1 dose of PCV13. The PCV13 vaccine dose should be obtained 1 or more years after the last PPSV23 vaccine dose.  Pneumococcal polysaccharide (PPSV23) vaccine. When PCV13 is also indicated, PCV13 should be obtained first. All adults aged 78 years and older should be immunized. An adult younger than age 83 years who has certain medical conditions should be immunized. Any person who resides in a nursing home or long-term care facility should be immunized. An adult smoker should be immunized. People with an immunocompromised condition and certain other conditions should receive both PCV13 and PPSV23 vaccines. People with human immunodeficiency virus (HIV) infection should be immunized as soon as possible after diagnosis. Immunization during chemotherapy or radiation therapy should be avoided. Routine use of PPSV23 vaccine is not recommended for American Indians, [place] Natives, or people younger than 65 years unless there are medical conditions that require PPSV23 vaccine. When indicated, people who have unknown immunization and have no record of immunization should receive PPSV23 vaccine. One-time revaccination 5 years after the first dose of PPSV23 is recommended for people aged 19-64 years who have chronic kidney failure, nephrotic syndrome, asplenia, or immunocompromised conditions. People who received 1-2 doses of PPSV23 before age 27 years should receive another dose of PPSV23 vaccine at age 33 years or later if at least 5 years have passed since the previous dose. Doses of PPSV23 are not needed for people immunized with PPSV23 at or after age 30 years.  Meningococcal vaccine. Adults with asplenia or persistent complement component deficiencies should receive 2 doses of quadrivalent meningococcal conjugate (MenACWY-D) vaccine.  The doses should be obtained at least 2 months apart. Microbiologists working with certain meningococcal bacteria, [employer] recruits, people at risk during an outbreak, and people who travel to or live in countries with a high rate of meningitis  should be immunized. A first-year college student up through age 29 years who is living in a residence hall should receive a dose if she did not receive a dose on or after her 16th birthday. Adults who have certain high-risk conditions should receive one or more doses of vaccine.  Hepatitis A vaccine. Adults who wish to be protected from this disease, have certain high-risk conditions, work with hepatitis A-infected animals, work in hepatitis A research labs, or travel to or work in countries with a high rate of hepatitis A should be immunized. Adults who were previously unvaccinated and who anticipate close contact with an international adoptee during the first 60 days after arrival in the [place] from a country with a high rate of hepatitis A should be immunized.  Hepatitis B vaccine. Adults who wish to be protected from this disease, have certain high-risk conditions, may be exposed to blood or other infectious body fluids, are household contacts or sex partners of hepatitis B positive people, are clients or workers in certain care facilities, or travel to or work in countries with a high rate of hepatitis B should be immunized.  Haemophilus influenzae type b (Hib) vaccine. A previously unvaccinated person with asplenia or sickle cell disease or having a scheduled splenectomy should receive 1 dose of Hib vaccine. Regardless of previous immunization, a recipient of a hematopoietic stem cell transplant should receive a 3-dose series 6-12 months after her successful transplant. Hib vaccine is not recommended for adults with HIV infection. Preventive Services / Frequency Ages 17 to 69 years  Blood pressure check.** / Every 1 to 2 years.  Lipid and  cholesterol check.** / Every 5 years beginning at age 59.  Clinical breast exam.** / Every 3 years for women in their 15s and 49s.  BRCA-related cancer risk assessment.** / For women who have family members with a BRCA-related cancer (breast, ovarian, tubal, or peritoneal cancers).  Pap test.** / Every 2 years from ages 68 through 43. Every 3 years starting at age 22 through age 61 or 28 with a history of 3 consecutive normal Pap tests.  HPV screening.** / Every 3 years from ages 58 through ages 3 to 29 with a history of 3 consecutive normal Pap tests.  Hepatitis C blood test.** / For any individual with known risks for hepatitis C.  Skin self-exam. / Monthly.  Influenza vaccine. / Every year.  Tetanus, diphtheria, and acellular pertussis (Tdap, Td) vaccine.** / Consult your health care provider. Pregnant women should receive 1 dose of Tdap vaccine during each pregnancy. 1 dose of Td every 10 years.  Varicella vaccine.** / Consult your health care provider. Pregnant females who do not have evidence of immunity should receive the first dose after pregnancy.  HPV vaccine. / 3 doses over 6 months, if 3 and younger. The vaccine is not recommended for use in pregnant females. However, pregnancy testing is not needed before receiving a dose.  Measles, mumps, rubella (MMR) vaccine.** / You need at least 1 dose of MMR if you were born in 1957 or later. You may also need a 2nd dose. For females of childbearing age, rubella immunity should be determined. If there is no evidence of immunity, females who are not pregnant should be vaccinated. If there is no evidence of immunity, females who are pregnant should delay immunization until after pregnancy.  Pneumococcal 13-valent conjugate (PCV13) vaccine.** / Consult your health care provider.  Pneumococcal polysaccharide (PPSV23) vaccine.** / 1 to 2 doses if you smoke  cigarettes or if you have certain conditions.  Meningococcal vaccine.** / 1 dose if  you are age 78 to 37 years and a Market researcher living in a residence hall, or have one of several medical conditions, you need to get vaccinated against meningococcal disease. You may also need additional booster doses.  Hepatitis A vaccine.** / Consult your health care provider.  Hepatitis B vaccine.** / Consult your health care provider.  Haemophilus influenzae type b (Hib) vaccine.** / Consult your health care provider. Ages 22 to 43 years  Blood pressure check.** / Every 1 to 2 years.  Lipid and cholesterol check.** / Every 5 years beginning at age 17 years.  Lung cancer screening. / Every year if you are aged 14-80 years and have a 30-pack-year history of smoking and currently smoke or have quit within the past 15 years. Yearly screening is stopped once you have quit smoking for at least 15 years or develop a health problem that would prevent you from having lung cancer treatment.  Clinical breast exam.** / Every year after age 59 years.  BRCA-related cancer risk assessment.** / For women who have family members with a BRCA-related cancer (breast, ovarian, tubal, or peritoneal cancers).  Mammogram.** / Every year beginning at age 40 years and continuing for as long as you are in good health. Consult with your health care provider.  Pap test.** / Every 3 years starting at age 49 years through age 64 or 45 years with a history of 3 consecutive normal Pap tests.  HPV screening.** / Every 3 years from ages 77 years through ages 5 to 65 years with a history of 3 consecutive normal Pap tests.  Fecal occult blood test (FOBT) of stool. / Every year beginning at age 52 years and continuing until age 65 years. You may not need to do this test if you get a colonoscopy every 10 years.  Flexible sigmoidoscopy or colonoscopy.** / Every 5 years for a flexible sigmoidoscopy or every 10 years for a colonoscopy beginning at age 20 years and continuing until age 77 years.  Hepatitis C  blood test.** / For all people born from [birth year] through 1965 and any individual with known risks for hepatitis C.  Skin self-exam. / Monthly.  Influenza vaccine. / Every year.  Tetanus, diphtheria, and acellular pertussis (Tdap/Td) vaccine.** / Consult your health care provider. Pregnant women should receive 1 dose of Tdap vaccine during each pregnancy. 1 dose of Td every 10 years.  Varicella vaccine.** / Consult your health care provider. Pregnant females who do not have evidence of immunity should receive the first dose after pregnancy.  Zoster vaccine.** / 1 dose for adults aged 77 years or older.  Measles, mumps, rubella (MMR) vaccine.** / You need at least 1 dose of MMR if you were born in 1957 or later. You may also need a 2nd dose. For females of childbearing age, rubella immunity should be determined. If there is no evidence of immunity, females who are not pregnant should be vaccinated. If there is no evidence of immunity, females who are pregnant should delay immunization until after pregnancy.  Pneumococcal 13-valent conjugate (PCV13) vaccine.** / Consult your health care provider.  Pneumococcal polysaccharide (PPSV23) vaccine.** / 1 to 2 doses if you smoke cigarettes or if you have certain conditions.  Meningococcal vaccine.** / Consult your health care provider.  Hepatitis A vaccine.** / Consult your health care provider.  Hepatitis B vaccine.** / Consult your health care provider.  Haemophilus influenzae type  b (Hib) vaccine.** / Consult your health care provider. Ages 28 years and over  Blood pressure check.** / Every 1 to 2 years.  Lipid and cholesterol check.** / Every 5 years beginning at age 16 years.  Lung cancer screening. / Every year if you are aged 13-80 years and have a 30-pack-year history of smoking and currently smoke or have quit within the past 15 years. Yearly screening is stopped once you have quit smoking for at least 15 years or develop a health problem  that would prevent you from having lung cancer treatment.  Clinical breast exam.** / Every year after age 54 years.  BRCA-related cancer risk assessment.** / For women who have family members with a BRCA-related cancer (breast, ovarian, tubal, or peritoneal cancers).  Mammogram.** / Every year beginning at age 65 years and continuing for as long as you are in good health. Consult with your health care provider.  Pap test.** / Every 3 years starting at age 57 years through age 79 or 3 years with 3 consecutive normal Pap tests. Testing can be stopped between 65 and 70 years with 3 consecutive normal Pap tests and no abnormal Pap or HPV tests in the past 10 years.  HPV screening.** / Every 3 years from ages 63 years through ages 48 or 55 years with a history of 3 consecutive normal Pap tests. Testing can be stopped between 65 and 70 years with 3 consecutive normal Pap tests and no abnormal Pap or HPV tests in the past 10 years.  Fecal occult blood test (FOBT) of stool. / Every year beginning at age 16 years and continuing until age 5 years. You may not need to do this test if you get a colonoscopy every 10 years.  Flexible sigmoidoscopy or colonoscopy.** / Every 5 years for a flexible sigmoidoscopy or every 10 years for a colonoscopy beginning at age 50 years and continuing until age 45 years.  Hepatitis C blood test.** / For all people born from [birth year] through 1965 and any individual with known risks for hepatitis C.  Osteoporosis screening.** / A one-time screening for women ages 67 years and over and women at risk for fractures or osteoporosis.  Skin self-exam. / Monthly.  Influenza vaccine. / Every year.  Tetanus, diphtheria, and acellular pertussis (Tdap/Td) vaccine.** / 1 dose of Td every 10 years.  Varicella vaccine.** / Consult your health care provider.  Zoster vaccine.** / 1 dose for adults aged 57 years or older.  Pneumococcal 13-valent conjugate (PCV13) vaccine.** / Consult  your health care provider.  Pneumococcal polysaccharide (PPSV23) vaccine.** / 1 dose for all adults aged 56 years and older.  Meningococcal vaccine.** / Consult your health care provider.  Hepatitis A vaccine.** / Consult your health care provider.  Hepatitis B vaccine.** / Consult your health care provider.  Haemophilus influenzae type b (Hib) vaccine.** / Consult your health care provider. ** Family history and personal history of risk and conditions may change your health care provider's recommendations. Document Released: 08/23/2001 Document Revised: 11/11/2013 Document Reviewed: 11/22/2010 [facility] Patient Information 2015 [place], [place]. This information is not intended to replace advice given to you by your health care provider. Make sure you discuss any questions you have with your health care provider.

## 2014-12-10 LAB — CBC
HCT: 39.8 % (ref 36.0–46.0)
Hemoglobin: 13.6 g/dL (ref 12.0–15.0)
MCHC: 34.2 g/dL (ref 30.0–36.0)
MCV: 91.3 fl (ref 78.0–100.0)
PLATELETS: 297 10*3/uL (ref 150.0–400.0)
RBC: 4.36 Mil/uL (ref 3.87–5.11)
RDW: 13.1 % (ref 11.5–15.5)
WBC: 8.9 10*3/uL (ref 4.0–10.5)

## 2014-12-10 LAB — COMPREHENSIVE METABOLIC PANEL
ALBUMIN: 4.3 g/dL (ref 3.5–5.2)
ALT: 19 U/L (ref 0–35)
AST: 18 U/L (ref 0–37)
Alkaline Phosphatase: 91 U/L (ref 39–117)
BUN: 15 mg/dL (ref 6–23)
CALCIUM: 9.8 mg/dL (ref 8.4–10.5)
CHLORIDE: 104 meq/L (ref 96–112)
CO2: 27 mEq/L (ref 19–32)
CREATININE: 0.67 mg/dL (ref 0.40–1.20)
GFR: 99.45 mL/min (ref >60.00)
Glucose, Bld: 72 mg/dL (ref 70–99)
Potassium: 4.3 mEq/L (ref 3.5–5.1)
Sodium: 138 mEq/L (ref 135–145)
TOTAL PROTEIN: 7.1 g/dL (ref 6.0–8.3)
Total Bilirubin: 0.4 mg/dL (ref 0.2–1.2)

## 2014-12-10 LAB — LIPID PANEL
Cholesterol: 190 mg/dL (ref 0–200)
HDL: 53.9 mg/dL (ref >39.00)
LDL Cholesterol: 104 mg/dL — ABNORMAL HIGH (ref 0–99)
NONHDL: 136.1
Total CHOL/HDL Ratio: 4
Triglycerides: 163 mg/dL — ABNORMAL HIGH (ref 0.0–149.0)
VLDL: 32.6 mg/dL (ref 0.0–40.0)

## 2014-12-10 LAB — TSH: TSH: 0.59 u[IU]/mL (ref 0.35–4.50)

## 2014-12-21 ENCOUNTER — Encounter: Payer: Self-pay | Admitting: Family Medicine

## 2014-12-21 NOTE — Assessment & Plan Note (Signed)
Still struggles with pain intermittently but manageable. No changes encouraged stretching ice

## 2014-12-21 NOTE — Progress Notes (Signed)
Jasmin Taylor  [id number] [birth year] 12/21/2014      Progress Note-Follow Up  Subjective  Chief Complaint  Chief Complaint  Patient presents with  . Annual Exam    HPI  Patient is a 49 y.o. female in today for routine medical care. She is doing well. No recent illness. Follows with OB/GYN for her Pap smears. Also follows with Dr. Delman Cheadle dermatology for any dermatologic concerns. Has trouble intermittently with heel pain still but is tolerable. Denies CP/palp/SOB/HA/congestion/fevers/GI or GU c/o. Taking meds as prescribed  Past Medical History  Diagnosis Date  . Hyperlipidemia   . Preventative health care 08/22/2011  . Tinea pedis 12/02/2011  . Cerumen impaction 06/22/2012  . Dysmenorrhea   . Menorrhagia   . Fibroid   . Pain of right heel 07/05/2013  . Plantar fasciitis of right foot 07/05/2013  . Benign paroxysmal positional vertigo 08/25/2014    Past Surgical History  Procedure Laterality Date  . 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 Fibroidectomy    . Hysteroscopic resection  5/04    Family History  Problem Relation Age of Onset  . Diabetes Mother     type 2  . Heart attack Mother 64  . Hypertension Mother   . Hyperlipidemia Mother   . Stroke Mother     ministrokes  . Dementia Mother   . Heart disease Mother     MI 2005, s/p 3 stents  . Congestive Heart Failure Mother   . Heart attack Maternal Grandmother 67  . Diabetes Maternal Grandmother     type 2  . Hypertension Maternal Grandmother   . Hyperlipidemia Maternal Grandmother   . Stroke Maternal Grandfather   . Heart attack Paternal Grandfather   . Heart disease Paternal Grandfather     MI?  Marland Kitchen Hyperlipidemia Sister   . Diabetes Brother     type 2  . Alcohol abuse Brother   . Mental illness Brother   . Heart disease Brother     w/CAD s/p 1 stent  . Diabetes Brother     type 2    History   Social History  . Marital Status: Married    Spouse Name: N/A    . Number of Children: N/A  . Years of Education: N/A   Occupational History  . Not on file.   Social History Main Topics  . Smoking status: Never Smoker   . Smokeless tobacco: Never Used  . Alcohol Use: No  . Drug Use: No  . Sexual Activity:    Partners: Male    Birth Control/ Protection: Pill   Other Topics Concern  . Not on file   Social History Narrative    Current Outpatient Prescriptions on File Prior to Visit  Medication Sig Dispense Refill  . Calcium Carbonate-Vitamin D (CALTRATE 600+D) 600-400 MG-UNIT per tablet Take 1 tablet by mouth daily.    . Multiple Vitamin (MULTIVITAMIN) tablet Take 1 tablet by mouth daily.    . NON FORMULARY daily. Omega red     . Norgestimate-Ethinyl Estradiol Triphasic (ORTHO TRI-CYCLEN LO) 0.18/0.215/0.25 MG-25 MCG tab Take 1 tablet by mouth daily. 3 Package 4  . MELOXICAM PO Take 1 tablet by mouth daily as needed.     No current facility-administered medications on file prior to visit.    No Known Allergies  Review of Systems  Review of Systems  Constitutional: Negative for fever, chills and malaise/fatigue.  HENT: Negative for  congestion, hearing loss and nosebleeds.   Eyes: Negative for discharge.  Respiratory: Negative for cough, sputum production, shortness of breath and wheezing.   Cardiovascular: Negative for chest pain, palpitations and leg swelling.  Gastrointestinal: Negative for heartburn, nausea, vomiting, abdominal pain, diarrhea, constipation and blood in stool.  Genitourinary: Negative for dysuria, urgency, frequency and hematuria.  Musculoskeletal: Negative for myalgias, back pain and falls.  Skin: Negative for rash.  Neurological: Negative for dizziness, tremors, sensory change, focal weakness, loss of consciousness, weakness and headaches.  Endo/Heme/Allergies: Negative for polydipsia. Does not bruise/bleed easily.  Psychiatric/Behavioral: Negative for depression and suicidal ideas. The patient is not  nervous/anxious and does not have insomnia.     Objective  BP 120/84 mmHg  Pulse 76  Temp(Src) 98 F (36.7 C) (Oral)  Ht 5\' 6"  (1.676 m)  Wt 148 lb 4 oz (67.246 kg)  BMI 23.94 kg/m2  SpO2 97%  LMP 12/05/2014  Physical Exam  Physical Exam  Constitutional: She is oriented to person, place, and time and well-developed, well-nourished, and in no distress. No distress.  HENT:  Head: Normocephalic and atraumatic.  Right Ear: External ear normal.  Left Ear: External ear normal.  Nose: Nose normal.  Mouth/Throat: Oropharynx is clear and moist. No oropharyngeal exudate.  Eyes: Conjunctivae are normal. Pupils are equal, round, and reactive to light. Right eye exhibits no discharge. Left eye exhibits no discharge. No scleral icterus.  Neck: Normal range of motion. Neck supple. No thyromegaly present.  Cardiovascular: Normal rate, regular rhythm, normal heart sounds and intact distal pulses.   No murmur heard. Pulmonary/Chest: Effort normal and breath sounds normal. No respiratory distress. She has no wheezes. She has no rales.  Abdominal: Soft. Bowel sounds are normal. She exhibits no distension and no mass. There is no tenderness.  Musculoskeletal: Normal range of motion. She exhibits no edema or tenderness.  Lymphadenopathy:    She has no cervical adenopathy.  Neurological: She is alert and oriented to person, place, and time. She has normal reflexes. No cranial nerve deficit. Coordination normal.  Skin: Skin is warm and dry. No rash noted. She is not diaphoretic.  Psychiatric: Mood, memory and affect normal.    Lab Results  Component Value Date   TSH 0.59 12/09/2014   Lab Results  Component Value Date   WBC 8.9 12/09/2014   HGB 13.6 12/09/2014   HCT 39.8 12/09/2014   MCV 91.3 12/09/2014   PLT 297.0 12/09/2014   Lab Results  Component Value Date   CREATININE 0.67 12/09/2014   BUN 15 12/09/2014   NA 138 12/09/2014   K 4.3 12/09/2014   CL 104 12/09/2014   CO2 27  12/09/2014   Lab Results  Component Value Date   ALT 19 12/09/2014   AST 18 12/09/2014   ALKPHOS 91 12/09/2014   BILITOT 0.4 12/09/2014   Lab Results  Component Value Date   CHOL 190 12/09/2014   Lab Results  Component Value Date   HDL 53.90 12/09/2014   Lab Results  Component Value Date   LDLCALC 104* 12/09/2014   Lab Results  Component Value Date   TRIG 163.0* 12/09/2014   Lab Results  Component Value Date   CHOLHDL 4 12/09/2014     Assessment & Plan  Hyperlipidemia Tolerating statin, encouraged heart healthy diet, avoid trans fats, minimize simple carbs and saturated fats. Increase exercise as tolerated.  Plantar fasciitis of right foot Still struggles with pain intermittently but manageable. No changes encouraged stretching ice  Preventative health care Patient encouraged to maintain heart healthy diet, regular exercise, adequate sleep. Consider daily probiotics. Take medications as prescribed. Labs reviewed. Given and reviewed copy of ACP documents from [employer] and encouraged to complete and return

## 2014-12-21 NOTE — Assessment & Plan Note (Signed)
Patient encouraged to maintain heart healthy diet, regular exercise, adequate sleep. Consider daily probiotics. Take medications as prescribed. Labs reviewed. Given and reviewed copy of ACP documents from [place] Secretary of State and encouraged to complete and return 

## 2014-12-21 NOTE — Assessment & Plan Note (Signed)
Tolerating statin, encouraged heart healthy diet, avoid trans fats, minimize simple carbs and saturated fats. Increase exercise as tolerated 

## 2015-02-23 ENCOUNTER — Telehealth: Payer: Self-pay | Admitting: Family Medicine

## 2015-02-23 NOTE — Telephone Encounter (Signed)
Faxed lab results as requested to number below.  Patient informed

## 2015-02-23 NOTE — Telephone Encounter (Signed)
Relation to pt: self  Call back number: [phone]  Reason for call: patient requesting last lab results please fax to [facility]: Lorn Junes MD fax # [phone]

## 2015-04-06 ENCOUNTER — Ambulatory Visit: Payer: 59 | Admitting: Certified Nurse Midwife

## 2015-04-07 ENCOUNTER — Telehealth: Payer: Self-pay | Admitting: Certified Nurse Midwife

## 2015-04-07 NOTE — Telephone Encounter (Signed)
Patient called to reschedule AEX appointment from 04/10/15 to 04/23/15 with Ms. Debbie due to work conflict.

## 2015-04-10 ENCOUNTER — Ambulatory Visit: Payer: 59 | Admitting: Certified Nurse Midwife

## 2015-04-22 ENCOUNTER — Other Ambulatory Visit: Payer: Self-pay | Admitting: *Deleted

## 2015-04-22 DIAGNOSIS — Z309 Encounter for contraceptive management, unspecified: Secondary | ICD-10-CM

## 2015-04-22 MED ORDER — NORGESTIM-ETH ESTRAD TRIPHASIC 0.18/0.215/0.25 MG-25 MCG PO TABS: 1.0000 | ORAL_TABLET | Freq: Every day | ORAL | Status: DC

## 2015-04-22 NOTE — Telephone Encounter (Signed)
Medication refill request: OCP Last AEX:  03/25/14 DL Next AEX: 04/23/15 DL Last MMG (if hormonal medication request): 11/03/14 BIRADS1:neg Refill authorized: 03/25/14 #3packs/4R. Today #1pack/0?

## 2015-04-23 ENCOUNTER — Encounter: Payer: Self-pay | Admitting: Certified Nurse Midwife

## 2015-04-23 ENCOUNTER — Ambulatory Visit (INDEPENDENT_AMBULATORY_CARE_PROVIDER_SITE_OTHER): Payer: 59 | Admitting: Certified Nurse Midwife

## 2015-04-23 VITALS — BP 114/72 | HR 70 | Resp 16 | Ht 65.75 in | Wt 145.0 lb

## 2015-04-23 DIAGNOSIS — Z309 Encounter for contraceptive management, unspecified: Secondary | ICD-10-CM

## 2015-04-23 DIAGNOSIS — Z01419 Encounter for gynecological examination (general) (routine) without abnormal findings: Secondary | ICD-10-CM | POA: Diagnosis not present

## 2015-04-23 MED ORDER — NORGESTIM-ETH ESTRAD TRIPHASIC 0.18/0.215/0.25 MG-25 MCG PO TABS: 1.0000 | ORAL_TABLET | Freq: Every day | ORAL | Status: DC

## 2015-04-23 NOTE — Patient Instructions (Signed)

## 2015-04-23 NOTE — Progress Notes (Signed)
49 y.o. G0P0000 Married  Caucasian Fe here for annual exam. Periods normal no issues, light. Contraception working well, desires continuance. Recent treatment for plantar fascitis, improving. Sees PCP for aex/labs. All  Normal per patient. No health concerns today.  Patient's last menstrual period was 03/26/2015.          Sexually active: Yes.    The current method of family planning is OCP (estrogen/progesterone).    Exercising: Yes.    walking Smoker:  no  Health Maintenance: Pap:  03-25-14 neg MMG:  11-03-14 category c density,birads 1:neg Colonoscopy:  none BMD:   none TDaP:  2013 Labs: pcp Self breast exam: not done   reports that she has never smoked. She has never used smokeless tobacco. She reports that she does not drink alcohol or use illicit drugs.  Past Medical History  Diagnosis Date  . Hyperlipidemia   . Preventative health care 08/22/2011  . Tinea pedis 12/02/2011  . Cerumen impaction 06/22/2012  . Dysmenorrhea   . Menorrhagia   . Fibroid   . Pain of right heel 07/05/2013  . Plantar fasciitis of right foot 07/05/2013  . Benign paroxysmal positional vertigo 08/25/2014    Past Surgical History  Procedure Laterality Date  . 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 Fibroidectomy    . Hysteroscopic resection  5/04    Current Outpatient Prescriptions  Medication Sig Dispense Refill  . Calcium Carbonate-Vitamin D (CALTRATE 600+D) 600-400 MG-UNIT per tablet Take 1 tablet by mouth as needed.     . MELOXICAM PO Take 1 tablet by mouth daily as needed.    . Multiple Vitamin (MULTIVITAMIN) tablet Take 1 tablet by mouth as needed.     . NON FORMULARY daily. Omega red     . Norgestimate-Ethinyl Estradiol Triphasic (ORTHO TRI-CYCLEN LO) 0.18/0.215/0.25 MG-25 MCG tab Take 1 tablet by mouth daily. 1 Package 0   No current facility-administered medications for this visit.    Family History  Problem Relation Age of Onset  . Diabetes Mother      type 2  . Heart attack Mother 57  . Hypertension Mother   . Hyperlipidemia Mother   . Stroke Mother     ministrokes  . Dementia Mother   . Heart disease Mother     MI 2005, s/p 3 stents  . Congestive Heart Failure Mother   . Heart attack Maternal Grandmother 67  . Diabetes Maternal Grandmother     type 2  . Hypertension Maternal Grandmother   . Hyperlipidemia Maternal Grandmother   . Stroke Maternal Grandfather   . Heart attack Paternal Grandfather   . Heart disease Paternal Grandfather     MI?  Marland Kitchen Hyperlipidemia Sister   . Diabetes Brother     type 2  . Alcohol abuse Brother   . Mental illness Brother   . Heart disease Brother     w/CAD s/p 1 stent  . Diabetes Brother     type 2    ROS:  Pertinent items are noted in HPI.  Otherwise, a comprehensive ROS was negative.  Exam:   BP 114/72 mmHg  Pulse 70  Resp 16  Ht 5' 5.75" (1.67 m)  Wt 145 lb (65.772 kg)  BMI 23.58 kg/m2  LMP 03/26/2015 Height: 5' 5.75" (167 cm) Ht Readings from Last 3 Encounters:  04/23/15 5' 5.75" (1.67 m)  12/09/14 5\' 6"  (1.676 m)  08/18/14 5\' 6"  (1.676 m)  General appearance: alert, cooperative and appears stated age Head: Normocephalic, without obvious abnormality, atraumatic Neck: no adenopathy, supple, symmetrical, trachea midline and thyroid normal to inspection and palpation Lungs: clear to auscultation bilaterally Breasts: normal appearance, no masses or tenderness, No nipple retraction or dimpling, No nipple discharge or bleeding, No axillary or supraclavicular adenopathy Heart: regular rate and rhythm Abdomen: soft, non-tender; no masses,  no organomegaly Extremities: extremities normal, atraumatic, no cyanosis or edema Skin: Skin color, texture, turgor normal. No rashes or lesions Lymph nodes: Cervical, supraclavicular, and axillary nodes normal. No abnormal inguinal nodes palpated Neurologic: Grossly normal   Pelvic: External genitalia:  no lesions              Urethra:   normal appearing urethra with no masses, tenderness or lesions              Bartholin's and Skene's: normal                 Vagina: normal appearing vagina with normal color and discharge, no lesions              Cervix: normal,non tender,no lesions              Pap taken: No. Bimanual Exam:  Uterus:  normal size, contour, position, consistency, mobility, non-tender and mid position              Adnexa: normal adnexa and no mass, fullness, tenderness               Rectovaginal: Confirms               Anus:  normal sphincter tone, no lesions  Chaperone present: yes  A:  Well Woman with normal exam  Contraception OCP desired  Recent treatment for plantar fascitis  P:   Reviewed health and wellness pertinent to exam  Rx Ortho tricyclen lo see order  Pap smear as above not taken   counseled on breast self exam, mammography screening, adequate intake of calcium and vitamin D, diet and exercise  return annually or prn  An After Visit Summary was printed and given to the patient.

## 2015-04-23 NOTE — Progress Notes (Signed)
Encounter reviewed Shyana Kulakowski, MD   

## 2015-12-10 ENCOUNTER — Telehealth: Payer: Self-pay | Admitting: *Deleted

## 2015-12-10 ENCOUNTER — Encounter: Payer: Self-pay | Admitting: *Deleted

## 2015-12-10 NOTE — Telephone Encounter (Signed)
Pre-Visit Call completed with patient and chart updated.   Pre-Visit Info documented in Specialty Comments under [employer].    

## 2015-12-11 ENCOUNTER — Encounter: Payer: 59 | Admitting: Family Medicine

## 2015-12-11 ENCOUNTER — Telehealth: Payer: Self-pay | Admitting: Family Medicine

## 2015-12-15 NOTE — Telephone Encounter (Signed)
No charge. 

## 2015-12-15 NOTE — Telephone Encounter (Signed)
Pt was no show 12/11/15 for cpe, pt confirmed appt 6/1 and completed previsit, pt has not rescheduled, charge or no charge?

## 2015-12-22 ENCOUNTER — Encounter: Payer: Self-pay | Admitting: Family Medicine

## 2015-12-22 ENCOUNTER — Telehealth: Payer: Self-pay | Admitting: Family Medicine

## 2015-12-22 NOTE — Telephone Encounter (Signed)
Letter printed as instructed. Called the patient informed to pickup letter at the front desk.

## 2015-12-22 NOTE — Telephone Encounter (Signed)
Please write her a note that says patient will be out of work this week January 20, 2023 to 2023-01-24 due to the death of an immediate family member.

## 2015-12-22 NOTE — Telephone Encounter (Signed)
°  Relationship to patient: Self  Can be reached: [phone]  Reason for call: Patient request a note to be out of work for this week due to the death of her Mom 12/28/2022. States she needs a note for 6/12-6/16. Plse call patient if she needs to come in before note can be written.

## 2015-12-24 ENCOUNTER — Telehealth: Payer: Self-pay | Admitting: Family Medicine

## 2015-12-24 NOTE — Telephone Encounter (Signed)
Pt called today wanting to know if she can get another note for work asking for another week off of work from 6-19 to 6-23, pt states still not feeling well yet to return to work after her situation mentioned below. Pt states also has father in the hospital. If possible to have note for work with additional wk. Please advise.   Pt tel num [phone]. Please advise.

## 2015-12-24 NOTE — Telephone Encounter (Signed)
I am OK to write her another note for work but I would advise her that with the extended time away some work places look more favorably on the note if I have seen patient. I would offer her an appt next week if she can make it.

## 2015-12-24 NOTE — Telephone Encounter (Signed)
Called to discuss provider's recommendations.  Left a message for call back.

## 2015-12-24 NOTE — Telephone Encounter (Signed)
Error

## 2015-12-25 NOTE — Telephone Encounter (Signed)
Pt returned call and stated that she does not think that her job will require that she sees her provider, but will call back to schedule appt if her job deems it necessary.   New letter printed and mailed to patient as requested.

## 2016-01-02 LAB — HM MAMMOGRAPHY

## 2016-01-05 ENCOUNTER — Encounter: Payer: Self-pay | Admitting: Family Medicine

## 2016-04-29 ENCOUNTER — Encounter: Payer: Self-pay | Admitting: Certified Nurse Midwife

## 2016-04-29 ENCOUNTER — Ambulatory Visit (INDEPENDENT_AMBULATORY_CARE_PROVIDER_SITE_OTHER): Payer: 59 | Admitting: Certified Nurse Midwife

## 2016-04-29 VITALS — BP 112/70 | HR 72 | Resp 16 | Ht 65.5 in | Wt 146.0 lb

## 2016-04-29 DIAGNOSIS — Z3041 Encounter for surveillance of contraceptive pills: Secondary | ICD-10-CM | POA: Diagnosis not present

## 2016-04-29 DIAGNOSIS — Z01419 Encounter for gynecological examination (general) (routine) without abnormal findings: Secondary | ICD-10-CM

## 2016-04-29 DIAGNOSIS — Z124 Encounter for screening for malignant neoplasm of cervix: Secondary | ICD-10-CM | POA: Diagnosis not present

## 2016-04-29 LAB — HM PAP SMEAR: HM PAP: NEGATIVE

## 2016-04-29 MED ORDER — NORGESTIM-ETH ESTRAD TRIPHASIC 0.18/0.215/0.25 MG-25 MCG PO TABS: 1.0000 | ORAL_TABLET | Freq: Every day | ORAL | 4 refills | Status: DC

## 2016-04-29 NOTE — Progress Notes (Signed)
50 y.o. G0P0000 Married  Caucasian Fe here for annual exam.Periods normal, no issues. Sees Dr. Charlett Blake yearly for aex and labs. Social stress recently with death of mother. Emotionally doing "Ok". OCP working well for contraception.  Busy with new puppy. Some vaginal dryness, plans to try astroglide to see if helps. No other health concerns today.      Patient's last menstrual period was 04/22/2016.          Sexually active: Yes.    The current method of family planning is OCP (estrogen/progesterone).    Exercising: Yes.    walking Smoker:  no  Health Maintenance: Pap:  03-25-14 neg MMG:  01-02-16 category b density birads 1:neg Colonoscopy:  Not done BMD:  none TDaP:  2013 Shingles: no Pneumonia: no Hep C and HIV: not done Labs: pcp Self breast exam: not done   reports that she has never smoked. She has never used smokeless tobacco. She reports that she does not drink alcohol or use drugs.  Past Medical History:  Diagnosis Date  . Benign paroxysmal positional vertigo 08/25/2014  . Cerumen impaction 06/22/2012  . Dysmenorrhea   . Fibroid   . Hyperlipidemia   . Menorrhagia   . Pain of right heel 07/05/2013  . Plantar fasciitis of right foot 07/05/2013  . Preventative health care 08/22/2011  . Tinea pedis 12/02/2011    Past Surgical History:  Procedure Laterality Date  . 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 fibroidectomy    . hysteroscopic resection  5/04    Current Outpatient Prescriptions  Medication Sig Dispense Refill  . GARLIC PO Take by mouth daily.    . meloxicam (MOBIC) 15 MG tablet Take 15 mg by mouth as needed.  12  . Multiple Vitamin (MULTIVITAMIN) tablet Take 1 tablet by mouth as needed.     . NON FORMULARY daily. Omega red     . Norgestimate-Ethinyl Estradiol Triphasic (ORTHO TRI-CYCLEN LO) 0.18/0.215/0.25 MG-25 MCG tab Take 1 tablet by mouth daily. 3 Package 4   No current facility-administered medications for this visit.      Family History  Problem Relation Age of Onset  . Diabetes Mother     type 2  . Heart attack Mother 47  . Hypertension Mother   . Hyperlipidemia Mother   . Stroke Mother     ministrokes  . Dementia Mother   . Heart disease Mother     MI 2005, s/p 3 stents  . Congestive Heart Failure Mother   . Kidney disease Mother   . Heart attack Maternal Grandmother 67  . Diabetes Maternal Grandmother     type 2  . Hypertension Maternal Grandmother   . Hyperlipidemia Maternal Grandmother   . Stroke Maternal Grandfather   . Heart attack Paternal Grandfather   . Heart disease Paternal Grandfather     MI?  Marland Kitchen Hyperlipidemia Sister   . Diabetes Brother     type 2  . Alcohol abuse Brother   . Mental illness Brother   . Hyperlipidemia Brother   . Heart disease Brother     w/CAD s/p 1 stent  . Diabetes Brother     type 2    ROS:  Pertinent items are noted in HPI.  Otherwise, a comprehensive ROS was negative.  Exam:   BP 112/70   Pulse 72   Resp 16   Ht 5' 5.5" (1.664 m)   Wt 146 lb (66.2 kg)  LMP 04/22/2016   BMI 23.93 kg/m  Height: 5' 5.5" (166.4 cm) Ht Readings from Last 3 Encounters:  04/29/16 5' 5.5" (1.664 m)  04/23/15 5' 5.75" (1.67 m)  12/09/14 5\' 6"  (1.676 m)    General appearance: alert, cooperative and appears stated age Head: Normocephalic, without obvious abnormality, atraumatic Neck: no adenopathy, supple, symmetrical, trachea midline and thyroid normal to inspection and palpation Lungs: clear to auscultation bilaterally Breasts: normal appearance, no masses or tenderness, No nipple retraction or dimpling, No nipple discharge or bleeding, No axillary or supraclavicular adenopathy Heart: regular rate and rhythm Abdomen: soft, non-tender; no masses,  no organomegaly Extremities: extremities normal, atraumatic, no cyanosis or edema Skin: Skin color, texture, turgor normal. No rashes or lesions Lymph nodes: Cervical, supraclavicular, and axillary nodes  normal. No abnormal inguinal nodes palpated Neurologic: Grossly normal   Pelvic: External genitalia:  no lesions              Urethra:  normal appearing urethra with no masses, tenderness or lesions              Bartholin's and Skene's: normal                 Vagina: normal appearing vagina with normal color and discharge, no lesions              Cervix: no cervical motion tenderness, no lesions and no bleeding with pap              Pap taken: Yes.   Bimanual Exam:  Uterus:  normal size, contour, position, consistency, mobility, non-tender              Adnexa: normal adnexa and no mass, fullness, tenderness               Rectovaginal: Confirms               Anus:  normal sphincter tone, no lesions  Chaperone present: yes  A:  Well Woman with normal exam  Contraception OCP desired  Social stress with death of mother  Vaginal dryness plans OTC lubricant her choice  P:   Reviewed health and wellness pertinent to exam  Rx Ortho tricyclen see order with instructions  Encouraged to seek family and friend support as needed  Pap smear as above with HPVHR   counseled on breast self exam, mammography screening, adequate intake of calcium and vitamin D, diet and exercise  return annually or prn  An After Visit Summary was printed and given to the patient.

## 2016-04-29 NOTE — Patient Instructions (Signed)

## 2016-05-04 LAB — IPS PAP TEST WITH HPV

## 2016-05-06 NOTE — Progress Notes (Signed)
Encounter reviewed Suhani Stillion, MD   

## 2016-05-17 ENCOUNTER — Encounter: Payer: Self-pay | Admitting: Family Medicine

## 2016-05-17 ENCOUNTER — Ambulatory Visit (INDEPENDENT_AMBULATORY_CARE_PROVIDER_SITE_OTHER): Payer: 59 | Admitting: Family Medicine

## 2016-05-17 DIAGNOSIS — Z0001 Encounter for general adult medical examination with abnormal findings: Secondary | ICD-10-CM

## 2016-05-17 DIAGNOSIS — Z23 Encounter for immunization: Secondary | ICD-10-CM | POA: Diagnosis not present

## 2016-05-17 DIAGNOSIS — E782 Mixed hyperlipidemia: Secondary | ICD-10-CM | POA: Diagnosis not present

## 2016-05-17 DIAGNOSIS — Z0000 Encounter for general adult medical examination without abnormal findings: Secondary | ICD-10-CM

## 2016-05-17 DIAGNOSIS — M7711 Lateral epicondylitis, right elbow: Secondary | ICD-10-CM | POA: Insufficient documentation

## 2016-05-17 NOTE — Patient Instructions (Addendum)
Ice twice daily Lidocaine patches or gel as needed, Aspercreme, Salon Pas or [facility] for Adults, Female A healthy lifestyle and preventive care can promote health and wellness. Preventive health guidelines for women include the following key practices.  A routine yearly physical is a good way to check with your health care provider about your health and preventive screening. It is a chance to share any concerns and updates on your health and to receive a thorough exam.  Visit your dentist for a routine exam and preventive care every 6 months. Brush your teeth twice a day and floss once a day. Good oral hygiene prevents tooth decay and gum disease.  The frequency of eye exams is based on your age, health, family medical history, use of contact lenses, and other factors. Follow your health care provider's recommendations for frequency of eye exams.  Eat a healthy diet. Foods like vegetables, fruits, whole grains, low-fat dairy products, and lean protein foods contain the nutrients you need without too many calories. Decrease your intake of foods high in solid fats, added sugars, and salt. Eat the right amount of calories for you.Get information about a proper diet from your health care provider, if necessary.  Regular physical exercise is one of the most important things you can do for your health. Most adults should get at least 150 minutes of moderate-intensity exercise (any activity that increases your heart rate and causes you to sweat) each week. In addition, most adults need muscle-strengthening exercises on 2 or more days a week.  Maintain a healthy weight. The body mass index (BMI) is a screening tool to identify possible weight problems. It provides an estimate of body fat based on height and weight. Your health care provider can find your BMI and can help you achieve or maintain a healthy weight.For adults 20 years and older:  A BMI below 18.5 is considered  underweight.  A BMI of 18.5 to 24.9 is normal.  A BMI of 25 to 29.9 is considered overweight.  A BMI of 30 and above is considered obese.  Maintain normal blood lipids and cholesterol levels by exercising and minimizing your intake of saturated fat. Eat a balanced diet with plenty of fruit and vegetables. Blood tests for lipids and cholesterol should begin at age 14 and be repeated every 5 years. If your lipid or cholesterol levels are high, you are over 50, or you are at high risk for heart disease, you may need your cholesterol levels checked more frequently.Ongoing high lipid and cholesterol levels should be treated with medicines if diet and exercise are not working.  If you smoke, find out from your health care provider how to quit. If you do not use tobacco, do not start.  Lung cancer screening is recommended for adults aged 78-80 years who are at high risk for developing lung cancer because of a history of smoking. A yearly low-dose CT scan of the lungs is recommended for people who have at least a 30-pack-year history of smoking and are a current smoker or have quit within the past 15 years. A pack year of smoking is smoking an average of 1 pack of cigarettes a day for 1 year (for example: 1 pack a day for 30 years or 2 packs a day for 15 years). Yearly screening should continue until the smoker has stopped smoking for at least 15 years. Yearly screening should be stopped for people who develop a health problem that would prevent them  from having lung cancer treatment.  If you are pregnant, do not drink alcohol. If you are breastfeeding, be very cautious about drinking alcohol. If you are not pregnant and choose to drink alcohol, do not have more than 1 drink per day. One drink is considered to be 12 ounces (355 mL) of beer, 5 ounces (148 mL) of wine, or 1.5 ounces (44 mL) of liquor.  Avoid use of street drugs. Do not share needles with anyone. Ask for help if you need support or  instructions about stopping the use of drugs.  High blood pressure causes heart disease and increases the risk of stroke. Your blood pressure should be checked at least every 1 to 2 years. Ongoing high blood pressure should be treated with medicines if weight loss and exercise do not work.  If you are 55-79 years old, ask your health care provider if you should take aspirin to prevent strokes.  Diabetes screening is done by taking a blood sample to check your blood glucose level after you have not eaten for a certain period of time (fasting). If you are not overweight and you do not have risk factors for diabetes, you should be screened once every 3 years starting at age 45. If you are overweight or obese and you are 40-70 years of age, you should be screened for diabetes every year as part of your cardiovascular risk assessment.  Breast cancer screening is essential preventive care for women. You should practice "breast self-awareness." This means understanding the normal appearance and feel of your breasts and may include breast self-examination. Any changes detected, no matter how small, should be reported to a health care provider. Women in their 20s and 30s should have a clinical breast exam (CBE) by a health care provider as part of a regular health exam every 1 to 3 years. After age 40, women should have a CBE every year. Starting at age 40, women should consider having a mammogram (breast X-ray test) every year. Women who have a family history of breast cancer should talk to their health care provider about genetic screening. Women at a high risk of breast cancer should talk to their health care providers about having an MRI and a mammogram every year.  Breast cancer gene (BRCA)-related cancer risk assessment is recommended for women who have family members with BRCA-related cancers. BRCA-related cancers include breast, ovarian, tubal, and peritoneal cancers. Having family members with these  cancers may be associated with an increased risk for harmful changes (mutations) in the breast cancer genes BRCA1 and BRCA2. Results of the assessment will determine the need for genetic counseling and BRCA1 and BRCA2 testing.  Your health care provider may recommend that you be screened regularly for cancer of the pelvic organs (ovaries, uterus, and vagina). This screening involves a pelvic examination, including checking for microscopic changes to the surface of your cervix (Pap test). You may be encouraged to have this screening done every 3 years, beginning at age 21.  For women ages 30-65, health care providers may recommend pelvic exams and Pap testing every 3 years, or they may recommend the Pap and pelvic exam, combined with testing for human papilloma virus (HPV), every 5 years. Some types of HPV increase your risk of cervical cancer. Testing for HPV may also be done on women of any age with unclear Pap test results.  Other health care providers may not recommend any screening for nonpregnant women who are considered low risk for pelvic cancer and who   do not have symptoms. Ask your health care provider if a screening pelvic exam is right for you.  If you have had past treatment for cervical cancer or a condition that could lead to cancer, you need Pap tests and screening for cancer for at least 20 years after your treatment. If Pap tests have been discontinued, your risk factors (such as having a new sexual partner) need to be reassessed to determine if screening should resume. Some women have medical problems that increase the chance of getting cervical cancer. In these cases, your health care provider may recommend more frequent screening and Pap tests.  Colorectal cancer can be detected and often prevented. Most routine colorectal cancer screening begins at the age of 50 years and continues through age 75 years. However, your health care provider may recommend screening at an earlier age if you  have risk factors for colon cancer. On a yearly basis, your health care provider may provide home test kits to check for hidden blood in the stool. Use of a small camera at the end of a tube, to directly examine the colon (sigmoidoscopy or colonoscopy), can detect the earliest forms of colorectal cancer. Talk to your health care provider about this at age 50, when routine screening begins. Direct exam of the colon should be repeated every 5-10 years through age 75 years, unless early forms of precancerous polyps or small growths are found.  People who are at an increased risk for hepatitis B should be screened for this virus. You are considered at high risk for hepatitis B if:  You were born in a country where hepatitis B occurs often. Talk with your health care provider about which countries are considered high risk.  Your parents were born in a high-risk country and you have not received a shot to protect against hepatitis B (hepatitis B vaccine).  You have HIV or AIDS.  You use needles to inject street drugs.  You live with, or have sex with, someone who has hepatitis B.  You get hemodialysis treatment.  You take certain medicines for conditions like cancer, organ transplantation, and autoimmune conditions.  Hepatitis C blood testing is recommended for all people born from 1945 through 1965 and any individual with known risks for hepatitis C.  Practice safe sex. Use condoms and avoid high-risk sexual practices to reduce the spread of sexually transmitted infections (STIs). STIs include gonorrhea, chlamydia, syphilis, trichomonas, herpes, HPV, and human immunodeficiency virus (HIV). Herpes, HIV, and HPV are viral illnesses that have no cure. They can result in disability, cancer, and death.  You should be screened for sexually transmitted illnesses (STIs) including gonorrhea and chlamydia if:  You are sexually active and are younger than 24 years.  You are older than 24 years and your  health care provider tells you that you are at risk for this type of infection.  Your sexual activity has changed since you were last screened and you are at an increased risk for chlamydia or gonorrhea. Ask your health care provider if you are at risk.  If you are at risk of being infected with HIV, it is recommended that you take a prescription medicine daily to prevent HIV infection. This is called preexposure prophylaxis (PrEP). You are considered at risk if:  You are sexually active and do not regularly use condoms or know the HIV status of your partner(s).  You take drugs by injection.  You are sexually active with a partner who has HIV.  Talk with   your health care provider about whether you are at high risk of being infected with HIV. If you choose to begin PrEP, you should first be tested for HIV. You should then be tested every 3 months for as long as you are taking PrEP.  Osteoporosis is a disease in which the bones lose minerals and strength with aging. This can result in serious bone fractures or breaks. The risk of osteoporosis can be identified using a bone density scan. Women ages 87 years and over and women at risk for fractures or osteoporosis should discuss screening with their health care providers. Ask your health care provider whether you should take a calcium supplement or vitamin D to reduce the rate of osteoporosis.  Menopause can be associated with physical symptoms and risks. Hormone replacement therapy is available to decrease symptoms and risks. You should talk to your health care provider about whether hormone replacement therapy is right for you.  Use sunscreen. Apply sunscreen liberally and repeatedly throughout the day. You should seek shade when your shadow is shorter than you. Protect yourself by wearing long sleeves, pants, a wide-brimmed hat, and sunglasses year round, whenever you are outdoors.  Once a month, do a whole body skin exam, using a mirror to look  at the skin on your back. Tell your health care provider of new moles, moles that have irregular borders, moles that are larger than a pencil eraser, or moles that have changed in shape or color.  Stay current with required vaccines (immunizations).  Influenza vaccine. All adults should be immunized every year.  Tetanus, diphtheria, and acellular pertussis (Td, Tdap) vaccine. Pregnant women should receive 1 dose of Tdap vaccine during each pregnancy. The dose should be obtained regardless of the length of time since the last dose. Immunization is preferred during the 27th-36th week of gestation. An adult who has not previously received Tdap or who does not know her vaccine status should receive 1 dose of Tdap. This initial dose should be followed by tetanus and diphtheria toxoids (Td) booster doses every 10 years. Adults with an unknown or incomplete history of completing a 3-dose immunization series with Td-containing vaccines should begin or complete a primary immunization series including a Tdap dose. Adults should receive a Td booster every 10 years.  Varicella vaccine. An adult without evidence of immunity to varicella should receive 2 doses or a second dose if she has previously received 1 dose. Pregnant females who do not have evidence of immunity should receive the first dose after pregnancy. This first dose should be obtained before leaving the health care facility. The second dose should be obtained 4-8 weeks after the first dose.  Human papillomavirus (HPV) vaccine. Females aged 13-26 years who have not received the vaccine previously should obtain the 3-dose series. The vaccine is not recommended for use in pregnant females. However, pregnancy testing is not needed before receiving a dose. If a female is found to be pregnant after receiving a dose, no treatment is needed. In that case, the remaining doses should be delayed until after the pregnancy. Immunization is recommended for any person  with an immunocompromised condition through the age of 24 years if she did not get any or all doses earlier. During the 3-dose series, the second dose should be obtained 4-8 weeks after the first dose. The third dose should be obtained 24 weeks after the first dose and 16 weeks after the second dose.  Zoster vaccine. One dose is recommended for adults aged 45  years or older unless certain conditions are present.  Measles, mumps, and rubella (MMR) vaccine. Adults born before 1957 generally are considered immune to measles and mumps. Adults born in 1957 or later should have 1 or more doses of MMR vaccine unless there is a contraindication to the vaccine or there is laboratory evidence of immunity to each of the three diseases. A routine second dose of MMR vaccine should be obtained at least 28 days after the first dose for students attending postsecondary schools, health care workers, or international travelers. People who received inactivated measles vaccine or an unknown type of measles vaccine during 1963-1967 should receive 2 doses of MMR vaccine. People who received inactivated mumps vaccine or an unknown type of mumps vaccine before 1979 and are at high risk for mumps infection should consider immunization with 2 doses of MMR vaccine. For females of childbearing age, rubella immunity should be determined. If there is no evidence of immunity, females who are not pregnant should be vaccinated. If there is no evidence of immunity, females who are pregnant should delay immunization until after pregnancy. Unvaccinated health care workers born before 1957 who lack laboratory evidence of measles, mumps, or rubella immunity or laboratory confirmation of disease should consider measles and mumps immunization with 2 doses of MMR vaccine or rubella immunization with 1 dose of MMR vaccine.  Pneumococcal 13-valent conjugate (PCV13) vaccine. When indicated, a person who is uncertain of his immunization history and has  no record of immunization should receive the PCV13 vaccine. All adults 65 years of age and older should receive this vaccine. An adult aged 19 years or older who has certain medical conditions and has not been previously immunized should receive 1 dose of PCV13 vaccine. This PCV13 should be followed with a dose of pneumococcal polysaccharide (PPSV23) vaccine. Adults who are at high risk for pneumococcal disease should obtain the PPSV23 vaccine at least 8 weeks after the dose of PCV13 vaccine. Adults older than 50 years of age who have normal immune system function should obtain the PPSV23 vaccine dose at least 1 year after the dose of PCV13 vaccine.  Pneumococcal polysaccharide (PPSV23) vaccine. When PCV13 is also indicated, PCV13 should be obtained first. All adults aged 65 years and older should be immunized. An adult younger than age 65 years who has certain medical conditions should be immunized. Any person who resides in a nursing home or long-term care facility should be immunized. An adult smoker should be immunized. People with an immunocompromised condition and certain other conditions should receive both PCV13 and PPSV23 vaccines. People with human immunodeficiency virus (HIV) infection should be immunized as soon as possible after diagnosis. Immunization during chemotherapy or radiation therapy should be avoided. Routine use of PPSV23 vaccine is not recommended for American Indians, [place] Natives, or people younger than 65 years unless there are medical conditions that require PPSV23 vaccine. When indicated, people who have unknown immunization and have no record of immunization should receive PPSV23 vaccine. One-time revaccination 5 years after the first dose of PPSV23 is recommended for people aged 19-64 years who have chronic kidney failure, nephrotic syndrome, asplenia, or immunocompromised conditions. People who received 1-2 doses of PPSV23 before age 65 years should receive another dose of PPSV23  vaccine at age 65 years or later if at least 5 years have passed since the previous dose. Doses of PPSV23 are not needed for people immunized with PPSV23 at or after age 65 years.  Meningococcal vaccine. Adults with asplenia or persistent complement   component deficiencies should receive 2 doses of quadrivalent meningococcal conjugate (MenACWY-D) vaccine. The doses should be obtained at least 2 months apart. Microbiologists working with certain meningococcal bacteria, military recruits, people at risk during an outbreak, and people who travel to or live in countries with a high rate of meningitis should be immunized. A first-year college student up through age 21 years who is living in a residence hall should receive a dose if she did not receive a dose on or after her 16th birthday. Adults who have certain high-risk conditions should receive one or more doses of vaccine.  Hepatitis A vaccine. Adults who wish to be protected from this disease, have certain high-risk conditions, work with hepatitis A-infected animals, work in hepatitis A research labs, or travel to or work in countries with a high rate of hepatitis A should be immunized. Adults who were previously unvaccinated and who anticipate close contact with an international adoptee during the first 60 days after arrival in the [place] from a country with a high rate of hepatitis A should be immunized.  Hepatitis B vaccine. Adults who wish to be protected from this disease, have certain high-risk conditions, may be exposed to blood or other infectious body fluids, are household contacts or sex partners of hepatitis B positive people, are clients or workers in certain care facilities, or travel to or work in countries with a high rate of hepatitis B should be immunized.  Haemophilus influenzae type b (Hib) vaccine. A previously unvaccinated person with asplenia or sickle cell disease or having a scheduled splenectomy should receive 1 dose of Hib  vaccine. Regardless of previous immunization, a recipient of a hematopoietic stem cell transplant should receive a 3-dose series 6-12 months after her successful transplant. Hib vaccine is not recommended for adults with HIV infection. Preventive Services / Frequency Ages 19 to 39 years  Blood pressure check.** / Every 3-5 years.  Lipid and cholesterol check.** / Every 5 years beginning at age 20.  Clinical breast exam.** / Every 3 years for women in their 20s and 30s.  BRCA-related cancer risk assessment.** / For women who have family members with a BRCA-related cancer (breast, ovarian, tubal, or peritoneal cancers).  Pap test.** / Every 2 years from ages 21 through 29. Every 3 years starting at age 30 through age 65 or 70 with a history of 3 consecutive normal Pap tests.  HPV screening.** / Every 3 years from ages 30 through ages 65 to 70 with a history of 3 consecutive normal Pap tests.  Hepatitis C blood test.** / For any individual with known risks for hepatitis C.  Skin self-exam. / Monthly.  Influenza vaccine. / Every year.  Tetanus, diphtheria, and acellular pertussis (Tdap, Td) vaccine.** / Consult your health care provider. Pregnant women should receive 1 dose of Tdap vaccine during each pregnancy. 1 dose of Td every 10 years.  Varicella vaccine.** / Consult your health care provider. Pregnant females who do not have evidence of immunity should receive the first dose after pregnancy.  HPV vaccine. / 3 doses over 6 months, if 26 and younger. The vaccine is not recommended for use in pregnant females. However, pregnancy testing is not needed before receiving a dose.  Measles, mumps, rubella (MMR) vaccine.** / You need at least 1 dose of MMR if you were born in 1957 or later. You may also need a 2nd dose. For females of childbearing age, rubella immunity should be determined. If there is no evidence of immunity, females   who are not pregnant should be vaccinated. If there is no  evidence of immunity, females who are pregnant should delay immunization until after pregnancy.  Pneumococcal 13-valent conjugate (PCV13) vaccine.** / Consult your health care provider.  Pneumococcal polysaccharide (PPSV23) vaccine.** / 1 to 2 doses if you smoke cigarettes or if you have certain conditions.  Meningococcal vaccine.** / 1 dose if you are age 19 to 21 years and a first-year college student living in a residence hall, or have one of several medical conditions, you need to get vaccinated against meningococcal disease. You may also need additional booster doses.  Hepatitis A vaccine.** / Consult your health care provider.  Hepatitis B vaccine.** / Consult your health care provider.  Haemophilus influenzae type b (Hib) vaccine.** / Consult your health care provider. Ages 40 to 64 years  Blood pressure check.** / Every year.  Lipid and cholesterol check.** / Every 5 years beginning at age 20 years.  Lung cancer screening. / Every year if you are aged 55-80 years and have a 30-pack-year history of smoking and currently smoke or have quit within the past 15 years. Yearly screening is stopped once you have quit smoking for at least 15 years or develop a health problem that would prevent you from having lung cancer treatment.  Clinical breast exam.** / Every year after age 40 years.  BRCA-related cancer risk assessment.** / For women who have family members with a BRCA-related cancer (breast, ovarian, tubal, or peritoneal cancers).  Mammogram.** / Every year beginning at age 40 years and continuing for as long as you are in good health. Consult with your health care provider.  Pap test.** / Every 3 years starting at age 30 years through age 65 or 70 years with a history of 3 consecutive normal Pap tests.  HPV screening.** / Every 3 years from ages 30 years through ages 65 to 70 years with a history of 3 consecutive normal Pap tests.  Fecal occult blood test (FOBT) of stool. /  Every year beginning at age 50 years and continuing until age 75 years. You may not need to do this test if you get a colonoscopy every 10 years.  Flexible sigmoidoscopy or colonoscopy.** / Every 5 years for a flexible sigmoidoscopy or every 10 years for a colonoscopy beginning at age 50 years and continuing until age 75 years.  Hepatitis C blood test.** / For all people born from 1945 through 1965 and any individual with known risks for hepatitis C.  Skin self-exam. / Monthly.  Influenza vaccine. / Every year.  Tetanus, diphtheria, and acellular pertussis (Tdap/Td) vaccine.** / Consult your health care provider. Pregnant women should receive 1 dose of Tdap vaccine during each pregnancy. 1 dose of Td every 10 years.  Varicella vaccine.** / Consult your health care provider. Pregnant females who do not have evidence of immunity should receive the first dose after pregnancy.  Zoster vaccine.** / 1 dose for adults aged 60 years or older.  Measles, mumps, rubella (MMR) vaccine.** / You need at least 1 dose of MMR if you were born in 1957 or later. You may also need a second dose. For females of childbearing age, rubella immunity should be determined. If there is no evidence of immunity, females who are not pregnant should be vaccinated. If there is no evidence of immunity, females who are pregnant should delay immunization until after pregnancy.  Pneumococcal 13-valent conjugate (PCV13) vaccine.** / Consult your health care provider.  Pneumococcal polysaccharide (PPSV23) vaccine.** / 1   to 2 doses if you smoke cigarettes or if you have certain conditions.  Meningococcal vaccine.** / Consult your health care provider.  Hepatitis A vaccine.** / Consult your health care provider.  Hepatitis B vaccine.** / Consult your health care provider.  Haemophilus influenzae type b (Hib) vaccine.** / Consult your health care provider. Ages 33 years and over  Blood pressure check.** / Every year.  Lipid  and cholesterol check.** / Every 5 years beginning at age 25 years.  Lung cancer screening. / Every year if you are aged 67-80 years and have a 30-pack-year history of smoking and currently smoke or have quit within the past 15 years. Yearly screening is stopped once you have quit smoking for at least 15 years or develop a health problem that would prevent you from having lung cancer treatment.  Clinical breast exam.** / Every year after age 39 years.  BRCA-related cancer risk assessment.** / For women who have family members with a BRCA-related cancer (breast, ovarian, tubal, or peritoneal cancers).  Mammogram.** / Every year beginning at age 56 years and continuing for as long as you are in good health. Consult with your health care provider.  Pap test.** / Every 3 years starting at age 22 years through age 74 or 48 years with 3 consecutive normal Pap tests. Testing can be stopped between 65 and 70 years with 3 consecutive normal Pap tests and no abnormal Pap or HPV tests in the past 10 years.  HPV screening.** / Every 3 years from ages 69 years through ages 76 or 69 years with a history of 3 consecutive normal Pap tests. Testing can be stopped between 65 and 70 years with 3 consecutive normal Pap tests and no abnormal Pap or HPV tests in the past 10 years.  Fecal occult blood test (FOBT) of stool. / Every year beginning at age 43 years and continuing until age 59 years. You may not need to do this test if you get a colonoscopy every 10 years.  Flexible sigmoidoscopy or colonoscopy.** / Every 5 years for a flexible sigmoidoscopy or every 10 years for a colonoscopy beginning at age 78 years and continuing until age 88 years.  Hepatitis C blood test.** / For all people born from [birth year] through 1965 and any individual with known risks for hepatitis C.  Osteoporosis screening.** / A one-time screening for women ages 71 years and over and women at risk for fractures or osteoporosis.  Skin  self-exam. / Monthly.  Influenza vaccine. / Every year.  Tetanus, diphtheria, and acellular pertussis (Tdap/Td) vaccine.** / 1 dose of Td every 10 years.  Varicella vaccine.** / Consult your health care provider.  Zoster vaccine.** / 1 dose for adults aged 62 years or older.  Pneumococcal 13-valent conjugate (PCV13) vaccine.** / Consult your health care provider.  Pneumococcal polysaccharide (PPSV23) vaccine.** / 1 dose for all adults aged 32 years and older.  Meningococcal vaccine.** / Consult your health care provider.  Hepatitis A vaccine.** / Consult your health care provider.  Hepatitis B vaccine.** / Consult your health care provider.  Haemophilus influenzae type b (Hib) vaccine.** / Consult your health care provider. ** Family history and personal history of risk and conditions may change your health care provider's recommendations.   This information is not intended to replace advice given to you by your health care provider. Make sure you discuss any questions you have with your health care provider.   Document Released: 08/23/2001 Document Revised: 07/18/2014 Document Reviewed: 11/22/2010 Elsevier Interactive  Patient Education 2016 [employer].

## 2016-05-17 NOTE — Assessment & Plan Note (Signed)
Encouraged heart healthy diet, increase exercise, avoid trans fats, consider a krill oil cap daily 

## 2016-05-17 NOTE — Progress Notes (Signed)
Patient ID: Jasmin Taylor, female   DOB: [birth year], 50 y.o.   MRN: [id number]   Subjective:    Patient ID: Jasmin Taylor, female    DOB: August 22, 1965, 50 y.o.   MRN: [id number]  No chief complaint on file.   HPI Patient is in today for an annual exam and c/o elbow pain due to work. Right elbow bothers her most with movement at work. No falls or redness. No warmth or swelling. 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 Has a great deal of repetitive movements in her job. Denies CP/palp/SOB/HA/congestion/fevers/GI or GU c/o. Taking meds as prescribed. Maintains a heart healthy diet and stays active  Past Medical History:  Diagnosis Date  . Benign paroxysmal positional vertigo 08/25/2014  . Cerumen impaction 06/22/2012  . Dysmenorrhea   . Fibroid   . Hyperlipidemia   . Menorrhagia   . Pain of right heel 07/05/2013  . Plantar fasciitis of right foot 07/05/2013  . Preventative health care 08/22/2011  . Tinea pedis 12/02/2011    Past Surgical History:  Procedure Laterality Date  . 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 fibroidectomy    . hysteroscopic resection  5/04    Family History  Problem Relation Age of Onset  . Diabetes Mother     type 2  . Heart attack Mother 9  . Hypertension Mother   . Hyperlipidemia Mother   . Stroke Mother     ministrokes  . Dementia Mother   . Heart disease Mother     MI 2005, s/p 3 stents  . Congestive Heart Failure Mother   . Kidney disease Mother   . Heart attack Maternal Grandmother 67  . Diabetes Maternal Grandmother     type 2  . Hypertension Maternal Grandmother   . Hyperlipidemia Maternal Grandmother   . Stroke Maternal Grandfather   . Heart attack Paternal Grandfather   . Heart disease Paternal Grandfather     MI?  Marland Kitchen Hyperlipidemia Sister   . Diabetes Brother     type 2  . Alcohol abuse Brother   . Mental illness Brother   . Hyperlipidemia Brother   . Heart disease Brother     w/CAD s/p  1 stent  . Diabetes Brother     type 2    Social History   Social History  . Marital status: Married    Spouse name: N/A  . Number of children: N/A  . Years of education: N/A   Occupational History  . Not on file.   Social History Main Topics  . Smoking status: Never Smoker  . Smokeless tobacco: Never Used  . Alcohol use No  . Drug use: No  . Sexual activity: Yes    Partners: Male    Birth control/ protection: Pill   Other Topics Concern  . Not on file   Social History Narrative   No major dietary restrictions   Walks the dogs, 2    Works for the [employer]      Lives with husband       Outpatient Medications Prior to Visit  Medication Sig Dispense Refill  . GARLIC PO Take by mouth daily.    . meloxicam (MOBIC) 15 MG tablet Take 15 mg by mouth as needed.  12  . Multiple Vitamin (MULTIVITAMIN) tablet Take 1 tablet by mouth as needed.     . NON FORMULARY daily. Omega red     .  Norgestimate-Ethinyl Estradiol Triphasic (ORTHO TRI-CYCLEN LO) 0.18/0.215/0.25 MG-25 MCG tab Take 1 tablet by mouth daily. 3 Package 4   No facility-administered medications prior to visit.     No Known Allergies  Review of Systems  Constitutional: Negative for fever.  Eyes: Negative for blurred vision.  Respiratory: Negative for cough and shortness of breath.   Cardiovascular: Negative for chest pain and palpitations.  Gastrointestinal: Negative for vomiting.  Musculoskeletal: Positive for joint pain. Negative for back pain.  Skin: Negative for rash.  Neurological: Negative for loss of consciousness and headaches.       Objective:    Physical Exam  Constitutional: She is oriented to person, place, and time. She appears well-developed and well-nourished. No distress.  HENT:  Head: Normocephalic and atraumatic.  Eyes: Conjunctivae are normal.  Neck: Normal range of motion. No thyromegaly present.  Cardiovascular: Normal rate and regular rhythm.   Pulmonary/Chest:  Effort normal and breath sounds normal. She has no wheezes.  Abdominal: Soft. Bowel sounds are normal. There is no tenderness.  Musculoskeletal: Normal range of motion. She exhibits no edema or deformity.  Neurological: She is alert and oriented to person, place, and time.  Skin: Skin is warm and dry. She is not diaphoretic.  Psychiatric: She has a normal mood and affect.    LMP 04/22/2016  Wt Readings from Last 3 Encounters:  04/29/16 146 lb (66.2 kg)  04/23/15 145 lb (65.8 kg)  12/09/14 148 lb 4 oz (67.2 kg)     Lab Results  Component Value Date   WBC 9.1 05/17/2016   HGB 13.7 05/17/2016   HCT 40.8 05/17/2016   PLT 302.0 05/17/2016   GLUCOSE 76 05/17/2016   CHOL 199 05/17/2016   TRIG 141.0 05/17/2016   HDL 63.20 05/17/2016   LDLCALC 108 (H) 05/17/2016   ALT 14 05/17/2016   AST 14 05/17/2016   NA 140 05/17/2016   K 4.4 05/17/2016   CL 105 05/17/2016   CREATININE 0.70 05/17/2016   BUN 14 05/17/2016   CO2 28 05/17/2016   TSH 0.65 05/17/2016    Lab Results  Component Value Date   TSH 0.65 05/17/2016   Lab Results  Component Value Date   WBC 9.1 05/17/2016   HGB 13.7 05/17/2016   HCT 40.8 05/17/2016   MCV 90.8 05/17/2016   PLT 302.0 05/17/2016   Lab Results  Component Value Date   NA 140 05/17/2016   K 4.4 05/17/2016   CO2 28 05/17/2016   GLUCOSE 76 05/17/2016   BUN 14 05/17/2016   CREATININE 0.70 05/17/2016   BILITOT 0.5 05/17/2016   ALKPHOS 65 05/17/2016   AST 14 05/17/2016   ALT 14 05/17/2016   PROT 7.1 05/17/2016   ALBUMIN 4.1 05/17/2016   CALCIUM 9.6 05/17/2016   GFR 93.99 05/17/2016   Lab Results  Component Value Date   CHOL 199 05/17/2016   Lab Results  Component Value Date   HDL 63.20 05/17/2016   Lab Results  Component Value Date   LDLCALC 108 (H) 05/17/2016   Lab Results  Component Value Date   TRIG 141.0 05/17/2016   Lab Results  Component Value Date   CHOLHDL 3 05/17/2016   No results found for: HGBA1C     Assessment &  Plan:   Problem List Items Addressed This Visit    Hyperlipidemia    Encouraged heart healthy diet, increase exercise, avoid trans fats, consider a krill oil cap daily      Relevant Orders  Lipid panel (Completed)   Preventative health care    Patient encouraged to maintain heart healthy diet, regular exercise, adequate sleep. Consider daily probiotics. Take medications as prescribed. cologuard ordered today. Flu shot given. Dr Hollice Espy did gyn exam      Relevant Orders   TSH (Completed)   CBC (Completed)   Comprehensive metabolic panel (Completed)   Lateral epicondylitis of right elbow - Primary    Try ice, topical treatments and bracing if no improvement. Referred to orthopaedics surgeon.       Relevant Orders   Ambulatory referral to Orthopedic Surgery    Other Visit Diagnoses    Encounter for immunization       Relevant Orders   Flu Vaccine QUAD 36+ mos IM (Completed)   Ambulatory referral to Orthopedic Surgery      I am having Ms. Anderberg maintain her multivitamin, NON FORMULARY, meloxicam, GARLIC PO, and Norgestimate-Ethinyl Estradiol Triphasic.  No orders of the defined types were placed in this encounter.    Penni Homans, MD

## 2016-05-17 NOTE — Assessment & Plan Note (Addendum)
Patient encouraged to maintain heart healthy diet, regular exercise, adequate sleep. Consider daily probiotics. Take medications as prescribed. cologuard ordered today. Flu shot given. Dr Hollice Espy did gyn exam

## 2016-05-17 NOTE — Progress Notes (Signed)
Pre visit review using our clinic review tool, if applicable. No additional management support is needed unless otherwise documented below in the visit note. 

## 2016-05-18 ENCOUNTER — Telehealth: Payer: Self-pay | Admitting: Family Medicine

## 2016-05-18 LAB — COMPREHENSIVE METABOLIC PANEL
ALBUMIN: 4.1 g/dL (ref 3.5–5.2)
ALT: 14 U/L (ref 0–35)
AST: 14 U/L (ref 0–37)
Alkaline Phosphatase: 65 U/L (ref 39–117)
BUN: 14 mg/dL (ref 6–23)
CHLORIDE: 105 meq/L (ref 96–112)
CO2: 28 meq/L (ref 19–32)
CREATININE: 0.7 mg/dL (ref 0.40–1.20)
Calcium: 9.6 mg/dL (ref 8.4–10.5)
GFR: 93.99 mL/min (ref >60.00)
GLUCOSE: 76 mg/dL (ref 70–99)
POTASSIUM: 4.4 meq/L (ref 3.5–5.1)
SODIUM: 140 meq/L (ref 135–145)
Total Bilirubin: 0.5 mg/dL (ref 0.2–1.2)
Total Protein: 7.1 g/dL (ref 6.0–8.3)

## 2016-05-18 LAB — CBC
HEMATOCRIT: 40.8 % (ref 36.0–46.0)
HEMOGLOBIN: 13.7 g/dL (ref 12.0–15.0)
MCHC: 33.5 g/dL (ref 30.0–36.0)
MCV: 90.8 fl (ref 78.0–100.0)
Platelets: 302 10*3/uL (ref 150.0–400.0)
RBC: 4.49 Mil/uL (ref 3.87–5.11)
RDW: 12.8 % (ref 11.5–15.5)
WBC: 9.1 10*3/uL (ref 4.0–10.5)

## 2016-05-18 LAB — LIPID PANEL
CHOL/HDL RATIO: 3
CHOLESTEROL: 199 mg/dL (ref 0–200)
HDL: 63.2 mg/dL (ref >39.00)
LDL CALC: 108 mg/dL — AB (ref 0–99)
NONHDL: 135.98
Triglycerides: 141 mg/dL (ref 0.0–149.0)
VLDL: 28.2 mg/dL (ref 0.0–40.0)

## 2016-05-18 LAB — TSH: TSH: 0.65 u[IU]/mL (ref 0.35–4.50)

## 2016-05-18 NOTE — Telephone Encounter (Signed)
Pt dropped off Letter for provider to have it rewritten (letter for workers comp case) and to call pt when letter is ready (tel [phone] or emailed to [email or web address]). Letter put at front office tray.

## 2016-05-22 NOTE — Assessment & Plan Note (Addendum)
Try ice, topical treatments and bracing if no improvement. Referred to orthopaedics surgeon.

## 2016-05-26 NOTE — Telephone Encounter (Signed)
Patient called to follow up on letter needed. States she needs this letter before she goes to the Orthopedic doctor. Plse adv

## 2016-05-27 ENCOUNTER — Encounter: Payer: Self-pay | Admitting: Family Medicine

## 2016-05-27 NOTE — Telephone Encounter (Signed)
Have not seen anything new for this patient? Print any old letters in the system so I can review

## 2016-05-27 NOTE — Telephone Encounter (Signed)
Spoke to the patient and this is in regards to letter written at her last appt. 0n 05/17/16 that needs changes. Needs to change as follows. 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  Needs to be done asap as she has not filed her claim yet and seeing Dr. Noemi Chapel letter must be attached to the CO2 form before going/taking with her to his appt.

## 2016-05-29 NOTE — Telephone Encounter (Signed)
See paper note to write up the letter please

## 2016-05-30 ENCOUNTER — Encounter: Payer: Self-pay | Admitting: Family Medicine

## 2016-05-30 NOTE — Telephone Encounter (Signed)
Completed/corrected/printed letter as PCP instructed /patient requested. PCP reviewed the letter signed and mailed to the patients home. Called the patient informed letter completed and she requested it be mailed to her.

## 2016-06-08 ENCOUNTER — Other Ambulatory Visit: Payer: Self-pay | Admitting: Certified Nurse Midwife

## 2016-06-08 DIAGNOSIS — Z3041 Encounter for surveillance of contraceptive pills: Secondary | ICD-10-CM

## 2016-06-08 NOTE — Telephone Encounter (Signed)
Spoke with pharmacy, they do have the prescription on file that was sent on 04/30/16. Per pharmacist, patient received 3 month supply of OCP's in October. Patient is not able to pick up any more until December 10th.   Spoke with patient and advised her of this. Patient stated she did not realize and will call pharmacy when ready for more OCP's.

## 2016-06-08 NOTE — Telephone Encounter (Signed)
Patient request a refill of her birthcontrol. Confirmed pharmacy on file.

## 2016-06-15 ENCOUNTER — Other Ambulatory Visit: Payer: Self-pay | Admitting: Certified Nurse Midwife

## 2016-06-15 DIAGNOSIS — Z309 Encounter for contraceptive management, unspecified: Secondary | ICD-10-CM

## 2016-12-15 ENCOUNTER — Telehealth: Payer: Self-pay | Admitting: Family Medicine

## 2016-12-15 NOTE — Telephone Encounter (Signed)
°  Relation to OL:MBEM Call back number:[phone]   Reason for call:  Patient last seen 05/17/17 for her physical appointment, patient inquiring about colguard and would like to know the process, please advise

## 2016-12-20 NOTE — Telephone Encounter (Signed)
Called her back and informed of cologuard info.   PCP had encouraged her to do at her last OV Completed cologuard form/ put at the front desk for her to sign and then left note on form to return to me then will fax to cologuard.

## 2016-12-20 NOTE — Telephone Encounter (Signed)
Kaylan offered to order on line for this patient that way she does not have to come to the office. Patient notified.

## 2016-12-20 NOTE — Telephone Encounter (Signed)
Cologuard ordered through [employer] portal. Order number: [id number]. Order confirmation sent for scanning.

## 2017-01-12 LAB — HM MAMMOGRAPHY

## 2017-01-17 ENCOUNTER — Encounter: Payer: Self-pay | Admitting: Family Medicine

## 2017-01-28 LAB — COLOGUARD: COLOGUARD: NEGATIVE

## 2017-05-05 ENCOUNTER — Ambulatory Visit (INDEPENDENT_AMBULATORY_CARE_PROVIDER_SITE_OTHER): Payer: 59 | Admitting: Certified Nurse Midwife

## 2017-05-05 ENCOUNTER — Encounter: Payer: Self-pay | Admitting: Certified Nurse Midwife

## 2017-05-05 VITALS — BP 110/62 | HR 68 | Resp 16 | Ht 65.5 in | Wt 150.0 lb

## 2017-05-05 DIAGNOSIS — Z01419 Encounter for gynecological examination (general) (routine) without abnormal findings: Secondary | ICD-10-CM

## 2017-05-05 DIAGNOSIS — Z3041 Encounter for surveillance of contraceptive pills: Secondary | ICD-10-CM | POA: Diagnosis not present

## 2017-05-05 NOTE — Patient Instructions (Signed)

## 2017-05-05 NOTE — Progress Notes (Signed)
51 y.o. G0P0000 Married  Caucasian Fe here for annual exam. Periods normal, no issues. Contraception OCP working well and would like to continue. Sees PCP for aex/labs and has been watching cholesterol, but normal range. Patient works on diet and exercise. No health changes. No other health issues today. Taking beach trip soon.  Patient's last menstrual period was 04/21/2017 (exact date).          Sexually active: Yes.    The current method of family planning is OCP (estrogen/progesterone).    Exercising: Yes.    walking Smoker:  no  Health Maintenance: Pap:  03-25-14 neg, 04-29-16 neg HPV HR neg History of Abnormal Pap: no MMG:  01-12-17  Category b density birads 1:neg Self Breast exams: no Colonoscopy: only cologuard done BMD:   none TDaP:  2013 Shingles: no Pneumonia: no Hep C and HIV: not done Labs: yes   reports that she has never smoked. She has never used smokeless tobacco. She reports that she does not drink alcohol or use drugs.  Past Medical History:  Diagnosis Date  . Benign paroxysmal positional vertigo 08/25/2014  . Cerumen impaction 06/22/2012  . Dysmenorrhea   . Fibroid   . Hyperlipidemia   . Menorrhagia   . Pain of right heel 07/05/2013  . Plantar fasciitis of right foot 07/05/2013  . Preventative health care 08/22/2011  . Tinea pedis 12/02/2011    Past Surgical History:  Procedure Laterality Date  . 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 fibroidectomy    . hysteroscopic resection  5/04    Current Outpatient Prescriptions  Medication Sig Dispense Refill  . meloxicam (MOBIC) 15 MG tablet Take 15 mg by mouth as needed.  12  . Multiple Vitamin (MULTIVITAMIN) tablet Take 1 tablet by mouth as needed.     . NON FORMULARY daily. Omega red     . Norgestimate-Ethinyl Estradiol Triphasic (ORTHO TRI-CYCLEN LO) 0.18/0.215/0.25 MG-25 MCG tab Take 1 tablet by mouth daily. 3 Package 4   No current facility-administered medications for  this visit.     Family History  Problem Relation Age of Onset  . Diabetes Mother        type 2  . Heart attack Mother 6  . Hypertension Mother   . Hyperlipidemia Mother   . Stroke Mother        ministrokes  . Dementia Mother   . Heart disease Mother        MI 2005, s/p 3 stents  . Congestive Heart Failure Mother   . Kidney disease Mother   . Heart attack Maternal Grandmother 67  . Diabetes Maternal Grandmother        type 2  . Hypertension Maternal Grandmother   . Hyperlipidemia Maternal Grandmother   . Stroke Maternal Grandfather   . Heart attack Paternal Grandfather   . Heart disease Paternal Grandfather        MI?  Marland Kitchen Hyperlipidemia Sister   . Diabetes Brother        type 2  . Alcohol abuse Brother   . Mental illness Brother   . Hyperlipidemia Brother   . Heart disease Brother        w/CAD s/p 1 stent  . Diabetes Brother        type 2    ROS:  Pertinent items are noted in HPI.  Otherwise, a comprehensive ROS was negative.  Exam:   BP 110/62   Pulse 68  Resp 16   Ht 5' 5.5" (1.664 m)   Wt 150 lb (68 kg)   LMP 04/21/2017 (Exact Date)   BMI 24.58 kg/m  Height: 5' 5.5" (166.4 cm) Ht Readings from Last 3 Encounters:  05/05/17 5' 5.5" (1.664 m)  04/29/16 5' 5.5" (1.664 m)  04/23/15 5' 5.75" (1.67 m)    General appearance: alert, cooperative and appears stated age Head: Normocephalic, without obvious abnormality, atraumatic Neck: no adenopathy, supple, symmetrical, trachea midline and thyroid normal to inspection and palpation Lungs: clear to auscultation bilaterally Breasts: normal appearance, no masses or tenderness, No nipple retraction or dimpling, No nipple discharge or bleeding, No axillary or supraclavicular adenopathy Heart: regular rate and rhythm Abdomen: soft, non-tender; no masses,  no organomegaly Extremities: extremities normal, atraumatic, no cyanosis or edema Skin: Skin color, texture, turgor normal. No rashes or lesions Lymph nodes:  Cervical, supraclavicular, and axillary nodes normal. No abnormal inguinal nodes palpated Neurologic: Grossly normal   Pelvic: External genitalia:  no lesions              Urethra:  normal appearing urethra with no masses, tenderness or lesions              Bartholin's and Skene's: normal                 Vagina: normal appearing vagina with normal color and discharge, no lesions              Cervix: no cervical motion tenderness, no lesions and nulliparous appearance              Pap taken: No. Bimanual Exam:  Uterus:  normal size, contour, position, consistency, mobility, non-tender              Adnexa: normal adnexa and no mass, fullness, tenderness               Rectovaginal: Confirms               Anus:  normal sphincter tone, no lesions  Chaperone present: yes  A:  Well Woman with normal exam  Contraception OCP working well would like to continue  MD management with Aex/labs    P:   Reviewed health and wellness pertinent to exam  Discussed need to reduce estrogen level with OCP and be off by age 42. Patient agreeable.  Rx Lo loestrin  See order with instructions.  Continue to follow up with PCP as indicated  Pap smear: yes   counseled on breast self exam, mammography screening, feminine hygiene, adequate intake of calcium and vitamin D, diet and exercise  return annually or prn  An After Visit Summary was printed and given to the patient.

## 2017-05-18 ENCOUNTER — Other Ambulatory Visit: Payer: Self-pay | Admitting: Certified Nurse Midwife

## 2017-05-18 DIAGNOSIS — Z3041 Encounter for surveillance of contraceptive pills: Secondary | ICD-10-CM

## 2017-05-18 NOTE — Telephone Encounter (Signed)
Medication refill request: OCP  Last AEX:  05-05-17  Next AEX: 05-15-18  Last MMG (if hormonal medication request): 01-12-17 WNL  Refill authorized: please advise

## 2017-05-19 ENCOUNTER — Encounter: Payer: 59 | Admitting: Family Medicine

## 2017-05-22 ENCOUNTER — Other Ambulatory Visit: Payer: Self-pay | Admitting: Certified Nurse Midwife

## 2017-05-22 ENCOUNTER — Encounter: Payer: 59 | Admitting: Family Medicine

## 2017-05-22 ENCOUNTER — Other Ambulatory Visit: Payer: Self-pay | Admitting: Family Medicine

## 2017-05-22 DIAGNOSIS — Z3041 Encounter for surveillance of contraceptive pills: Secondary | ICD-10-CM

## 2017-05-22 NOTE — Telephone Encounter (Signed)
Medication refill request: OCP  Last AEX:  05-05-17  Next AEX: 05-15-18  Last MMG (if hormonal medication request): 01-12-17 WNL  Refill authorized: please advise

## 2017-05-22 NOTE — Telephone Encounter (Signed)
Patient said she was seen recently and her birth control prescription was not called to the pharmacy on file.

## 2017-05-23 ENCOUNTER — Other Ambulatory Visit: Payer: Self-pay | Admitting: Certified Nurse Midwife

## 2017-05-23 DIAGNOSIS — Z3041 Encounter for surveillance of contraceptive pills: Secondary | ICD-10-CM

## 2017-05-23 MED ORDER — NORETHIN-ETH ESTRAD-FE BIPHAS 1 MG-10 MCG / 10 MCG PO TABS: 1.0000 | ORAL_TABLET | Freq: Every day | ORAL | 4 refills | Status: DC

## 2017-05-23 NOTE — Telephone Encounter (Signed)
Patient changed to Lo loestrin due to age

## 2017-06-13 ENCOUNTER — Encounter: Payer: Self-pay | Admitting: Family Medicine

## 2017-06-16 ENCOUNTER — Ambulatory Visit (INDEPENDENT_AMBULATORY_CARE_PROVIDER_SITE_OTHER): Payer: 59 | Admitting: Family Medicine

## 2017-06-16 DIAGNOSIS — M79672 Pain in left foot: Secondary | ICD-10-CM | POA: Diagnosis not present

## 2017-06-16 MED ORDER — CEPHALEXIN 500 MG PO CAPS: 500.0000 mg | ORAL_CAPSULE | Freq: Four times a day (QID) | ORAL | 0 refills | Status: DC

## 2017-06-16 NOTE — Progress Notes (Signed)
Subjective:  I acted as a Education administrator for Dr. Charlett Blake. [place], [place]  Patient ID: Jasmin Taylor, female    DOB: [birth year], 51 y.o.   MRN: [id number]  No chief complaint on file.   HPI  Patient is in today for an acute visit for left foot pain. She states she has had this pain on the bottom of her left foot for a few days, she denies any injury or falls. No recent febrile illness or acute hospitalizations. Denies CP/palp/SOB/HA/congestion/fevers/GI or GU c/o. Taking meds as prescribed. Denies any injury or stepping on anything.    Patient Care Team: Mosie Lukes, MD as PCP - General (Family Medicine) Regina Eck, CNM as Consulting Physician (Certified Nurse Midwife)   Past Medical History:  Diagnosis Date  . Benign paroxysmal positional vertigo 08/25/2014  . Cerumen impaction 06/22/2012  . Dysmenorrhea   . Fibroid   . Hyperlipidemia   . Menorrhagia   . Pain of right heel 07/05/2013  . Plantar fasciitis of right foot 07/05/2013  . Preventative health care 08/22/2011  . Tinea pedis 12/02/2011    Past Surgical History:  Procedure Laterality Date  . 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 fibroidectomy    . hysteroscopic resection  5/04    Family History  Problem Relation Age of Onset  . Diabetes Mother        type 2  . Heart attack Mother 35  . Hypertension Mother   . Hyperlipidemia Mother   . Stroke Mother        ministrokes  . Dementia Mother   . Heart disease Mother        MI 2005, s/p 3 stents  . Congestive Heart Failure Mother   . Kidney disease Mother   . Heart attack Maternal Grandmother 67  . Diabetes Maternal Grandmother        type 2  . Hypertension Maternal Grandmother   . Hyperlipidemia Maternal Grandmother   . Stroke Maternal Grandfather   . Heart attack Paternal Grandfather   . Heart disease Paternal Grandfather        MI?  Marland Kitchen Hyperlipidemia Sister   . Diabetes Brother        type 2  . Alcohol abuse  Brother   . Mental illness Brother   . Hyperlipidemia Brother   . Heart disease Brother        w/CAD s/p 1 stent  . Diabetes Brother        type 2    Social History   Socioeconomic History  . Marital status: Married    Spouse name: Not on file  . Number of children: Not on file  . Years of education: Not on file  . Highest education level: Not on file  Social Needs  . Financial resource strain: Not on file  . Food insecurity - worry: Not on file  . Food insecurity - inability: Not on file  . Transportation needs - medical: Not on file  . Transportation needs - non-medical: Not on file  Occupational History  . Not on file  Tobacco Use  . Smoking status: Never Smoker  . Smokeless tobacco: Never Used  Substance and Sexual Activity  . Alcohol use: No  . Drug use: No  . Sexual activity: Yes    Partners: Male    Birth control/protection: Pill  Other Topics Concern  . Not on file  Social History Narrative   No  major dietary restrictions   Walks the dogs, 2    Works for the [employer]      Lives with husband    Outpatient Medications Prior to Visit  Medication Sig Dispense Refill  . meloxicam (MOBIC) 15 MG tablet Take 15 mg by mouth as needed.  12  . Multiple Vitamin (MULTIVITAMIN) tablet Take 1 tablet by mouth as needed.     . NON FORMULARY daily. Omega red     . Norethindrone-Ethinyl Estradiol-Fe Biphas (LO LOESTRIN FE) 1 MG-10 MCG / 10 MCG tablet Take 1 tablet daily by mouth. 3 Package 4  . TRI-LO-MARZIA 0.18/0.215/0.25 MG-25 MCG tab TAKE 1 TABLET BY MOUTH DAILY. 84 tablet 3   No facility-administered medications prior to visit.     No Known Allergies  Review of Systems  Constitutional: Negative for fever and malaise/fatigue.  HENT: Negative for congestion.   Eyes: Negative for blurred vision.  Respiratory: Negative for cough and shortness of breath.   Cardiovascular: Negative for chest pain, palpitations and leg swelling.  Gastrointestinal: Negative  for vomiting.  Musculoskeletal: Positive for joint pain. Negative for back pain.  Skin: Negative for rash.  Neurological: Negative for loss of consciousness and headaches.       Objective:    Physical Exam  Constitutional: She is oriented to person, place, and time. She appears well-developed and well-nourished. No distress.  HENT:  Head: Normocephalic and atraumatic.  Nose: Nose normal.  Eyes: Conjunctivae are normal. Right eye exhibits no discharge. Left eye exhibits no discharge.  Neck: Normal range of motion. Neck supple. No thyromegaly present.  Cardiovascular: Normal rate and regular rhythm.  No murmur heard. Pulmonary/Chest: Effort normal and breath sounds normal. She has no wheezes.  Abdominal: Soft. Bowel sounds are normal. There is no tenderness.  Musculoskeletal: Normal range of motion. She exhibits no edema or deformity.  Neurological: She is alert and oriented to person, place, and time.  Skin: Skin is warm and dry. She is not diaphoretic.  5 mm oval shaped erythematlous slightly scaly lesion with clearing around lesion  Psychiatric: She has a normal mood and affect. Her behavior is normal.  Nursing note and vitals reviewed.   BP 116/76 (BP Location: Left Arm, Patient Position: Sitting, Cuff Size: Normal)   Pulse 75   Temp 98 F (36.7 C) (Oral)   Resp 18   Wt 148 lb 9.6 oz (67.4 kg)   SpO2 95%   BMI 24.35 kg/m  Wt Readings from Last 3 Encounters:  06/16/17 148 lb 9.6 oz (67.4 kg)  05/05/17 150 lb (68 kg)  04/29/16 146 lb (66.2 kg)   BP Readings from Last 3 Encounters:  06/16/17 116/76  05/05/17 110/62  04/29/16 112/70     Immunization History  Administered Date(s) Administered  . Influenza Split 04/10/2012  . Influenza Whole 05/12/2011, 05/11/2013  . Influenza,inj,Quad PF,6+ Mos 05/17/2016  . Tdap 08/19/2011    Health Maintenance  Topic Date Due  . HIV Screening  12/25/1980  . INFLUENZA VACCINE  02/08/2017  . MAMMOGRAM  01/12/2018  . PAP SMEAR   04/30/2019  . Fecal DNA (Cologuard)  01/24/2020  . TETANUS/TDAP  08/18/2021    Lab Results  Component Value Date   WBC 9.1 05/17/2016   HGB 13.7 05/17/2016   HCT 40.8 05/17/2016   PLT 302.0 05/17/2016   GLUCOSE 76 05/17/2016   CHOL 199 05/17/2016   TRIG 141.0 05/17/2016   HDL 63.20 05/17/2016   LDLCALC 108 (H) 05/17/2016  ALT 14 05/17/2016   AST 14 05/17/2016   NA 140 05/17/2016   K 4.4 05/17/2016   CL 105 05/17/2016   CREATININE 0.70 05/17/2016   BUN 14 05/17/2016   CO2 28 05/17/2016   TSH 0.65 05/17/2016    Lab Results  Component Value Date   TSH 0.65 05/17/2016   Lab Results  Component Value Date   WBC 9.1 05/17/2016   HGB 13.7 05/17/2016   HCT 40.8 05/17/2016   MCV 90.8 05/17/2016   PLT 302.0 05/17/2016   Lab Results  Component Value Date   NA 140 05/17/2016   K 4.4 05/17/2016   CO2 28 05/17/2016   GLUCOSE 76 05/17/2016   BUN 14 05/17/2016   CREATININE 0.70 05/17/2016   BILITOT 0.5 05/17/2016   ALKPHOS 65 05/17/2016   AST 14 05/17/2016   ALT 14 05/17/2016   PROT 7.1 05/17/2016   ALBUMIN 4.1 05/17/2016   CALCIUM 9.6 05/17/2016   GFR 93.99 05/17/2016   Lab Results  Component Value Date   CHOL 199 05/17/2016   Lab Results  Component Value Date   HDL 63.20 05/17/2016   Lab Results  Component Value Date   LDLCALC 108 (H) 05/17/2016   Lab Results  Component Value Date   TRIG 141.0 05/17/2016   Lab Results  Component Value Date   CHOLHDL 3 05/17/2016   No results found for: HGBA1C       Assessment & Plan:   Problem List Items Addressed This Visit    Foot pain, left    Has been escalating for about a week. It is OK when not weightbearing . Likely a plantar wart will refer to podiatry for care. Unlikely secondary infection but will give rx for antibiotic incase it worsens and starts to drain.       Relevant Orders   Ambulatory referral to Podiatry      I am having Curt Bears L. Pong start on cephALEXin. I am also having her  maintain her multivitamin, NON FORMULARY, meloxicam, TRI-LO-MARZIA, and Norethindrone-Ethinyl Estradiol-Fe Biphas.  Meds ordered this encounter  Medications  . cephALEXin (KEFLEX) 500 MG capsule    Sig: Take 1 capsule (500 mg total) by mouth 4 (four) times daily.    Dispense:  21 capsule    Refill:  0    CMA served as scribe during this visit. History, Physical and Plan performed by medical provider. Documentation and orders reviewed and attested to.  Penni Homans, MD

## 2017-06-16 NOTE — Patient Instructions (Signed)
Plantar Warts Warts are small growths on the skin. They can occur on various areas of the body. When they occur on the underside (sole) of the foot, they are called plantar warts. Plantar warts often occur in groups, with several small warts around a larger growth. They tend to develop over areas of pressure, such as the heel or the ball of the foot. Most warts are not painful, and they usually do not cause problems. However, plantar warts may cause pain when you walk because pressure is applied to them. Warts often go away on their own in time. Various treatments may be done if needed. Sometimes, warts go away and then they come back again. What are the causes? Plantar warts are caused by a type of virus that is called human papillomavirus (HPV). HPV attacks a break in the skin of the foot. Walking barefoot can lead to exposure to the virus. These warts may spread to other areas of the sole. They spread to other areas of the body only through direct contact. What increases the risk? Plantar warts are more likely to develop in:  People who are 10-20 years of age.  People who use public showers or locker rooms.  People who have a weakened body defense system (immune system).  What are the signs or symptoms? Plantar warts may be flat or slightly raised. They may grow into the deeper layers of skin or rise above the surface of the skin. Most plantar warts have a rough surface. They may cause pain when you use your foot to support your body weight. How is this diagnosed? A plantar wart can usually be diagnosed from its appearance. In some cases, a tissue sample may be removed (biopsy) to be looked at under a microscope. How is this treated? In many cases, warts do not need treatment. Without treatment, they often go away over a period of many months to a couple years. If treatment is needed, options may include:  Applying medicated solutions, creams, or patches to the wart. These may be  over-the-counter or prescription medicines that make the skin soft so that layers will gradually shed away. In many cases, the medicine is applied one or two times per day and covered with a bandage.  Putting duct tape over the top of the wart (occlusion). You will leave the tape in place for as long as told by your health care provider, then you will replace it with a new strip of tape. This is done until the wart goes away.  Freezing the wart with liquid nitrogen (cryotherapy).  Burning the wart with: ? Laser treatment. ? An electrified probe (electrocautery).  Injection of a medicine (Candida antigen) into the wart to help the body's immune system to fight off the wart.  Surgery to remove the wart.  Follow these instructions at home:  Apply medicated creams or solutions only as told by your health care provider. This may involve: ? Soaking the affected area in warm water. ? Removing the top layer of softened skin before you apply the medicine. A pumice stone works well for removing the tissue. ? Applying a bandage over the affected area after you apply the medicine. ? Repeating the process daily or as told by your health care provider.  Do not scratch or pick at a wart.  Wash your hands after you touch a wart.  If a wart is painful, try applying a bandage with a hole in the middle over the wart. The helps to take   pressure off the wart.  Keep all follow-up visits as told by your health care provider. This is important. How is this prevented? Take these actions to help prevent warts:  Wear shoes and socks. Change your socks daily.  Keep your feet clean and dry.  Check your feet regularly.  Avoid direct contact with warts on other people.  Contact a health care provider if:  Your warts do not improve after treatment.  You have redness, swelling, or pain at the site of a wart.  You have bleeding from a wart that does not stop with light pressure.  You have diabetes and  you develop a wart. This information is not intended to replace advice given to you by your health care provider. Make sure you discuss any questions you have with your health care provider. Document Released: 09/17/2003 Document Revised: 12/03/2015 Document Reviewed: 09/22/2014 Elsevier Interactive Patient Education  2018 Elsevier Inc.  

## 2017-06-16 NOTE — Assessment & Plan Note (Signed)
Has been escalating for about a week. It is OK when not weightbearing . Likely a plantar wart will refer to podiatry for care. Unlikely secondary infection but will give rx for antibiotic incase it worsens and starts to drain.

## 2017-07-24 ENCOUNTER — Ambulatory Visit (INDEPENDENT_AMBULATORY_CARE_PROVIDER_SITE_OTHER): Payer: 59 | Admitting: Family Medicine

## 2017-07-24 ENCOUNTER — Encounter: Payer: Self-pay | Admitting: Family Medicine

## 2017-07-24 DIAGNOSIS — Z0000 Encounter for general adult medical examination without abnormal findings: Secondary | ICD-10-CM

## 2017-07-24 DIAGNOSIS — M79672 Pain in left foot: Secondary | ICD-10-CM | POA: Diagnosis not present

## 2017-07-24 DIAGNOSIS — E782 Mixed hyperlipidemia: Secondary | ICD-10-CM

## 2017-07-24 NOTE — Progress Notes (Signed)
Subjective:  I acted as a Education administrator for [employer]. Yancey Flemings, [place]   Patient ID: Jasmin Taylor, female    DOB: 28-Mar-1966, 52 y.o.   MRN: [id number]  Chief Complaint  Patient presents with  . Annual Exam    HPI  Patient is in today for annual exam and she reports feeling well. No recent febrile illness or acute hospitalizations. She has been trying to stay active and eat a heart healthy diet. She is doing well with her activities of daily living. Denies CP/palp/SOB/HA/congestion/fevers/GI or GU c/o. Taking meds as prescribed  Patient Care Team: Mosie Lukes, MD as PCP - General (Family Medicine) Regina Eck, CNM as Consulting Physician (Certified Nurse Midwife)   Past Medical History:  Diagnosis Date  . Benign paroxysmal positional vertigo 08/25/2014  . Cerumen impaction 06/22/2012  . Dysmenorrhea   . Fibroid   . Hyperlipidemia   . Menorrhagia   . Pain of right heel 07/05/2013  . Plantar fasciitis of right foot 07/05/2013  . Preventative health care 08/22/2011  . Tinea pedis 12/02/2011    Past Surgical History:  Procedure Laterality Date  . 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 fibroidectomy    . hysteroscopic resection  5/04    Family History  Problem Relation Age of Onset  . Diabetes Mother        type 2  . Heart attack Mother 8  . Hypertension Mother   . Hyperlipidemia Mother   . Stroke Mother        ministrokes  . Dementia Mother   . Heart disease Mother        MI 2005, s/p 3 stents  . Congestive Heart Failure Mother   . Kidney disease Mother   . Heart attack Maternal Grandmother 67  . Diabetes Maternal Grandmother        type 2  . Hypertension Maternal Grandmother   . Hyperlipidemia Maternal Grandmother   . Stroke Maternal Grandfather   . Heart attack Paternal Grandfather   . Heart disease Paternal Grandfather        MI?  Marland Kitchen Hyperlipidemia Sister   . Diabetes Brother        type 2  . Pancreatitis Brother     . Alcohol abuse Brother   . Mental illness Brother   . Hyperlipidemia Brother   . Heart disease Brother        w/CAD s/p 1 stent  . Diabetes Brother        type 2  . Heart disease Father   . Pancreatitis Sister     Social History   Socioeconomic History  . Marital status: Married    Spouse name: Not on file  . Number of children: Not on file  . Years of education: Not on file  . Highest education level: Not on file  Social Needs  . Financial resource strain: Not on file  . Food insecurity - worry: Not on file  . Food insecurity - inability: Not on file  . Transportation needs - medical: Not on file  . Transportation needs - non-medical: Not on file  Occupational History  . Not on file  Tobacco Use  . Smoking status: Never Smoker  . Smokeless tobacco: Never Used  Substance and Sexual Activity  . Alcohol use: No  . Drug use: No  . Sexual activity: Yes    Partners: Male    Birth control/protection: Pill  Other Topics  Concern  . Not on file  Social History Narrative   No major dietary restrictions   Walks the dogs, 2    Works for the [employer]      Lives with husband    Outpatient Medications Prior to Visit  Medication Sig Dispense Refill  . cephALEXin (KEFLEX) 500 MG capsule Take 1 capsule (500 mg total) by mouth 4 (four) times daily. 21 capsule 0  . meloxicam (MOBIC) 15 MG tablet Take 15 mg by mouth as needed.  12  . Multiple Vitamin (MULTIVITAMIN) tablet Take 1 tablet by mouth as needed.     . NON FORMULARY daily. Omega red     . Norethindrone-Ethinyl Estradiol-Fe Biphas (LO LOESTRIN FE) 1 MG-10 MCG / 10 MCG tablet Take 1 tablet daily by mouth. 3 Package 4  . TRI-LO-MARZIA 0.18/0.215/0.25 MG-25 MCG tab TAKE 1 TABLET BY MOUTH DAILY. 84 tablet 3   No facility-administered medications prior to visit.     No Known Allergies  Review of Systems  Constitutional: Negative for fever and malaise/fatigue.  HENT: Negative for congestion.   Eyes: Negative  for blurred vision.  Respiratory: Negative for shortness of breath.   Cardiovascular: Negative for chest pain, palpitations and leg swelling.  Gastrointestinal: Negative for abdominal pain, blood in stool and nausea.  Genitourinary: Negative for dysuria and frequency.  Musculoskeletal: Negative for falls.  Skin: Negative for rash.  Neurological: Negative for dizziness, loss of consciousness and headaches.  Endo/Heme/Allergies: Negative for environmental allergies.  Psychiatric/Behavioral: Negative for depression. The patient is not nervous/anxious.        Objective:    Physical Exam  Constitutional: She is oriented to person, place, and time. She appears well-developed and well-nourished. No distress.  HENT:  Head: Normocephalic and atraumatic.  Eyes: Conjunctivae are normal.  Neck: Neck supple. No thyromegaly present.  Cardiovascular: Normal rate, regular rhythm and normal heart sounds.  No murmur heard. Pulmonary/Chest: Effort normal and breath sounds normal. No respiratory distress.  Abdominal: Soft. Bowel sounds are normal. She exhibits no distension and no mass. There is no tenderness.  Musculoskeletal: She exhibits no edema.  Lymphadenopathy:    She has no cervical adenopathy.  Neurological: She is alert and oriented to person, place, and time.  Skin: Skin is warm and dry.  Psychiatric: She has a normal mood and affect. Her behavior is normal.    BP (!) 127/57 (BP Location: Left Arm, Patient Position: Sitting, Cuff Size: Normal)   Pulse 75   Temp 97.9 F (36.6 C) (Oral)   Resp 18   Ht 5' 5.35" (1.66 m)   Wt 148 lb 3.2 oz (67.2 kg)   SpO2 100%   BMI 24.40 kg/m  Wt Readings from Last 3 Encounters:  07/24/17 148 lb 3.2 oz (67.2 kg)  06/16/17 148 lb 9.6 oz (67.4 kg)  05/05/17 150 lb (68 kg)   BP Readings from Last 3 Encounters:  07/24/17 (!) 127/57  06/16/17 116/76  05/05/17 110/62     Immunization History  Administered Date(s) Administered  . Influenza  Split 04/10/2012  . Influenza Whole 05/12/2011, 05/11/2013  . Influenza,inj,Quad PF,6+ Mos 05/17/2016  . Tdap 08/19/2011    Health Maintenance  Topic Date Due  . HIV Screening  12/25/1980  . MAMMOGRAM  01/12/2018  . PAP SMEAR  04/30/2019  . Fecal DNA (Cologuard)  01/24/2020  . TETANUS/TDAP  08/18/2021  . INFLUENZA VACCINE  Completed    Lab Results  Component Value Date   WBC 11.2 (H)  07/24/2017   HGB 13.7 07/24/2017   HCT 41.5 07/24/2017   PLT 318.0 07/24/2017   GLUCOSE 68 (L) 07/24/2017   CHOL 197 07/24/2017   TRIG 154.0 (H) 07/24/2017   HDL 55.40 07/24/2017   LDLCALC 111 (H) 07/24/2017   ALT 16 07/24/2017   AST 14 07/24/2017   NA 139 07/24/2017   K 4.1 07/24/2017   CL 104 07/24/2017   CREATININE 0.64 07/24/2017   BUN 14 07/24/2017   CO2 27 07/24/2017   TSH 0.72 07/24/2017    Lab Results  Component Value Date   TSH 0.72 07/24/2017   Lab Results  Component Value Date   WBC 11.2 (H) 07/24/2017   HGB 13.7 07/24/2017   HCT 41.5 07/24/2017   MCV 93.9 07/24/2017   PLT 318.0 07/24/2017   Lab Results  Component Value Date   NA 139 07/24/2017   K 4.1 07/24/2017   CO2 27 07/24/2017   GLUCOSE 68 (L) 07/24/2017   BUN 14 07/24/2017   CREATININE 0.64 07/24/2017   BILITOT 0.5 07/24/2017   ALKPHOS 79 07/24/2017   AST 14 07/24/2017   ALT 16 07/24/2017   PROT 7.3 07/24/2017   ALBUMIN 4.2 07/24/2017   CALCIUM 9.3 07/24/2017   GFR 103.74 07/24/2017   Lab Results  Component Value Date   CHOL 197 07/24/2017   Lab Results  Component Value Date   HDL 55.40 07/24/2017   Lab Results  Component Value Date   LDLCALC 111 (H) 07/24/2017   Lab Results  Component Value Date   TRIG 154.0 (H) 07/24/2017   Lab Results  Component Value Date   CHOLHDL 4 07/24/2017   No results found for: HGBA1C       Assessment & Plan:   Problem List Items Addressed This Visit    Hyperlipidemia    Encouraged heart healthy diet, increase exercise, avoid trans fats, consider a  krill oil cap daily      Relevant Orders   Lipid panel (Completed)   Preventative health care    Patient encouraged to maintain heart healthy diet, regular exercise, adequate sleep. Consider daily probiotics. Take medications as prescribed. Sees Dr Hollice Espy of GYN will request last pap and MGM. Encouraged to consider the Shingrix.      Relevant Orders   CBC (Completed)   Comprehensive metabolic panel (Completed)   TSH (Completed)   RESOLVED: Foot pain, left    Resolved sponteously         I am having Ryn L. Cressy maintain her multivitamin, NON FORMULARY, meloxicam, TRI-LO-MARZIA, Norethindrone-Ethinyl Estradiol-Fe Biphas, and cephALEXin.  No orders of the defined types were placed in this encounter.   CMA served as Education administrator during this visit. History, Physical and Plan performed by medical provider. Documentation and orders reviewed and attested to.  Penni Homans, MD

## 2017-07-24 NOTE — Patient Instructions (Addendum)
[facility]    Shingrix is the new shingles, 2 shots over 2-6 months, call insurance and confirm coverage then call for nurse appt or go to pharmacy Preventive Care 40-64 Years, Female Preventive care refers to lifestyle choices and visits with your health care provider that can promote health and wellness. What does preventive care include?  A yearly physical exam. This is also called an annual well check.  Dental exams once or twice a year.  Routine eye exams. Ask your health care provider how often you should have your eyes checked.  Personal lifestyle choices, including: ? Daily care of your teeth and gums. ? Regular physical activity. ? Eating a healthy diet. ? Avoiding tobacco and drug use. ? Limiting alcohol use. ? Practicing safe sex. ? Taking low-dose aspirin daily starting at age 23. ? Taking vitamin and mineral supplements as recommended by your health care provider. What happens during an annual well check? The services and screenings done by your health care provider during your annual well check will depend on your age, overall health, lifestyle risk factors, and family history of disease. Counseling Your health care provider may ask you questions about your:  Alcohol use.  Tobacco use.  Drug use.  Emotional well-being.  Home and relationship well-being.  Sexual activity.  Eating habits.  Work and work Statistician.  Method of birth control.  Menstrual cycle.  Pregnancy history.  Screening You may have the following tests or measurements:  Height, weight, and BMI.  Blood pressure.  Lipid and cholesterol levels. These may be checked every 5 years, or more frequently if you are over 25 years old.  Skin check.  Lung cancer screening. You may have this screening every year starting at age 4 if you have a 30-pack-year history of smoking and currently smoke or have quit within the past 15 years.  Fecal occult blood test (FOBT) of the stool. You  may have this test every year starting at age 59.  Flexible sigmoidoscopy or colonoscopy. You may have a sigmoidoscopy every 5 years or a colonoscopy every 10 years starting at age 60.  Hepatitis C blood test.  Hepatitis B blood test.  Sexually transmitted disease (STD) testing.  Diabetes screening. This is done by checking your blood sugar (glucose) after you have not eaten for a while (fasting). You may have this done every 1-3 years.  Mammogram. This may be done every 1-2 years. Talk to your health care provider about when you should start having regular mammograms. This may depend on whether you have a family history of breast cancer.  BRCA-related cancer screening. This may be done if you have a family history of breast, ovarian, tubal, or peritoneal cancers.  Pelvic exam and Pap test. This may be done every 3 years starting at age 24. Starting at age 70, this may be done every 5 years if you have a Pap test in combination with an HPV test.  Bone density scan. This is done to screen for osteoporosis. You may have this scan if you are at high risk for osteoporosis.  Discuss your test results, treatment options, and if necessary, the need for more tests with your health care provider. Vaccines Your health care provider may recommend certain vaccines, such as:  Influenza vaccine. This is recommended every year.  Tetanus, diphtheria, and acellular pertussis (Tdap, Td) vaccine. You may need a Td booster every 10 years.  Varicella vaccine. You may need this if you have not been vaccinated.  Zoster vaccine. You may need this after age 86.  Measles, mumps, and rubella (MMR) vaccine. You may need at least one dose of MMR if you were born in 1957 or later. You may also need a second dose.  Pneumococcal 13-valent conjugate (PCV13) vaccine. You may need this if you have certain conditions and were not previously vaccinated.  Pneumococcal polysaccharide (PPSV23) vaccine. You may need one  or two doses if you smoke cigarettes or if you have certain conditions.  Meningococcal vaccine. You may need this if you have certain conditions.  Hepatitis A vaccine. You may need this if you have certain conditions or if you travel or work in places where you may be exposed to hepatitis A.  Hepatitis B vaccine. You may need this if you have certain conditions or if you travel or work in places where you may be exposed to hepatitis B.  Haemophilus influenzae type b (Hib) vaccine. You may need this if you have certain conditions.  Talk to your health care provider about which screenings and vaccines you need and how often you need them. This information is not intended to replace advice given to you by your health care provider. Make sure you discuss any questions you have with your health care provider. Document Released: 07/24/2015 Document Revised: 03/16/2016 Document Reviewed: 04/28/2015 Elsevier Interactive Patient Education  [employer].

## 2017-07-24 NOTE — Assessment & Plan Note (Signed)
Encouraged heart healthy diet, increase exercise, avoid trans fats, consider a krill oil cap daily 

## 2017-07-24 NOTE — Assessment & Plan Note (Signed)
Resolved sponteously

## 2017-07-24 NOTE — Assessment & Plan Note (Signed)
Patient encouraged to maintain heart healthy diet, regular exercise, adequate sleep. Consider daily probiotics. Take medications as prescribed. Sees Dr Hollice Espy of GYN will request last pap and MGM. Encouraged to consider the Shingrix.

## 2017-07-25 LAB — TSH: TSH: 0.72 u[IU]/mL (ref 0.35–4.50)

## 2017-07-25 LAB — LIPID PANEL
Cholesterol: 197 mg/dL (ref 0–200)
HDL: 55.4 mg/dL (ref >39.00)
LDL Cholesterol: 111 mg/dL — ABNORMAL HIGH (ref 0–99)
NonHDL: 141.69
TRIGLYCERIDES: 154 mg/dL — AB (ref 0.0–149.0)
Total CHOL/HDL Ratio: 4
VLDL: 30.8 mg/dL (ref 0.0–40.0)

## 2017-07-25 LAB — COMPREHENSIVE METABOLIC PANEL
ALT: 16 U/L (ref 0–35)
AST: 14 U/L (ref 0–37)
Albumin: 4.2 g/dL (ref 3.5–5.2)
Alkaline Phosphatase: 79 U/L (ref 39–117)
BILIRUBIN TOTAL: 0.5 mg/dL (ref 0.2–1.2)
BUN: 14 mg/dL (ref 6–23)
CALCIUM: 9.3 mg/dL (ref 8.4–10.5)
CHLORIDE: 104 meq/L (ref 96–112)
CO2: 27 mEq/L (ref 19–32)
CREATININE: 0.64 mg/dL (ref 0.40–1.20)
GFR: 103.74 mL/min (ref >60.00)
Glucose, Bld: 68 mg/dL — ABNORMAL LOW (ref 70–99)
Potassium: 4.1 mEq/L (ref 3.5–5.1)
Sodium: 139 mEq/L (ref 135–145)
Total Protein: 7.3 g/dL (ref 6.0–8.3)

## 2017-07-25 LAB — CBC
HCT: 41.5 % (ref 36.0–46.0)
HEMOGLOBIN: 13.7 g/dL (ref 12.0–15.0)
MCHC: 32.9 g/dL (ref 30.0–36.0)
MCV: 93.9 fl (ref 78.0–100.0)
PLATELETS: 318 10*3/uL (ref 150.0–400.0)
RBC: 4.42 Mil/uL (ref 3.87–5.11)
RDW: 13 % (ref 11.5–15.5)
WBC: 11.2 10*3/uL — ABNORMAL HIGH (ref 4.0–10.5)

## 2017-09-29 ENCOUNTER — Telehealth: Payer: Self-pay | Admitting: Family Medicine

## 2017-09-29 DIAGNOSIS — D72829 Elevated white blood cell count, unspecified: Secondary | ICD-10-CM

## 2017-09-29 NOTE — Telephone Encounter (Signed)
Copied from [place] [id number]. Topic: Quick Communication - See Telephone Encounter >> Sep 29, 2017  4:41 PM Bea Graff, NT wrote: CRM for notification. See Telephone encounter for: 09/29/17. Pt states that Dr. Charlett Blake wanted her to come in for repeat labs for WBC being elevated back in January. Need the order put in before pt can come in for an appt.

## 2017-10-03 NOTE — Telephone Encounter (Signed)
Called left message know she can have lab work done

## 2017-10-09 ENCOUNTER — Other Ambulatory Visit (INDEPENDENT_AMBULATORY_CARE_PROVIDER_SITE_OTHER): Payer: 59

## 2017-10-09 DIAGNOSIS — D72829 Elevated white blood cell count, unspecified: Secondary | ICD-10-CM

## 2017-10-09 LAB — CBC WITH DIFFERENTIAL/PLATELET
BASOS PCT: 0.4 % (ref 0.0–3.0)
Basophils Absolute: 0 10*3/uL (ref 0.0–0.1)
EOS PCT: 2.5 % (ref 0.0–5.0)
Eosinophils Absolute: 0.2 10*3/uL (ref 0.0–0.7)
HEMATOCRIT: 39.4 % (ref 36.0–46.0)
HEMOGLOBIN: 13.4 g/dL (ref 12.0–15.0)
LYMPHS PCT: 15.7 % (ref 12.0–46.0)
Lymphs Abs: 1.5 10*3/uL (ref 0.7–4.0)
MCHC: 34 g/dL (ref 30.0–36.0)
MCV: 91.7 fl (ref 78.0–100.0)
MONO ABS: 0.4 10*3/uL (ref 0.1–1.0)
MONOS PCT: 4.4 % (ref 3.0–12.0)
Neutro Abs: 7.2 10*3/uL (ref 1.4–7.7)
Neutrophils Relative %: 77 % (ref 43.0–77.0)
Platelets: 304 10*3/uL (ref 150.0–400.0)
RBC: 4.29 Mil/uL (ref 3.87–5.11)
RDW: 13 % (ref 11.5–15.5)
WBC: 9.4 10*3/uL (ref 4.0–10.5)

## 2018-01-15 LAB — HM MAMMOGRAPHY

## 2018-01-25 ENCOUNTER — Encounter: Payer: Self-pay | Admitting: Family Medicine

## 2018-05-11 ENCOUNTER — Telehealth: Payer: Self-pay

## 2018-05-11 NOTE — Telephone Encounter (Signed)
Copied from [place] [id number]. Topic: General - Other >> May 11, 2018  3:00 PM Leward Quan A wrote: Reason for CRM: Patient called to request most recent Labs sent to Dr Raliegh Ip [facility] [phone] [facility] [address] Ph# [phone]

## 2018-05-14 ENCOUNTER — Other Ambulatory Visit: Payer: Self-pay | Admitting: Family Medicine

## 2018-05-14 NOTE — Telephone Encounter (Signed)
Most recent labs faxed to Dr. Raliegh Ip

## 2018-05-14 NOTE — Telephone Encounter (Signed)
Copied from [place] [id number]. Topic: Quick Communication - See Telephone Encounter >> May 14, 2018 12:45 PM Antonieta Iba C wrote: CRM for notification. See Telephone encounter for: 05/14/18.  Pt says that she sees Raliegh Ip for a workers comp case and says that she was advised that her PCP could take over her medication since she manages pt's medications as her PCP. Pt would like to know if that is true?   If so, pt would like a refill on her medication meloxicam (MOBIC) 15 MG tablet   Pharmacy: [id number] - [place], [place] [phone] (Phone) [phone] (Fax)

## 2018-05-15 ENCOUNTER — Ambulatory Visit (INDEPENDENT_AMBULATORY_CARE_PROVIDER_SITE_OTHER): Payer: 59 | Admitting: Certified Nurse Midwife

## 2018-05-15 ENCOUNTER — Encounter: Payer: Self-pay | Admitting: Certified Nurse Midwife

## 2018-05-15 ENCOUNTER — Other Ambulatory Visit: Payer: Self-pay

## 2018-05-15 VITALS — BP 116/64 | HR 72 | Resp 16 | Ht 65.25 in | Wt 150.0 lb

## 2018-05-15 DIAGNOSIS — Z308 Encounter for other contraceptive management: Secondary | ICD-10-CM

## 2018-05-15 DIAGNOSIS — N951 Menopausal and female climacteric states: Secondary | ICD-10-CM

## 2018-05-15 DIAGNOSIS — N898 Other specified noninflammatory disorders of vagina: Secondary | ICD-10-CM

## 2018-05-15 DIAGNOSIS — Z01419 Encounter for gynecological examination (general) (routine) without abnormal findings: Secondary | ICD-10-CM | POA: Diagnosis not present

## 2018-05-15 NOTE — Patient Instructions (Signed)

## 2018-05-15 NOTE — Telephone Encounter (Signed)
Please advise if willing to take over meloxicam script.

## 2018-05-15 NOTE — Telephone Encounter (Signed)
I am willing to take over her Meloxicam prescription

## 2018-05-15 NOTE — Progress Notes (Signed)
52 y.o. G0P0000 Married  Caucasian Fe here for annual exam. Menopausal no vaginal bleeding. Still having some vaginal dryness with sexual activity, not a concern at this time. Has continued on Loloestrin with no menses and aware due to age needs to transition to HRT if she desires or stop OCP. Sexually active, but not worried due to history of infertility. No hot flashes that she is aware. Sees Gwyneth Revels for aex and labs. No other health issues today.  No LMP recorded.          Sexually active: Yes.    The current method of family planning is OCP (estrogen/progesterone).    Exercising: Yes.    walking Smoker:  no  Review of Systems  Constitutional: Negative.   HENT: Negative.   Eyes: Negative.   Respiratory: Negative.   Cardiovascular: Negative.   Gastrointestinal: Negative.   Genitourinary: Negative.   Musculoskeletal: Negative.   Skin: Negative.   Neurological: Negative.   Endo/Heme/Allergies: Negative.   Psychiatric/Behavioral: Negative.     Health Maintenance: Pap:  04-29-16 neg HPV HR neg History of Abnormal Pap: no MMG: 01-15-18 neg Self Breast exams: yes Colonoscopy:  cologard done negative 2018 BMD:   none TDaP:  2013 Shingles: no Pneumonia: no Hep C and HIV: not done Labs: if needed   reports that she has never smoked. She has never used smokeless tobacco. She reports that she does not drink alcohol or use drugs.  Past Medical History:  Diagnosis Date  . Benign paroxysmal positional vertigo 08/25/2014  . Cerumen impaction 06/22/2012  . Dysmenorrhea   . Fibroid   . Hyperlipidemia   . Menorrhagia   . Pain of right heel 07/05/2013  . Plantar fasciitis of right foot 07/05/2013  . Preventative health care 08/22/2011  . Tinea pedis 12/02/2011    Past Surgical History:  Procedure Laterality Date  . 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 fibroidectomy    . hysteroscopic resection  5/04    Current Outpatient Medications   Medication Sig Dispense Refill  . cephALEXin (KEFLEX) 500 MG capsule Take 1 capsule (500 mg total) by mouth 4 (four) times daily. 21 capsule 0  . meloxicam (MOBIC) 15 MG tablet Take 15 mg by mouth as needed.  12  . Multiple Vitamin (MULTIVITAMIN) tablet Take 1 tablet by mouth as needed.     . NON FORMULARY daily. Omega red     . Norethindrone-Ethinyl Estradiol-Fe Biphas (LO LOESTRIN FE) 1 MG-10 MCG / 10 MCG tablet Take 1 tablet daily by mouth. 3 Package 4  . TRI-LO-MARZIA 0.18/0.215/0.25 MG-25 MCG tab TAKE 1 TABLET BY MOUTH DAILY. 84 tablet 3   No current facility-administered medications for this visit.     Family History  Problem Relation Age of Onset  . Diabetes Mother        type 2  . Heart attack Mother 58  . Hypertension Mother   . Hyperlipidemia Mother   . Stroke Mother        ministrokes  . Dementia Mother   . Heart disease Mother        MI 2005, s/p 3 stents  . Congestive Heart Failure Mother   . Kidney disease Mother   . Heart attack Maternal Grandmother 67  . Diabetes Maternal Grandmother        type 2  . Hypertension Maternal Grandmother   . Hyperlipidemia Maternal Grandmother   . Stroke Maternal Grandfather   .  Heart attack Paternal Grandfather   . Heart disease Paternal Grandfather        MI?  Marland Kitchen Hyperlipidemia Sister   . Diabetes Brother        type 2  . Pancreatitis Brother   . Alcohol abuse Brother   . Mental illness Brother   . Hyperlipidemia Brother   . Heart disease Brother        w/CAD s/p 1 stent  . Diabetes Brother        type 2  . Heart disease Father   . Pancreatitis Sister     ROS:  Pertinent items are noted in HPI.  Otherwise, a comprehensive ROS was negative.  Exam:   There were no vitals taken for this visit.   Ht Readings from Last 3 Encounters:  07/24/17 5' 5.35" (1.66 m)  05/05/17 5' 5.5" (1.664 m)  04/29/16 5' 5.5" (1.664 m)    General appearance: alert, cooperative and appears stated age Head: Normocephalic, without  obvious abnormality, atraumatic Neck: no adenopathy, supple, symmetrical, trachea midline and thyroid normal to inspection and palpation Lungs: clear to auscultation bilaterally Breasts: normal appearance, no masses or tenderness, No nipple retraction or dimpling, No nipple discharge or bleeding, No axillary or supraclavicular adenopathy Heart: regular rate and rhythm Abdomen: soft, non-tender; no masses,  no organomegaly Extremities: extremities normal, atraumatic, no cyanosis or edema Skin: Skin color, texture, turgor normal. No rashes or lesions Lymph nodes: Cervical, supraclavicular, and axillary nodes normal. No abnormal inguinal nodes palpated Neurologic: Grossly normal   Pelvic: External genitalia:  no lesions              Urethra:  normal appearing urethra with no masses, tenderness or lesions              Bartholin's and Skene's: normal                 Vagina: normal appearing vagina with normal color and discharge, no lesions              Cervix: no cervical motion tenderness, no lesions and nulliparous appearance              Pap taken: No. Bimanual Exam:  Uterus:  normal size, contour, position, consistency, mobility, non-tender              Adnexa: normal adnexa and no mass, fullness, tenderness               Rectovaginal: Confirms               Anus:  normal sphincter tone, no lesions  Chaperone present: yes  A:  Well Woman with normal exam  Perimenopausal on OCP with amenorrhea  History of infertility  Vaginal dryness  PCP management of labs and yearly aex  P:   Reviewed health and wellness pertinent to exam  Discussed stopping OCP with this last pack and after 2 weeks off come in for [facility] to assess status with perimenopause/menopause. Etiology discussed. Patient aware she may or may not have bleeding again, but needs to advise. Given printed material regarding menopause and HRT use if symptomatic. Questions addressed. Lab: [place]  Discussed coconut,  Olive oil or OTC  products for dryness if needed. Questions addressed.  Continue follow up with PCP as indicated.  Pap smear: no  counseled on breast self exam, mammography screening, feminine hygiene, adequate intake of calcium and vitamin D, diet and exercise  return annually or prn  An After Visit Summary  was printed and given to the patient.

## 2018-05-16 MED ORDER — MELOXICAM 15 MG PO TABS: 15.0000 mg | ORAL_TABLET | Freq: Every day | ORAL | 3 refills | Status: DC | PRN

## 2018-05-16 NOTE — Telephone Encounter (Signed)
Meloxicam sent to pharmacy. Spoke w/ Pt- informed Rx sent.

## 2018-05-28 ENCOUNTER — Other Ambulatory Visit (INDEPENDENT_AMBULATORY_CARE_PROVIDER_SITE_OTHER): Payer: 59

## 2018-05-28 DIAGNOSIS — N951 Menopausal and female climacteric states: Secondary | ICD-10-CM

## 2018-05-29 LAB — FOLLICLE STIMULATING HORMONE: FSH: 29.2 m[IU]/mL

## 2018-05-30 ENCOUNTER — Telehealth: Payer: Self-pay

## 2018-05-30 NOTE — Telephone Encounter (Signed)
-----   Message from Regina Eck, CNM sent at 05/30/2018 10:23 AM EST ----- Notify patient her [facility] showing perimenopause range at 29.2, range is 25.8 to 134.8 She may or may not have another period, but needs to advise if this occurs or menopausal symptoms occur. She has stopped OCP now. Not worried about contraception infertility history.

## 2018-05-30 NOTE — Telephone Encounter (Signed)
Patient notified of results. See lab 

## 2018-05-30 NOTE — Telephone Encounter (Signed)
Left message for call back.

## 2018-07-02 ENCOUNTER — Other Ambulatory Visit: Payer: Self-pay | Admitting: Certified Nurse Midwife

## 2018-07-02 DIAGNOSIS — Z3041 Encounter for surveillance of contraceptive pills: Secondary | ICD-10-CM

## 2018-07-02 NOTE — Telephone Encounter (Signed)
Patient stopped OCP's at her visit last month. Refill not appropriate.

## 2018-07-02 NOTE — Telephone Encounter (Signed)
Medication refill request: Lo Loestrin Last AEX:  05/15/18 DL Next AEX: 05/17/19 Last MMG (if hormonal medication request): 01/15/18 BIRADS 1 negative/density b Refill authorized: 05/23/17 #3 w/4 refills; today please advise; DL out of office 07/02/18

## 2018-07-30 ENCOUNTER — Encounter: Payer: 59 | Admitting: Family Medicine

## 2018-08-28 ENCOUNTER — Encounter: Payer: Self-pay | Admitting: Family Medicine

## 2018-08-28 ENCOUNTER — Ambulatory Visit (INDEPENDENT_AMBULATORY_CARE_PROVIDER_SITE_OTHER): Payer: 59 | Admitting: Family Medicine

## 2018-08-28 ENCOUNTER — Telehealth: Payer: Self-pay | Admitting: Family Medicine

## 2018-08-28 DIAGNOSIS — E782 Mixed hyperlipidemia: Secondary | ICD-10-CM | POA: Diagnosis not present

## 2018-08-28 DIAGNOSIS — E162 Hypoglycemia, unspecified: Secondary | ICD-10-CM

## 2018-08-28 DIAGNOSIS — Z0000 Encounter for general adult medical examination without abnormal findings: Secondary | ICD-10-CM

## 2018-08-28 NOTE — Assessment & Plan Note (Signed)
Check hgba1c today and maintain small frequent meals with protein

## 2018-08-28 NOTE — Assessment & Plan Note (Addendum)
Patient encouraged to maintain heart healthy diet, regular exercise, adequate sleep. Consider daily probiotics. Take medications as prescribed. Cologuard was done in 2018 will do colonoscopy next year because her insurance did not cover her cologuard. She is encouraged to start the Shingrix shots.

## 2018-08-28 NOTE — Assessment & Plan Note (Signed)
Encouraged heart healthy diet, increase exercise, avoid trans fats, consider a krill oil cap daily 

## 2018-08-28 NOTE — Telephone Encounter (Signed)
Copied from [place] [id number]. Topic: General - Other >> Aug 28, 2018  5:28 PM Jasmin Taylor R wrote: Patient stated she was asked if she had any diabetic symptoms at her appt and she stated she forgot to mention urinary frequency

## 2018-08-28 NOTE — Progress Notes (Signed)
Subjective:    Patient ID: Jasmin Taylor, female    DOB: 04-02-1966, 53 y.o.   MRN: [id number]  No chief complaint on file.   HPI Patient is in today for annual preventative exam and overall she is doing well. She has just lost her step father who raised her so she is sad but she is managing well most days. No recent febrile illness or hospitalizations. She is managing her activities of daily living and stays active. Maintains a heart healthy diet. Denies CP/palp/SOB/HA/congestion/fevers/GI or GU c/o. Taking meds as prescribed  Past Medical History:  Diagnosis Date  . Benign paroxysmal positional vertigo 08/25/2014  . Cerumen impaction 06/22/2012  . Dysmenorrhea   . Fibroid   . Hyperlipidemia   . Menorrhagia   . Pain of right heel 07/05/2013  . Plantar fasciitis of right foot 07/05/2013  . Preventative health care 08/22/2011  . Tinea pedis 12/02/2011    Past Surgical History:  Procedure Laterality Date  . 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 fibroidectomy    . hysteroscopic resection  5/04    Family History  Problem Relation Age of Onset  . Diabetes Mother        type 2  . Heart attack Mother 58  . Hypertension Mother   . Hyperlipidemia Mother   . Stroke Mother        ministrokes  . Dementia Mother   . Heart disease Mother        MI 2005, s/p 3 stents  . Congestive Heart Failure Mother   . Kidney disease Mother   . Heart attack Maternal Grandmother 67  . Diabetes Maternal Grandmother        type 2  . Hypertension Maternal Grandmother   . Hyperlipidemia Maternal Grandmother   . Stroke Maternal Grandfather   . Heart attack Paternal Grandfather   . Heart disease Paternal Grandfather        MI?  Marland Kitchen Hyperlipidemia Sister   . Diabetes Brother        type 2  . Pancreatitis Brother   . Alcohol abuse Brother   . Mental illness Brother   . Hyperlipidemia Brother   . Heart disease Brother        w/CAD s/p 1 stent  . Diabetes  Brother        type 2  . Heart disease Father   . Pancreatitis Sister     Social History   Socioeconomic History  . Marital status: Married    Spouse name: Not on file  . Number of children: Not on file  . Years of education: Not on file  . Highest education level: Not on file  Occupational History  . Not on file  Social Needs  . Financial resource strain: Not on file  . Food insecurity:    Worry: Not on file    Inability: Not on file  . Transportation needs:    Medical: Not on file    Non-medical: Not on file  Tobacco Use  . Smoking status: Never Smoker  . Smokeless tobacco: Never Used  Substance and Sexual Activity  . Alcohol use: No  . Drug use: No  . Sexual activity: Yes    Partners: Male    Birth control/protection: Pill  Lifestyle  . Physical activity:    Days per week: Not on file    Minutes per session: Not on file  . Stress: Not on file  Relationships  . Social connections:    Talks on phone: Not on file    Gets together: Not on file    Attends religious service: Not on file    Active member of club or organization: Not on file    Attends meetings of clubs or organizations: Not on file    Relationship status: Not on file  . Intimate partner violence:    Fear of current or ex partner: Not on file    Emotionally abused: Not on file    Physically abused: Not on file    Forced sexual activity: Not on file  Other Topics Concern  . Not on file  Social History Narrative   No major dietary restrictions   Walks the dogs, 2    Works for the [employer]      Lives with husband    Outpatient Medications Prior to Visit  Medication Sig Dispense Refill  . meloxicam (MOBIC) 15 MG tablet Take 1 tablet (15 mg total) by mouth daily as needed for pain. 30 tablet 3  . Multiple Vitamin (MULTIVITAMIN) tablet Take 1 tablet by mouth as needed.     . NON FORMULARY daily. Omega red     . Norethindrone-Ethinyl Estradiol-Fe Biphas (LO LOESTRIN FE) 1 MG-10 MCG / 10  MCG tablet Take 1 tablet daily by mouth. 3 Package 4   No facility-administered medications prior to visit.     No Known Allergies  Review of Systems  Constitutional: Negative for fever and malaise/fatigue.  HENT: Negative for congestion.   Eyes: Negative for blurred vision.  Respiratory: Negative for shortness of breath.   Cardiovascular: Negative for chest pain, palpitations and leg swelling.  Gastrointestinal: Negative for abdominal pain, blood in stool and nausea.  Genitourinary: Negative for dysuria and frequency.  Musculoskeletal: Negative for falls.  Skin: Negative for rash.  Neurological: Negative for dizziness, loss of consciousness and headaches.  Endo/Heme/Allergies: Negative for environmental allergies.  Psychiatric/Behavioral: Negative for depression. The patient is not nervous/anxious.        Objective:    Physical Exam Vitals signs and nursing note reviewed.  Constitutional:      General: She is not in acute distress.    Appearance: She is well-developed.  HENT:     Head: Normocephalic and atraumatic.     Nose: Nose normal.  Eyes:     General:        Right eye: No discharge.        Left eye: No discharge.  Neck:     Musculoskeletal: Normal range of motion and neck supple.  Cardiovascular:     Rate and Rhythm: Normal rate and regular rhythm.     Heart sounds: No murmur.  Pulmonary:     Effort: Pulmonary effort is normal.     Breath sounds: Normal breath sounds.  Abdominal:     General: Bowel sounds are normal.     Palpations: Abdomen is soft.     Tenderness: There is no abdominal tenderness.  Skin:    General: Skin is warm and dry.  Neurological:     Mental Status: She is alert and oriented to person, place, and time.     BP 120/72 (BP Location: Left Arm, Patient Position: Sitting, Cuff Size: Normal)   Pulse 78   Temp 98.1 F (36.7 C) (Oral)   Resp 18   Ht 5\' 5"  (1.651 m)   Wt 150 lb 6.4 oz (68.2 kg)   SpO2 97%   BMI 25.03  kg/m  Wt  Readings from Last 3 Encounters:  08/28/18 150 lb 6.4 oz (68.2 kg)  05/15/18 150 lb (68 kg)  07/24/17 148 lb 3.2 oz (67.2 kg)     Lab Results  Component Value Date   WBC 9.4 10/09/2017   HGB 13.4 10/09/2017   HCT 39.4 10/09/2017   PLT 304.0 10/09/2017   GLUCOSE 68 (L) 07/24/2017   CHOL 197 07/24/2017   TRIG 154.0 (H) 07/24/2017   HDL 55.40 07/24/2017   LDLCALC 111 (H) 07/24/2017   ALT 16 07/24/2017   AST 14 07/24/2017   NA 139 07/24/2017   K 4.1 07/24/2017   CL 104 07/24/2017   CREATININE 0.64 07/24/2017   BUN 14 07/24/2017   CO2 27 07/24/2017   TSH 0.72 07/24/2017    Lab Results  Component Value Date   TSH 0.72 07/24/2017   Lab Results  Component Value Date   WBC 9.4 10/09/2017   HGB 13.4 10/09/2017   HCT 39.4 10/09/2017   MCV 91.7 10/09/2017   PLT 304.0 10/09/2017   Lab Results  Component Value Date   NA 139 07/24/2017   K 4.1 07/24/2017   CO2 27 07/24/2017   GLUCOSE 68 (L) 07/24/2017   BUN 14 07/24/2017   CREATININE 0.64 07/24/2017   BILITOT 0.5 07/24/2017   ALKPHOS 79 07/24/2017   AST 14 07/24/2017   ALT 16 07/24/2017   PROT 7.3 07/24/2017   ALBUMIN 4.2 07/24/2017   CALCIUM 9.3 07/24/2017   GFR 103.74 07/24/2017   Lab Results  Component Value Date   CHOL 197 07/24/2017   Lab Results  Component Value Date   HDL 55.40 07/24/2017   Lab Results  Component Value Date   LDLCALC 111 (H) 07/24/2017   Lab Results  Component Value Date   TRIG 154.0 (H) 07/24/2017   Lab Results  Component Value Date   CHOLHDL 4 07/24/2017   No results found for: HGBA1C     Assessment & Plan:   Problem List Items Addressed This Visit    Hyperlipidemia    Encouraged heart healthy diet, increase exercise, avoid trans fats, consider a krill oil cap daily      Relevant Orders   Lipid panel   TSH   Preventative health care    Patient encouraged to maintain heart healthy diet, regular exercise, adequate sleep. Consider daily probiotics. Take medications as  prescribed. Cologuard was done in 2018 will do colonoscopy next year because her insurance did not cover her cologuard. She is encouraged to start the Shingrix shots.      Relevant Orders   CBC   Comprehensive metabolic panel   TSH   Hypoglycemia    Check hgba1c today and maintain small frequent meals with protein      Relevant Orders   Comprehensive metabolic panel   Hemoglobin A1c      I have discontinued Curt Bears L. Zullo's Norethindrone-Ethinyl Estradiol-Fe Biphas. I am also having her maintain her multivitamin, NON FORMULARY, and meloxicam.  No orders of the defined types were placed in this encounter.    Penni Homans, MD

## 2018-08-28 NOTE — Patient Instructions (Signed)
Shingrix is the new shingles, 2 shots over 2-6 months, check with insurance regarding coverage and then can get it at the pharmacy or at home Preventive Care 40-64 Years, Female Preventive care refers to lifestyle choices and visits with your health care provider that can promote health and wellness. What does preventive care include?   A yearly physical exam. This is also called an annual well check.  Dental exams once or twice a year.  Routine eye exams. Ask your health care provider how often you should have your eyes checked.  Personal lifestyle choices, including: ? Daily care of your teeth and gums. ? Regular physical activity. ? Eating a healthy diet. ? Avoiding tobacco and drug use. ? Limiting alcohol use. ? Practicing safe sex. ? Taking low-dose aspirin daily starting at age 40. ? Taking vitamin and mineral supplements as recommended by your health care provider. What happens during an annual well check? The services and screenings done by your health care provider during your annual well check will depend on your age, overall health, lifestyle risk factors, and family history of disease. Counseling Your health care provider may ask you questions about your:  Alcohol use.  Tobacco use.  Drug use.  Emotional well-being.  Home and relationship well-being.  Sexual activity.  Eating habits.  Work and work Statistician.  Method of birth control.  Menstrual cycle.  Pregnancy history. Screening You may have the following tests or measurements:  Height, weight, and BMI.  Blood pressure.  Lipid and cholesterol levels. These may be checked every 5 years, or more frequently if you are over 68 years old.  Skin check.  Lung cancer screening. You may have this screening every year starting at age 18 if you have a 30-pack-year history of smoking and currently smoke or have quit within the past 15 years.  Colorectal cancer screening. All adults should have this  screening starting at age 45 and continuing until age 30. Your health care provider may recommend screening at age 48. You will have tests every 1-10 years, depending on your results and the type of screening test. People at increased risk should start screening at an earlier age. Screening tests may include: ? Guaiac-based fecal occult blood testing. ? Fecal immunochemical test (FIT). ? Stool DNA test. ? Virtual colonoscopy. ? Sigmoidoscopy. During this test, a flexible tube with a tiny camera (sigmoidoscope) is used to examine your rectum and lower colon. The sigmoidoscope is inserted through your anus into your rectum and lower colon. ? Colonoscopy. During this test, a long, thin, flexible tube with a tiny camera (colonoscope) is used to examine your entire colon and rectum.  Hepatitis C blood test.  Hepatitis B blood test.  Sexually transmitted disease (STD) testing.  Diabetes screening. This is done by checking your blood sugar (glucose) after you have not eaten for a while (fasting). You may have this done every 1-3 years.  Mammogram. This may be done every 1-2 years. Talk to your health care provider about when you should start having regular mammograms. This may depend on whether you have a family history of breast cancer.  BRCA-related cancer screening. This may be done if you have a family history of breast, ovarian, tubal, or peritoneal cancers.  Pelvic exam and Pap test. This may be done every 3 years starting at age 79. Starting at age 54, this may be done every 5 years if you have a Pap test in combination with an HPV test.  Bone density scan.  This is done to screen for osteoporosis. You may have this scan if you are at high risk for osteoporosis. Discuss your test results, treatment options, and if necessary, the need for more tests with your health care provider. Vaccines Your health care provider may recommend certain vaccines, such as:  Influenza vaccine. This is  recommended every year.  Tetanus, diphtheria, and acellular pertussis (Tdap, Td) vaccine. You may need a Td booster every 10 years.  Varicella vaccine. You may need this if you have not been vaccinated.  Zoster vaccine. You may need this after age 64.  Measles, mumps, and rubella (MMR) vaccine. You may need at least one dose of MMR if you were born in 1957 or later. You may also need a second dose.  Pneumococcal 13-valent conjugate (PCV13) vaccine. You may need this if you have certain conditions and were not previously vaccinated.  Pneumococcal polysaccharide (PPSV23) vaccine. You may need one or two doses if you smoke cigarettes or if you have certain conditions.  Meningococcal vaccine. You may need this if you have certain conditions.  Hepatitis A vaccine. You may need this if you have certain conditions or if you travel or work in places where you may be exposed to hepatitis A.  Hepatitis B vaccine. You may need this if you have certain conditions or if you travel or work in places where you may be exposed to hepatitis B.  Haemophilus influenzae type b (Hib) vaccine. You may need this if you have certain conditions. Talk to your health care provider about which screenings and vaccines you need and how often you need them. This information is not intended to replace advice given to you by your health care provider. Make sure you discuss any questions you have with your health care provider. Document Released: 07/24/2015 Document Revised: 08/17/2017 Document Reviewed: 04/28/2015 Elsevier Interactive Patient Education  2019 [employer].

## 2018-08-29 LAB — LIPID PANEL
Cholesterol: 179 mg/dL (ref 0–200)
HDL: 64 mg/dL (ref >39.00)
LDL Cholesterol: 98 mg/dL (ref 0–99)
NONHDL: 115.34
Total CHOL/HDL Ratio: 3
Triglycerides: 87 mg/dL (ref 0.0–149.0)
VLDL: 17.4 mg/dL (ref 0.0–40.0)

## 2018-08-29 LAB — COMPREHENSIVE METABOLIC PANEL
ALT: 25 U/L (ref 0–35)
AST: 17 U/L (ref 0–37)
Albumin: 4.3 g/dL (ref 3.5–5.2)
Alkaline Phosphatase: 102 U/L (ref 39–117)
BILIRUBIN TOTAL: 0.5 mg/dL (ref 0.2–1.2)
BUN: 18 mg/dL (ref 6–23)
CO2: 28 mEq/L (ref 19–32)
CREATININE: 0.66 mg/dL (ref 0.40–1.20)
Calcium: 9.8 mg/dL (ref 8.4–10.5)
Chloride: 104 mEq/L (ref 96–112)
GFR: 93.8 mL/min (ref >60.00)
GLUCOSE: 77 mg/dL (ref 70–99)
Potassium: 5.3 mEq/L — ABNORMAL HIGH (ref 3.5–5.1)
Sodium: 141 mEq/L (ref 135–145)
TOTAL PROTEIN: 6.9 g/dL (ref 6.0–8.3)

## 2018-08-29 LAB — TSH: TSH: 0.63 u[IU]/mL (ref 0.35–4.50)

## 2018-08-29 LAB — CBC
HEMATOCRIT: 40.9 % (ref 36.0–46.0)
HEMOGLOBIN: 13.9 g/dL (ref 12.0–15.0)
MCHC: 34 g/dL (ref 30.0–36.0)
MCV: 92.5 fl (ref 78.0–100.0)
Platelets: 270 10*3/uL (ref 150.0–400.0)
RBC: 4.43 Mil/uL (ref 3.87–5.11)
RDW: 13.2 % (ref 11.5–15.5)
WBC: 9.7 10*3/uL (ref 4.0–10.5)

## 2018-08-29 LAB — HEMOGLOBIN A1C: HEMOGLOBIN A1C: 5.5 % (ref 4.6–6.5)

## 2018-08-29 NOTE — Telephone Encounter (Signed)
Please advise 

## 2018-08-29 NOTE — Telephone Encounter (Signed)
noted 

## 2018-09-03 ENCOUNTER — Telehealth: Payer: Self-pay | Admitting: *Deleted

## 2018-09-03 NOTE — Telephone Encounter (Signed)
Received Medical records from [facility]; forwarded to provider/SLS 02/24

## 2018-09-13 ENCOUNTER — Encounter: Payer: Self-pay | Admitting: Family Medicine

## 2018-09-13 ENCOUNTER — Ambulatory Visit: Payer: Self-pay

## 2018-09-13 NOTE — Telephone Encounter (Signed)
Patient called in with c/o "dizziness." She says "yesterday I was in the shower bending over to blow dry my hair and I felt vertigo, the room was spinning. I raised my head up and I fell back and hit my head. I didn't pass out, but I have a sore bump on my head. I lay in the bed and I felt the room spinning when I turned over, not bad. I was able to walk. Today, I'm fine, no dizziness, no vertigo. I don't know if it's inner ear or me being dehydrated." I asked about other symptoms, she denies. According to protocol, see PCP within 3 days, no availability with PCP, appointment scheduled for tomorrow at 1400 with Mackie Pai, [facility], care advice given, patient verbalized understanding.  Reason for Disposition . [1] MILD dizziness (e.g., vertigo; walking normally) AND [2] has NOT been evaluated by physician for this  Answer Assessment - Initial Assessment Questions 1. DESCRIPTION: "Describe your dizziness."     Room spinning 2. VERTIGO: "Do you feel like either you or the room is spinning or tilting?"      Yes 3. LIGHTHEADED: "Do you feel lightheaded?" (e.g., somewhat faint, woozy, weak upon standing)     No 4. SEVERITY: "How bad is it?"  "Can you walk?"   - MILD - Feels unsteady but walking normally.   - MODERATE - Feels very unsteady when walking, but not falling; interferes with normal activities (e.g., school, work) .   - SEVERE - Unable to walk without falling (requires assistance).     Mild 5. ONSET:  "When did the dizziness begin?"     Yesterday, feel fine now 6. AGGRAVATING FACTORS: "Does anything make it worse?" (e.g., standing, change in head position)     Change in head position 7. CAUSE: "What do you think is causing the dizziness?"     I don't know 8. RECURRENT SYMPTOM: "Have you had dizziness before?" If so, ask: "When was the last time?" "What happened that time?"     Yes, inner ear 9. OTHER SYMPTOMS: "Do you have any other symptoms?" (e.g., headache, weakness, numbness,  vomiting, earache)     No 10. PREGNANCY: "Is there any chance you are pregnant?" "When was your last menstrual period?"       No  Protocols used: DIZZINESS - VERTIGO-A-AH

## 2018-09-14 ENCOUNTER — Telehealth: Payer: Self-pay | Admitting: Medical

## 2018-09-14 ENCOUNTER — Encounter: Payer: Self-pay | Admitting: Medical

## 2018-09-14 ENCOUNTER — Ambulatory Visit (INDEPENDENT_AMBULATORY_CARE_PROVIDER_SITE_OTHER): Payer: 59 | Admitting: Medical

## 2018-09-14 ENCOUNTER — Other Ambulatory Visit (INDEPENDENT_AMBULATORY_CARE_PROVIDER_SITE_OTHER): Payer: 59

## 2018-09-14 VITALS — BP 136/75 | HR 76 | Temp 98.0°F | Resp 15 | Ht 65.0 in | Wt 152.8 lb

## 2018-09-14 DIAGNOSIS — E875 Hyperkalemia: Secondary | ICD-10-CM

## 2018-09-14 DIAGNOSIS — R42 Dizziness and giddiness: Secondary | ICD-10-CM

## 2018-09-14 LAB — COMPREHENSIVE METABOLIC PANEL
ALT: 32 U/L (ref 0–35)
AST: 17 U/L (ref 0–37)
Albumin: 4.4 g/dL (ref 3.5–5.2)
Alkaline Phosphatase: 100 U/L (ref 39–117)
BILIRUBIN TOTAL: 0.6 mg/dL (ref 0.2–1.2)
BUN: 12 mg/dL (ref 6–23)
CO2: 28 meq/L (ref 19–32)
Calcium: 9.4 mg/dL (ref 8.4–10.5)
Chloride: 104 mEq/L (ref 96–112)
Creatinine, Ser: 0.68 mg/dL (ref 0.40–1.20)
GFR: 90.61 mL/min (ref >60.00)
Glucose, Bld: 80 mg/dL (ref 70–99)
Potassium: 4 mEq/L (ref 3.5–5.1)
Sodium: 139 mEq/L (ref 135–145)
Total Protein: 6.8 g/dL (ref 6.0–8.3)

## 2018-09-14 MED ORDER — MECLIZINE HCL 12.5 MG PO TABS: 12.5000 mg | ORAL_TABLET | Freq: Three times a day (TID) | ORAL | 0 refills | Status: DC | PRN

## 2018-09-14 NOTE — Progress Notes (Signed)
Subjective:    Patient ID: Jasmin Taylor, female    DOB: 1966/01/08, 53 y.o.   MRN: [id number]  HPI Pt in for evaluation. She states she had a bad dizzy episode on Wednesday. She was bent over blow drying her hair. She somehow landed on her buttox. Bumped her head on  bathroom floor. No LOC. No nausea, no vomiting. No vision changes. No gross motor/sensory function deficits. No headache recently(no scalp pain). No nasal congestion, or ear pressure.  Pt in past had some vertigo in past laying in bed. Last couple of nights has noticed. When had head turned to left seem to get more dizzy. Also rolling over in bed recently had some vertigo.  Pt admits she she does not drink a lot of water.  Pt had no anemia last week.  Pt states yesterday less dizziness.   Today feels fine during interview.(but blow drying her hair in am turning head mild light headed)    Review of Systems  Constitutional: Negative for chills, fatigue and fever.  Respiratory: Negative for cough, chest tightness and wheezing.   Cardiovascular: Negative for chest pain and palpitations.  Gastrointestinal: Negative for abdominal distention, abdominal pain and constipation.  Genitourinary: Negative for difficulty urinating, dyspareunia, enuresis, hematuria and urgency.  Musculoskeletal: Negative for back pain.  Skin: Negative for rash.  Neurological: Positive for dizziness and light-headedness. Negative for tremors, seizures, facial asymmetry, speech difficulty, weakness, numbness and headaches.       See hpi.  Psychiatric/Behavioral: Negative for agitation, behavioral problems, dysphoric mood and suicidal ideas. The patient is not nervous/anxious.     Past Medical History:  Diagnosis Date  . Benign paroxysmal positional vertigo 08/25/2014  . Cerumen impaction 06/22/2012  . Dysmenorrhea   . Fibroid   . Hyperlipidemia   . Menorrhagia   . Pain of right heel 07/05/2013  . Plantar fasciitis of right foot 07/05/2013    . Preventative health care 08/22/2011  . Tinea pedis 12/02/2011     Social History   Socioeconomic History  . Marital status: Married    Spouse name: Not on file  . Number of children: Not on file  . Years of education: Not on file  . Highest education level: Not on file  Occupational History  . Not on file  Social Needs  . Financial resource strain: Not on file  . Food insecurity:    Worry: Not on file    Inability: Not on file  . Transportation needs:    Medical: Not on file    Non-medical: Not on file  Tobacco Use  . Smoking status: Never Smoker  . Smokeless tobacco: Never Used  Substance and Sexual Activity  . Alcohol use: No  . Drug use: No  . Sexual activity: Yes    Partners: Male    Birth control/protection: Pill  Lifestyle  . Physical activity:    Days per week: Not on file    Minutes per session: Not on file  . Stress: Not on file  Relationships  . Social connections:    Talks on phone: Not on file    Gets together: Not on file    Attends religious service: Not on file    Active member of club or organization: Not on file    Attends meetings of clubs or organizations: Not on file    Relationship status: Not on file  . Intimate partner violence:    Fear of current or ex partner: Not on file  Emotionally abused: Not on file    Physically abused: Not on file    Forced sexual activity: Not on file  Other Topics Concern  . Not on file  Social History Narrative   No major dietary restrictions   Walks the dogs, 2    Works for [employer]      Lives with husband    Past Surgical History:  Procedure Laterality Date  . 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 fibroidectomy    . hysteroscopic resection  5/04    Family History  Problem Relation Age of Onset  . Diabetes Mother        type 2  . Heart attack Mother 70  . Hypertension Mother   . Hyperlipidemia Mother   . Stroke Mother        ministrokes  .  Dementia Mother   . Heart disease Mother        MI 2005, s/p 3 stents  . Congestive Heart Failure Mother   . Kidney disease Mother   . Heart attack Maternal Grandmother 67  . Diabetes Maternal Grandmother        type 2  . Hypertension Maternal Grandmother   . Hyperlipidemia Maternal Grandmother   . Stroke Maternal Grandfather   . Heart attack Paternal Grandfather   . Heart disease Paternal Grandfather        MI?  Marland Kitchen Hyperlipidemia Sister   . Diabetes Brother        type 2  . Pancreatitis Brother   . Alcohol abuse Brother   . Mental illness Brother   . Hyperlipidemia Brother   . Heart disease Brother        w/CAD s/p 1 stent  . Diabetes Brother        type 2  . Heart disease Father   . Pancreatitis Sister     No Known Allergies  Current Outpatient Medications on File Prior to Visit  Medication Sig Dispense Refill  . meloxicam (MOBIC) 15 MG tablet Take 1 tablet (15 mg total) by mouth daily as needed for pain. 30 tablet 3  . Multiple Vitamin (MULTIVITAMIN) tablet Take 1 tablet by mouth as needed.     . NON FORMULARY daily. Omega red      No current facility-administered medications on file prior to visit.     BP 136/75   Pulse 76   Temp 98 F (36.7 C) (Oral)   Resp 15   Ht 5\' 5"  (1.651 m)   Wt 152 lb 12.8 oz (69.3 kg)   SpO2 99%   BMI 25.43 kg/m       Objective:   Physical Exam  General Mental Status- Alert. General Appearance- Not in acute distress.   Skin General: Color- Normal Color. Moisture- Normal Moisture.  Neck Carotid Arteries- Normal color. Moisture- Normal Moisture. No carotid bruits. No JVD.  Chest and Lung Exam Auscultation: Breath Sounds:-Normal.  Cardiovascular Auscultation:Rythm- Regular. Murmurs & Other Heart Sounds:Auscultation of the heart reveals- No Murmurs.  Neurologic Cranial Nerve exam:- CN III-XII intact(No nystagmus), symmetric smile. Drift Test:- No drift. Romberg Exam:- Negative.  Heal to Toe Gait  exam:-Normal. Finger to Nose:- Normal/Intact Strength:- 5/5 equal and symmetric strength both upper and lower extremities. Lying supine- slight turning head to right presently cause brief light headed sensatin   HEENT Head- Normal. Ear Auditory Canal - Left- Normal. Right - Normal.Tympanic Membrane- Left- Normal. Right- Normal. Eye Sclera/Conjunctiva- Left-  Normal. Right- Normal. Nose & Sinuses Nasal Mucosa- Left-  Boggy and Congested. Right-  Boggy and  Congested.Bilateral no maxillary and no frontal sinus pressure. Mouth & Throat Lips: Upper Lip- Normal: no dryness, cracking, pallor, cyanosis, or vesicular eruption. Lower Lip-Normal: no dryness, cracking, pallor, cyanosis or vesicular eruption. Buccal Mucosa- Bilateral- No Aphthous ulcers. Oropharynx- No Discharge or Erythema. Tonsils: Characteristics- Bilateral- No Erythema or Congestion. Size/Enlargement- Bilateral- No enlargement. Discharge- bilateral-None.            Assessment & Plan:  Your symptoms recently arm more in line with benign positional vertigo.  Symptoms very minimal/transient over the last 24 hours and appear mostly on position change.  No describe worrisome associated neurologic type signs and symptoms.  You had very good neurologic exam today.  You had normal CBC weeks ago showing no anemia.  You have a repeat metabolic panel pending that was already drawn earlier today.  This might show electrolyte abnormality that could be contributing factor.  That test is going to Dr. Charlett Blake asking her to keep an eye out for the result.  With your vertigo type features, I would recommend conservative treatment with Epley maneuvers.  These are printed today and you could also go online and look at Epley maneuvers being performed.  If you have dizziness/vertigo that is lasting for more than 13minutes/not resolving then could use meclizine over-the-counter.  But for brief transient episodes I do not think meclizine would be  worthwhile.  If you get any dizziness or vertigo with any neurologic type signs or symptoms or motor deficits then recommend ED evaluation.(In that event you would likely get CT imaging.)  On review today I do not think CT of your head is indicated.   Follow-up in 7 to 10 days or as needed.  Mackie Pai, PA-C

## 2018-09-14 NOTE — Patient Instructions (Addendum)
Your symptoms recently are more in line with benign positional vertigo.  Symptoms very minimal/transient over the last 24 hours and appear mostly on position change.  No described worrisome associated neurologic type signs and symptoms.  You had very good neurologic exam today.  You had normal CBC weeks ago showing no anemia.  You have a repeat metabolic panel pending that was already drawn earlier today.  This might show electrolyte abnormality that could be contributing factor.  That test is going to Dr. Charlett Blake asking her to keep an eye out for the result.  With your vertigo type features, I would recommend conservative treatment with Epley maneuvers.  These are printed today and you could also go online and look at Epley maneuvers being performed.  If you have dizziness/vertigo that is lasting for more than 76minutes/not resolving then could use meclizine over-the-counter.  But for brief transient episodes I do not think meclizine would be worthwhile.  If you get any dizziness or vertigo with any neurologic type signs or symptoms or motor deficits then recommend ED evaluation.(In that event you would likely get CT imaging.)  On review today I do not think CT of your head is indicated.  Follow-up in 7 to 10 days or as needed.  How to Perform the Epley Maneuver The Epley maneuver is an exercise that relieves symptoms of vertigo. Vertigo is the feeling that you or your surroundings are moving when they are not. When you feel vertigo, you may feel like the room is spinning and have trouble walking. Dizziness is a little different than vertigo. When you are dizzy, you may feel unsteady or light-headed. You can do this maneuver at home whenever you have symptoms of vertigo. You can do it up to 3 times a day until your symptoms go away. Even though the Epley maneuver may relieve your vertigo for a few weeks, it is possible that your symptoms will return. This maneuver relieves vertigo, but it does not  relieve dizziness. What are the risks? If it is done correctly, the Epley maneuver is considered safe. Sometimes it can lead to dizziness or nausea that goes away after a short time. If you develop other symptoms, such as changes in vision, weakness, or numbness, stop doing the maneuver and call your health care provider. How to perform the Epley maneuver 1. Sit on the edge of a bed or table with your back straight and your legs extended or hanging over the edge of the bed or table. 2. Turn your head halfway toward the affected ear or side. 3. Lie backward quickly with your head turned until you are lying flat on your back. You may want to position a pillow under your shoulders. 4. Hold this position for 30 seconds. You may experience an attack of vertigo. This is normal. 5. Turn your head to the opposite direction until your unaffected ear is facing the floor. 6. Hold this position for 30 seconds. You may experience an attack of vertigo. This is normal. Hold this position until the vertigo stops. 7. Turn your whole body to the same side as your head. Hold for another 30 seconds. 8. Sit back up. You can repeat this exercise up to 3 times a day. Follow these instructions at home:  After doing the Epley maneuver, you can return to your normal activities.  Ask your health care provider if there is anything you should do at home to prevent vertigo. He or she may recommend that you: ? Keep your  head raised (elevated) with two or more pillows while you sleep. ? Do not sleep on the side of your affected ear. ? Get up slowly from bed. ? Avoid sudden movements during the day. ? Avoid extreme head movement, like looking up or bending over. Contact a health care provider if:  Your vertigo gets worse.  You have other symptoms, including: ? Nausea. ? Vomiting. ? Headache. Get help right away if:  You have vision changes.  You have a severe or worsening headache or neck pain.  You cannot stop  vomiting.  You have new numbness or weakness in any part of your body. Summary  Vertigo is the feeling that you or your surroundings are moving when they are not.  The Epley maneuver is an exercise that relieves symptoms of vertigo.  If the Epley maneuver is done correctly, it is considered safe. You can do it up to 3 times a day. This information is not intended to replace advice given to you by your health care provider. Make sure you discuss any questions you have with your health care provider. Document Released: 07/02/2013 Document Revised: 05/17/2016 Document Reviewed: 05/17/2016 Elsevier Interactive Patient Education  2019 [employer].

## 2018-09-14 NOTE — Telephone Encounter (Signed)
Dr. Charlett Blake, Pt seen today for vertigo. Seems benign positional. As part of work up considered cmp. I saw that you already ordered as future. Just wanted to mention since study coming to you. She did test before she saw me as follow up for hyperkalemia.  Thanks, Percell Miller

## 2018-09-26 ENCOUNTER — Other Ambulatory Visit: Payer: Self-pay | Admitting: Family Medicine

## 2018-10-05 ENCOUNTER — Telehealth: Payer: Self-pay | Admitting: Certified Nurse Midwife

## 2018-10-05 NOTE — Telephone Encounter (Signed)
Left message to call Antonin Meininger, RN at [facility] [phone].   

## 2018-10-05 NOTE — Telephone Encounter (Signed)
Call returned to patient, offered OV or telephone visit to further discuss with Dr. Quincy Simmonds. OV scheduled for 3/30 at 1:30pm with Dr. Quincy Simmonds.   Routing to provider for final review. Patient is agreeable to disposition. Will close encounter.

## 2018-10-05 NOTE — Telephone Encounter (Signed)
Spoke with patient. Last FSH 29.2 on 05/28/18, stopped OCP wks prior to labs. Bleeding 12/31 /1/2, 1/17-1/20 and again 3/8 -3/12. Last menses was a little heavier, changed regular pad 3x/day. Mild menses like cramps with bleeding. Denies vasomotor symptoms or pain. Patient calling to provide update and requesting recommendations for "non-hormonal contraceptives". Advised I will review with covering provider and return call to advise. Patient verbalizes understanding and is agreeable.   Dr. Quincy Simmonds -please review and advise.

## 2018-10-05 NOTE — Telephone Encounter (Signed)
Spoke with patient, advised as seen below per Dr. Quincy Simmonds. Patient states Melvia Heaps, CNM recommended non-hormonal contraceptive after age 53, was on LoLoestrin previously, would like non-hormonal pill. Advised will review with Dr. Quincy Simmonds and return call, patient agreeable.   Dr. Quincy Simmonds -please advise on POP.

## 2018-10-05 NOTE — Telephone Encounter (Signed)
Ok to return to [employer], 90 day Rx with 2 refills.  Due to have mammogram in July 2020.

## 2018-10-05 NOTE — Telephone Encounter (Signed)
Patient stated hat she was asked to call and inform our office about bleeding. Patient stated that she experienced bleeding 07/10/2018-07/12/2018, 07/27/2018-07/30/2018 and  09/16/2018-09/20/2018. Patient stated that the bleeding was heavier than normal 09/16/2018-09/20/2018.

## 2018-10-08 ENCOUNTER — Ambulatory Visit: Payer: Self-pay | Admitting: Obstetrics and Gynecology

## 2018-10-11 ENCOUNTER — Other Ambulatory Visit: Payer: Self-pay | Admitting: Obstetrics and Gynecology

## 2018-10-11 ENCOUNTER — Other Ambulatory Visit: Payer: Self-pay

## 2018-10-11 ENCOUNTER — Ambulatory Visit (INDEPENDENT_AMBULATORY_CARE_PROVIDER_SITE_OTHER): Payer: 59 | Admitting: Obstetrics and Gynecology

## 2018-10-11 DIAGNOSIS — N951 Menopausal and female climacteric states: Secondary | ICD-10-CM

## 2018-10-11 MED ORDER — NORETHIN-ETH ESTRAD-FE BIPHAS 1 MG-10 MCG / 10 MCG PO TABS: 1.0000 | ORAL_TABLET | Freq: Every day | ORAL | 2 refills | Status: DC

## 2018-10-11 NOTE — Progress Notes (Signed)
GYNECOLOGY  VISIT   HPI: 53 y.o.   Married  Caucasian  female   G0P0000 with No LMP recorded. (Menstrual status: Other).   here for   Audio WebEx consultation regarding her menstrual cycles off of COCs.  Patient gives permission for this visit.  Starla Curl assisted with setting the visit up. Visit began at 12:30 and ended at 12:47. Patient is at work.  I am in the office.   Patient experiencing resumption of vaginal bleeding since stopping her LoLoEstrin last year.   [place] was 29 after coming off for 2 weeks.   Menses have been: 12/31 - 1/2 - light. 1/17 - 1/20 - light. 3/8 - 3/12 - heavier bleeding and cramping.  3/29 - 3/31 - light.   No hot flashes or night sweats.   Denies HTN, migraine HA with aura, smoking, breast or liver disease, thromboembolic event of self or family.   GYNECOLOGIC HISTORY: No LMP recorded. (Menstrual status: Other). Contraception:  OCP Menopausal hormone therapy:  none Last mammogram:  01-15-18 BIRADS 1 negative/density b Last pap smear:   04-29-16 neg HPV HR neg        OB History    Gravida  0   Para  0   Term  0   Preterm  0   AB  0   Living  0     SAB  0   TAB  0   Ectopic  0   Multiple  0   Live Births                 Patient Active Problem List   Diagnosis Date Noted  . Hypoglycemia 08/28/2018  . Lateral epicondylitis of right elbow 05/17/2016  . Benign paroxysmal positional vertigo 08/25/2014  . Preventative health care 08/22/2011  . Hyperlipidemia     Past Medical History:  Diagnosis Date  . Benign paroxysmal positional vertigo 08/25/2014  . Cerumen impaction 06/22/2012  . Dysmenorrhea   . Fibroid   . Hyperlipidemia   . Menorrhagia   . Pain of right heel 07/05/2013  . Plantar fasciitis of right foot 07/05/2013  . Preventative health care 08/22/2011  . Tinea pedis 12/02/2011    Past Surgical History:  Procedure Laterality Date  . 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  fibroidectomy    . hysteroscopic resection  5/04    Current Outpatient Medications  Medication Sig Dispense Refill  . meclizine (ANTIVERT) 12.5 MG tablet Take 1 tablet (12.5 mg total) by mouth 3 (three) times daily as needed for dizziness. 30 tablet 0  . meloxicam (MOBIC) 15 MG tablet TAKE 1 TABLET BY MOUTH EVERY DAY AS NEEDED FOR PAIN 30 tablet 3  . Multiple Vitamin (MULTIVITAMIN) tablet Take 1 tablet by mouth as needed.     . NON FORMULARY daily. Omega red      No current facility-administered medications for this visit.      ALLERGIES: Patient has no known allergies.  Family History  Problem Relation Age of Onset  . Diabetes Mother        type 2  . Heart attack Mother 45  . Hypertension Mother   . Hyperlipidemia Mother   . Stroke Mother        ministrokes  . Dementia Mother   . Heart disease Mother        MI 2005, s/p 3 stents  . Congestive Heart Failure Mother   . Kidney disease  Mother   . Heart attack Maternal Grandmother 67  . Diabetes Maternal Grandmother        type 2  . Hypertension Maternal Grandmother   . Hyperlipidemia Maternal Grandmother   . Stroke Maternal Grandfather   . Heart attack Paternal Grandfather   . Heart disease Paternal Grandfather        MI?  Marland Kitchen Hyperlipidemia Sister   . Diabetes Brother        type 2  . Pancreatitis Brother   . Alcohol abuse Brother   . Mental illness Brother   . Hyperlipidemia Brother   . Heart disease Brother        w/CAD s/p 1 stent  . Diabetes Brother        type 2  . Heart disease Father   . Pancreatitis Sister     Social History   Socioeconomic History  . Marital status: Married    Spouse name: Not on file  . Number of children: Not on file  . Years of education: Not on file  . Highest education level: Not on file  Occupational History  . Not on file  Social Needs  . Financial resource strain: Not on file  . Food insecurity:    Worry: Not on file    Inability: Not on file  . Transportation needs:     Medical: Not on file    Non-medical: Not on file  Tobacco Use  . Smoking status: Never Smoker  . Smokeless tobacco: Never Used  Substance and Sexual Activity  . Alcohol use: No  . Drug use: No  . Sexual activity: Yes    Partners: Male    Birth control/protection: Pill  Lifestyle  . Physical activity:    Days per week: Not on file    Minutes per session: Not on file  . Stress: Not on file  Relationships  . Social connections:    Talks on phone: Not on file    Gets together: Not on file    Attends religious service: Not on file    Active member of club or organization: Not on file    Attends meetings of clubs or organizations: Not on file    Relationship status: Not on file  . Intimate partner violence:    Fear of current or ex partner: Not on file    Emotionally abused: Not on file    Physically abused: Not on file    Forced sexual activity: Not on file  Other Topics Concern  . Not on file  Social History Narrative   No major dietary restrictions   Walks the dogs, 2    Works for the [employer]      Lives with husband    Review of Systems  PHYSICAL EXAMINATION:    There were no vitals taken for this visit.      NA.  ASSESSMENT  Perimenopausal female.  No contraindications to use of COCs.   PLAN  We discussed options for care.  She will resume use of LoLoEstrin for contraception and control of menses.  Refills until she is due for her annual exam.  HRT not indicated at this time.  She will call if her cycles do not regulate.   An After Visit Summary was printed and given to the patient.  _17___ minutes consultation.

## 2018-10-14 ENCOUNTER — Encounter: Payer: Self-pay | Admitting: Obstetrics and Gynecology

## 2019-01-21 LAB — HM MAMMOGRAPHY

## 2019-01-22 ENCOUNTER — Other Ambulatory Visit: Payer: Self-pay | Admitting: Family Medicine

## 2019-05-17 ENCOUNTER — Other Ambulatory Visit (HOSPITAL_COMMUNITY)
Admission: RE | Admit: 2019-05-17 | Discharge: 2019-05-17 | Disposition: A | Discharge status: Home / Self Care | Payer: 59 | Source: Ambulatory Visit | Attending: Certified Nurse Midwife | Admitting: Certified Nurse Midwife

## 2019-05-17 ENCOUNTER — Other Ambulatory Visit: Payer: Self-pay

## 2019-05-17 ENCOUNTER — Encounter: Payer: Self-pay | Admitting: Certified Nurse Midwife

## 2019-05-17 ENCOUNTER — Telehealth: Payer: Self-pay | Admitting: Certified Nurse Midwife

## 2019-05-17 ENCOUNTER — Ambulatory Visit (INDEPENDENT_AMBULATORY_CARE_PROVIDER_SITE_OTHER): Payer: 59 | Admitting: Certified Nurse Midwife

## 2019-05-17 VITALS — BP 116/70 | HR 68 | Temp 97.1°F | Resp 16 | Ht 65.25 in | Wt 153.0 lb

## 2019-05-17 DIAGNOSIS — Z01419 Encounter for gynecological examination (general) (routine) without abnormal findings: Secondary | ICD-10-CM | POA: Diagnosis not present

## 2019-05-17 DIAGNOSIS — Z124 Encounter for screening for malignant neoplasm of cervix: Secondary | ICD-10-CM

## 2019-05-17 DIAGNOSIS — N951 Menopausal and female climacteric states: Secondary | ICD-10-CM

## 2019-05-17 NOTE — Progress Notes (Signed)
53 y.o. G0P0000 Married  Caucasian Fe here for annual exam. Perimenopausal and had continued with OCP use over the past year due to perimenopausal symptoms and concern with becoming pregnant.. Denies warning signs with use. No period in past year. No hot flashes or night sweats. Sees PCP Dr Charlett Blake for labs, aex, cholesterol management. All stable per patient. No other health issues today.  No LMP recorded. (Menstrual status: Oral contraceptives).          Sexually active: Yes.    The current method of family planning is OCP (estrogen/progesterone).    Exercising: Yes.    walking Smoker:  no  Review of Systems  Constitutional: Negative.   HENT: Negative.   Eyes: Negative.   Respiratory: Negative.   Cardiovascular: Negative.   Gastrointestinal: Negative.   Genitourinary: Negative.   Musculoskeletal: Negative.   Skin: Negative.   Neurological: Negative.   Endo/Heme/Allergies: Negative.   Psychiatric/Behavioral: Negative.     Health Maintenance: Pap:  04-29-16 neg HPV HR neg History of Abnormal Pap: no MMG:  01-21-2019 category b density birads 1:neg Self Breast exams: no Colonoscopy: cologuard neg 2018 BMD:   none TDaP:  2013 Shingles: no Pneumonia: no Hep C and HIV: not done Labs: if needed   reports that she has never smoked. She has never used smokeless tobacco. She reports that she does not drink alcohol or use drugs.  Past Medical History:  Diagnosis Date  . Benign paroxysmal positional vertigo 08/25/2014  . Cerumen impaction 06/22/2012  . Dysmenorrhea   . Fibroid   . Hyperlipidemia   . Menorrhagia   . Pain of right heel 07/05/2013  . Plantar fasciitis of right foot 07/05/2013  . Preventative health care 08/22/2011  . Tinea pedis 12/02/2011    Past Surgical History:  Procedure Laterality Date  . 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 fibroidectomy    . hysteroscopic resection  5/04    Current Outpatient Medications  Medication  Sig Dispense Refill  . Ascorbic Acid (VITAMIN C PO) Take by mouth.    . meloxicam (MOBIC) 15 MG tablet TAKE 1 TABLET BY MOUTH EVERY DAY AS NEEDED FOR PAIN 30 tablet 3  . NON FORMULARY daily. Omega red     . Norethindrone-Ethinyl Estradiol-Fe Biphas (LO LOESTRIN FE) 1 MG-10 MCG / 10 MCG tablet Take 1 tablet by mouth daily. 3 Package 2   No current facility-administered medications for this visit.     Family History  Problem Relation Age of Onset  . Diabetes Mother        type 2  . Heart attack Mother 51  . Hypertension Mother   . Hyperlipidemia Mother   . Stroke Mother        ministrokes  . Dementia Mother   . Heart disease Mother        MI 2005, s/p 3 stents  . Congestive Heart Failure Mother   . Kidney disease Mother   . Heart attack Maternal Grandmother 67  . Diabetes Maternal Grandmother        type 2  . Hypertension Maternal Grandmother   . Hyperlipidemia Maternal Grandmother   . Stroke Maternal Grandfather   . Heart attack Paternal Grandfather   . Heart disease Paternal Grandfather        MI?  Marland Kitchen Hyperlipidemia Sister   . Diabetes Brother        type 2  . Pancreatitis Brother   . Alcohol abuse  Brother   . Mental illness Brother   . Hyperlipidemia Brother   . Heart disease Brother        w/CAD s/p 1 stent  . Diabetes Brother        type 2  . Heart disease Father   . Pancreatitis Sister     ROS:  Pertinent items are noted in HPI.  Otherwise, a comprehensive ROS was negative.  Exam:   BP 116/70   Pulse 68   Temp (!) 97.1 F (36.2 C) (Skin)   Resp 16   Ht 5' 5.25" (1.657 m)   Wt 153 lb (69.4 kg)   BMI 25.27 kg/m  Height: 5' 5.25" (165.7 cm) Ht Readings from Last 3 Encounters:  05/17/19 5' 5.25" (1.657 m)  09/14/18 5\' 5"  (1.651 m)  08/28/18 5\' 5"  (1.651 m)    General appearance: alert, cooperative and appears stated age Head: Normocephalic, without obvious abnormality, atraumatic Neck: no adenopathy, supple, symmetrical, trachea midline and thyroid  normal to inspection and palpation Lungs: clear to auscultation bilaterally Breasts: normal appearance, no masses or tenderness, No nipple retraction or dimpling, No nipple discharge or bleeding, No axillary or supraclavicular adenopathy Heart: regular rate and rhythm Abdomen: soft, non-tender; no masses,  no organomegaly Extremities: extremities normal, atraumatic, no cyanosis or edema Skin: Skin color, texture, turgor normal. No rashes or lesions Lymph nodes: Cervical, supraclavicular, and axillary nodes normal. No abnormal inguinal nodes palpated Neurologic: Grossly normal   Pelvic: External genitalia:  no lesions              Urethra:  normal appearing urethra with no masses, tenderness or lesions              Bartholin's and Skene's: normal                 Vagina: normal appearing vagina with normal color and discharge, no lesions              Cervix: no cervical motion tenderness, no lesions and nulliparous appearance              Pap taken: No. Bimanual Exam:  Uterus:  normal size, contour, position, consistency, mobility, non-tender and anteverted              Adnexa: normal adnexa and no mass, fullness, tenderness               Rectovaginal: Confirms               Anus:  normal sphincter tone, no lesions  Chaperone present: yes  A:  Well Woman with normal exam  Perimenopausal ? Menopausal on OCP for contraception  Cholesterol management/labs with PCP  P:   Reviewed health and wellness pertinent to exam  Discussed will need to come off OCP and if menopausal can start on HRT if she desires. Recommend lab [place], TSH, Prolactin and AMH after she has been off OCP's for one week. Patient will call so labs can be scheduled.  Pap smear: yes  counseled on breast self exam, mammography screening, feminine hygiene, use and side effects of OCP's, use and side effects of HRT  return annually or prn  An After Visit Summary was printed and given to the patient.

## 2019-05-17 NOTE — Telephone Encounter (Signed)
Patient will take her last pill on this Wednesday and says she need to schedule hormone level labs.

## 2019-05-18 ENCOUNTER — Other Ambulatory Visit: Payer: Self-pay | Admitting: Family Medicine

## 2019-05-20 NOTE — Telephone Encounter (Signed)
Spoke with patient. She will take her last OCP on 05/22/19. Per review of 05/17/19 AEX notes, patient will stop OCP for 1 wk and return for [facility], TSH, Prolactin, and AMH labs.   Lab appt scheduled for 11/19 at 9:45am.   Future lab orders placed.   Routing to provider for final review. Patient is agreeable to disposition. Will close encounter.

## 2019-05-20 NOTE — Telephone Encounter (Signed)
agree

## 2019-05-22 LAB — CYTOLOGY - PAP: Diagnosis: NEGATIVE

## 2019-05-23 ENCOUNTER — Other Ambulatory Visit: Payer: Self-pay

## 2019-05-23 ENCOUNTER — Other Ambulatory Visit (INDEPENDENT_AMBULATORY_CARE_PROVIDER_SITE_OTHER): Payer: 59

## 2019-05-23 ENCOUNTER — Telehealth: Payer: Self-pay | Admitting: *Deleted

## 2019-05-23 DIAGNOSIS — N951 Menopausal and female climacteric states: Secondary | ICD-10-CM

## 2019-05-23 NOTE — Telephone Encounter (Signed)
Encounter closed

## 2019-05-23 NOTE — Telephone Encounter (Signed)
Thank you for scheduling for 05/30/19.

## 2019-05-23 NOTE — Telephone Encounter (Signed)
Spoke with patient while in office. Patient was scheduled for a lab appt on 05/30/19 for [place], TSH, Prolactin and AMH. Patient took her last OCP (LoLoestrin)  on 05/22/19. Patient came into office today and requested labs be drawn today, states she discussed drawing the labs after completing week of inactive pills with Melvia Heaps, CNM.  Reviewed 05/17/19 telephone encounter and recommendations with patient.   Reviewed with Dr. Quincy Simmonds, reviewed recent literature in Up to Date. For accuracy, should be drawn 2-4wks after stopping OCP.    Reviewed with patient, patient verbalizes understanding.  Lab appt rescheduled for 05/30/19 at 9:45pm, future lab orders placed.   Routing to S. Orvan Seen, RN  Cc: Melvia Heaps, CNM, Dr. Quincy Simmonds

## 2019-05-30 ENCOUNTER — Other Ambulatory Visit (INDEPENDENT_AMBULATORY_CARE_PROVIDER_SITE_OTHER): Payer: 59

## 2019-05-30 ENCOUNTER — Other Ambulatory Visit: Payer: Self-pay

## 2019-05-30 DIAGNOSIS — N951 Menopausal and female climacteric states: Secondary | ICD-10-CM

## 2019-06-04 ENCOUNTER — Telehealth: Payer: Self-pay

## 2019-06-04 LAB — ANTI MULLERIAN HORMONE: ANTI-MULLERIAN HORMONE (AMH): <0.015 ng/mL

## 2019-06-04 LAB — PROLACTIN: Prolactin: 19.2 ng/mL (ref 4.8–23.3)

## 2019-06-04 LAB — TSH: TSH: 1 u[IU]/mL (ref 0.450–4.500)

## 2019-06-04 LAB — FOLLICLE STIMULATING HORMONE: FSH: 51.1 m[IU]/mL

## 2019-06-04 NOTE — Telephone Encounter (Signed)
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 She does not need to restart her OCP. She does however need to report any vaginal bleeding due to postmenopausal now.

## 2019-06-04 NOTE — Telephone Encounter (Signed)
Left message for call back.

## 2019-06-05 NOTE — Telephone Encounter (Signed)
Patient returning call to Joy.  

## 2019-06-05 NOTE — Telephone Encounter (Signed)
Patient notified of results as written by provider 

## 2019-06-05 NOTE — Telephone Encounter (Signed)
Patient notified of results. See lab 

## 2019-06-05 NOTE — Telephone Encounter (Signed)
Left message for call back.

## 2019-06-28 ENCOUNTER — Other Ambulatory Visit: Payer: Self-pay | Admitting: Obstetrics and Gynecology

## 2019-09-02 ENCOUNTER — Ambulatory Visit (INDEPENDENT_AMBULATORY_CARE_PROVIDER_SITE_OTHER): Payer: 59 | Admitting: Family Medicine

## 2019-09-02 ENCOUNTER — Encounter: Payer: Self-pay | Admitting: Family Medicine

## 2019-09-02 ENCOUNTER — Other Ambulatory Visit: Payer: Self-pay

## 2019-09-02 VITALS — BP 112/76 | HR 74 | Temp 97.3°F | Ht 65.5 in | Wt 156.0 lb

## 2019-09-02 DIAGNOSIS — F4321 Adjustment disorder with depressed mood: Secondary | ICD-10-CM | POA: Diagnosis not present

## 2019-09-02 DIAGNOSIS — E782 Mixed hyperlipidemia: Secondary | ICD-10-CM

## 2019-09-02 DIAGNOSIS — Z0000 Encounter for general adult medical examination without abnormal findings: Secondary | ICD-10-CM | POA: Diagnosis not present

## 2019-09-02 DIAGNOSIS — E162 Hypoglycemia, unspecified: Secondary | ICD-10-CM

## 2019-09-02 NOTE — Assessment & Plan Note (Signed)
Patient encouraged to maintain heart healthy diet, regular exercise, adequate sleep. Consider daily probiotics. Take medications as prescribed 

## 2019-09-02 NOTE — Patient Instructions (Addendum)
Try topical aspirin or diclofenac, aspercreme or voltaren make these Then apply lidocaine gel and aspercreme, voltaren, icy hot the companies make this  [employer]ron Blood Pressure cuff, upper arm, want BP 100-140/60-90 Pulse oximeter, want oxygen in 90s  Weekly vitals  Take Multivitamin with minerals, selenium Vitamin D 1000-2000 IU daily Probiotic with lactobacillus and bifidophilus Asprin EC 81 mg daily  Melatonin 2-5 mg at bedtime  [email or web address]   Preventive Care 30-55 Years Old, Female Preventive care refers to visits with your health care provider and lifestyle choices that can promote health and wellness. This includes:  A yearly physical exam. This may also be called an annual well check.  Regular dental visits and eye exams.  Immunizations.  Screening for certain conditions.  Healthy lifestyle choices, such as eating a healthy diet, getting regular exercise, not using drugs or products that contain nicotine and tobacco, and limiting alcohol use. What can I expect for my preventive care visit? Physical exam Your health care provider will check your:  Height and weight. This may be used to calculate body mass index (BMI), which tells if you are at a healthy weight.  Heart rate and blood pressure.  Skin for abnormal spots. Counseling Your health care provider may ask you questions about your:  Alcohol, tobacco, and drug use.  Emotional well-being.  Home and relationship well-being.  Sexual activity.  Eating habits.  Work and work Statistician.  Method of birth control.  Menstrual cycle.  Pregnancy history. What immunizations do I need?  Influenza (flu) vaccine  This is recommended every year. Tetanus, diphtheria, and pertussis (Tdap) vaccine  You may need a Td booster every 10 years. Varicella (chickenpox) vaccine  You may need this if you have not been vaccinated. Zoster (shingles) vaccine  You may need  this after age 93. Measles, mumps, and rubella (MMR) vaccine  You may need at least one dose of MMR if you were born in 1957 or later. You may also need a second dose. Pneumococcal conjugate (PCV13) vaccine  You may need this if you have certain conditions and were not previously vaccinated. Pneumococcal polysaccharide (PPSV23) vaccine  You may need one or two doses if you smoke cigarettes or if you have certain conditions. Meningococcal conjugate (MenACWY) vaccine  You may need this if you have certain conditions. Hepatitis A vaccine  You may need this if you have certain conditions or if you travel or work in places where you may be exposed to hepatitis A. Hepatitis B vaccine  You may need this if you have certain conditions or if you travel or work in places where you may be exposed to hepatitis B. Haemophilus influenzae type b (Hib) vaccine  You may need this if you have certain conditions. Human papillomavirus (HPV) vaccine  If recommended by your health care provider, you may need three doses over 6 months. You may receive vaccines as individual doses or as more than one vaccine together in one shot (combination vaccines). Talk with your health care provider about the risks and benefits of combination vaccines. What tests do I need? Blood tests  Lipid and cholesterol levels. These may be checked every 5 years, or more frequently if you are over 80 years old.  Hepatitis C test.  Hepatitis B test. Screening  Lung cancer screening. You may have this screening every year starting at age 21 if you have a 30-pack-year history of smoking and currently smoke or have quit within the past 15 years.  Colorectal cancer  screening. All adults should have this screening starting at age 29 and continuing until age 74. Your health care provider may recommend screening at age 34 if you are at increased risk. You will have tests every 1-10 years, depending on your results and the type of  screening test.  Diabetes screening. This is done by checking your blood sugar (glucose) after you have not eaten for a while (fasting). You may have this done every 1-3 years.  Mammogram. This may be done every 1-2 years. Talk with your health care provider about when you should start having regular mammograms. This may depend on whether you have a family history of breast cancer.  BRCA-related cancer screening. This may be done if you have a family history of breast, ovarian, tubal, or peritoneal cancers.  Pelvic exam and Pap test. This may be done every 3 years starting at age 47. Starting at age 3, this may be done every 5 years if you have a Pap test in combination with an HPV test. Other tests  Sexually transmitted disease (STD) testing.  Bone density scan. This is done to screen for osteoporosis. You may have this scan if you are at high risk for osteoporosis. Follow these instructions at home: Eating and drinking  Eat a diet that includes fresh fruits and vegetables, whole grains, lean protein, and low-fat dairy.  Take vitamin and mineral supplements as recommended by your health care provider.  Do not drink alcohol if: ? Your health care provider tells you not to drink. ? You are pregnant, may be pregnant, or are planning to become pregnant.  If you drink alcohol: ? Limit how much you have to 0-1 drink a day. ? Be aware of how much alcohol is in your drink. In the [place] one drink equals one 12 oz bottle of beer (355 mL), one 5 oz glass of wine (148 mL), or one 1 oz glass of hard liquor (44 mL). Lifestyle  Take daily care of your teeth and gums.  Stay active. Exercise for at least 30 minutes on 5 or more days each week.  Do not use any products that contain nicotine or tobacco, such as cigarettes, e-cigarettes, and chewing tobacco. If you need help quitting, ask your health care provider.  If you are sexually active, practice safe sex. Use a condom or other form of birth  control (contraception) in order to prevent pregnancy and STIs (sexually transmitted infections).  If told by your health care provider, take low-dose aspirin daily starting at age 21. What's next?  Visit your health care provider once a year for a well check visit.  Ask your health care provider how often you should have your eyes and teeth checked.  Stay up to date on all vaccines. This information is not intended to replace advice given to you by your health care provider. Make sure you discuss any questions you have with your health care provider. Document Revised: 03/08/2018 Document Reviewed: 03/08/2018 Elsevier Patient Education  2020 [employer].

## 2019-09-03 LAB — LIPID PANEL
Cholesterol: 228 mg/dL — ABNORMAL HIGH (ref 0–200)
HDL: 59 mg/dL (ref >39.00)
LDL Cholesterol: 144 mg/dL — ABNORMAL HIGH (ref 0–99)
NonHDL: 169.2
Total CHOL/HDL Ratio: 4
Triglycerides: 126 mg/dL (ref 0.0–149.0)
VLDL: 25.2 mg/dL (ref 0.0–40.0)

## 2019-09-03 LAB — CBC
HCT: 41.8 % (ref 36.0–46.0)
Hemoglobin: 14 g/dL (ref 12.0–15.0)
MCHC: 33.4 g/dL (ref 30.0–36.0)
MCV: 92 fl (ref 78.0–100.0)
Platelets: 278 10*3/uL (ref 150.0–400.0)
RBC: 4.54 Mil/uL (ref 3.87–5.11)
RDW: 12.9 % (ref 11.5–15.5)
WBC: 7.6 10*3/uL (ref 4.0–10.5)

## 2019-09-03 LAB — COMPREHENSIVE METABOLIC PANEL
ALT: 45 U/L — ABNORMAL HIGH (ref 0–35)
AST: 31 U/L (ref 0–37)
Albumin: 4.6 g/dL (ref 3.5–5.2)
Alkaline Phosphatase: 108 U/L (ref 39–117)
BUN: 19 mg/dL (ref 6–23)
CO2: 30 mEq/L (ref 19–32)
Calcium: 10 mg/dL (ref 8.4–10.5)
Chloride: 104 mEq/L (ref 96–112)
Creatinine, Ser: 0.68 mg/dL (ref 0.40–1.20)
GFR: 90.27 mL/min (ref >60.00)
Glucose, Bld: 89 mg/dL (ref 70–99)
Potassium: 4.3 mEq/L (ref 3.5–5.1)
Sodium: 141 mEq/L (ref 135–145)
Total Bilirubin: 0.5 mg/dL (ref 0.2–1.2)
Total Protein: 7.2 g/dL (ref 6.0–8.3)

## 2019-09-03 LAB — TSH: TSH: 0.7 u[IU]/mL (ref 0.35–4.50)

## 2019-09-03 LAB — HEMOGLOBIN A1C: Hgb A1c MFr Bld: 5.8 % (ref 4.6–6.5)

## 2019-09-04 DIAGNOSIS — F4321 Adjustment disorder with depressed mood: Secondary | ICD-10-CM | POA: Insufficient documentation

## 2019-09-04 DIAGNOSIS — M549 Dorsalgia, unspecified: Secondary | ICD-10-CM | POA: Insufficient documentation

## 2019-09-04 NOTE — Progress Notes (Addendum)
Subjective:    Patient ID: Jasmin Taylor, female    DOB: [birth year], 54 y.o.   MRN: [id number]  Chief Complaint  Patient presents with  . Annual Exam  . Foot Pain    left    HPI Patient is in today for annual preventative exam. She feels well today. She is tearful as she lost her sister suddenly when she was murdered by her husband as she was trying to leave him. She notes anhedonia and fatigue but does not endorse suicidal ideation and she notes she has a strong support system. She is maintaining quarantine and tries to stay active. Denies CP/palp/SOB/HA/congestion/fevers/GI or GU c/o. Taking meds as prescribed  Past Medical History:  Diagnosis Date  . Benign paroxysmal positional vertigo 08/25/2014  . Cerumen impaction 06/22/2012  . Dysmenorrhea   . Fibroid   . Hyperlipidemia   . Menorrhagia   . Pain of right heel 07/05/2013  . Plantar fasciitis of right foot 07/05/2013  . Preventative health care 08/22/2011  . Tinea pedis 12/02/2011    Past Surgical History:  Procedure Laterality Date  . 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 fibroidectomy    . hysteroscopic resection  5/04    Family History  Problem Relation Age of Onset  . Diabetes Mother        type 2  . Heart attack Mother 66  . Hypertension Mother   . Hyperlipidemia Mother   . Stroke Mother        ministrokes  . Dementia Mother   . Heart disease Mother        MI 2005, s/p 3 stents  . Congestive Heart Failure Mother   . Kidney disease Mother   . Heart attack Maternal Grandmother 67  . Diabetes Maternal Grandmother        type 2  . Hypertension Maternal Grandmother   . Hyperlipidemia Maternal Grandmother   . Stroke Maternal Grandfather   . Heart attack Paternal Grandfather   . Heart disease Paternal Grandfather        MI?  Marland Kitchen Hyperlipidemia Sister   . Diabetes Brother        type 2  . Pancreatitis Brother   . Alcohol abuse Brother   . Mental illness Brother   .  Hyperlipidemia Brother   . Heart disease Brother        w/CAD s/p 1 stent  . Diabetes Brother        type 2  . Heart disease Father   . Pancreatitis Sister     Social History   Socioeconomic History  . Marital status: Married    Spouse name: Not on file  . Number of children: Not on file  . Years of education: Not on file  . Highest education level: Not on file  Occupational History  . Not on file  Tobacco Use  . Smoking status: Never Smoker  . Smokeless tobacco: Never Used  Substance and Sexual Activity  . Alcohol use: No  . Drug use: No  . Sexual activity: Yes    Partners: Male    Birth control/protection: OCP  Other Topics Concern  . Not on file  Social History Narrative   No major dietary restrictions   Walks the dogs, 2    Works for the [place] [employer]      Lives with husband   Social Determinants of Radio broadcast assistant Strain:   .  Difficulty of Paying Living Expenses: Not on file  Food Insecurity:   . Worried About Charity fundraiser in the Last Year: Not on file  . Ran Out of Food in the Last Year: Not on file  Transportation Needs:   . Lack of Transportation (Medical): Not on file  . Lack of Transportation (Non-Medical): Not on file  Physical Activity:   . Days of Exercise per Week: Not on file  . Minutes of Exercise per Session: Not on file  Stress:   . Feeling of Stress : Not on file  Social Connections:   . Frequency of Communication with Friends and Family: Not on file  . Frequency of Social Gatherings with Friends and Family: Not on file  . Attends Religious Services: Not on file  . Active Member of Clubs or Organizations: Not on file  . Attends Archivist Meetings: Not on file  . Marital Status: Not on file  Intimate Partner Violence:   . Fear of Current or Ex-Partner: Not on file  . Emotionally Abused: Not on file  . Physically Abused: Not on file  . Sexually Abused: Not on file    Outpatient Medications Prior to  Visit  Medication Sig Dispense Refill  . Ascorbic Acid (VITAMIN C PO) Take by mouth.    . meloxicam (MOBIC) 15 MG tablet TAKE 1 TABLET BY MOUTH EVERY DAY AS NEEDED FOR PAIN 30 tablet 3  . NON FORMULARY daily. Omega red     . Norethindrone-Ethinyl Estradiol-Fe Biphas (LO LOESTRIN FE) 1 MG-10 MCG / 10 MCG tablet Take 1 tablet by mouth daily. 3 Package 2   No facility-administered medications prior to visit.    No Known Allergies  Review of Systems  Constitutional: Positive for malaise/fatigue. Negative for chills and fever.  HENT: Negative for congestion and hearing loss.   Eyes: Negative for discharge.  Respiratory: Negative for cough, sputum production and shortness of breath.   Cardiovascular: Negative for chest pain, palpitations and leg swelling.  Gastrointestinal: Negative for abdominal pain, blood in stool, constipation, diarrhea, heartburn, nausea and vomiting.  Genitourinary: Positive for frequency. Negative for dysuria, hematuria and urgency.  Musculoskeletal: Negative for back pain, falls and myalgias.  Skin: Negative for rash.  Neurological: Negative for dizziness, sensory change, loss of consciousness, weakness and headaches.  Endo/Heme/Allergies: Negative for environmental allergies. Does not bruise/bleed easily.  Psychiatric/Behavioral: Positive for depression. Negative for suicidal ideas. The patient is nervous/anxious. The patient does not have insomnia.        Objective:    Physical Exam Constitutional:      General: She is not in acute distress.    Appearance: She is not diaphoretic.  HENT:     Head: Normocephalic and atraumatic.     Right Ear: External ear normal.     Left Ear: External ear normal.     Nose: Nose normal.     Mouth/Throat:     Pharynx: No oropharyngeal exudate.  Eyes:     General: No scleral icterus.       Right eye: No discharge.        Left eye: No discharge.     Conjunctiva/sclera: Conjunctivae normal.     Pupils: Pupils are equal,  round, and reactive to light.  Neck:     Thyroid: No thyromegaly.  Cardiovascular:     Rate and Rhythm: Normal rate and regular rhythm.     Heart sounds: Normal heart sounds. No murmur.  Pulmonary:  Effort: Pulmonary effort is normal. No respiratory distress.     Breath sounds: Normal breath sounds. No wheezing or rales.  Abdominal:     General: Bowel sounds are normal. There is no distension.     Palpations: Abdomen is soft. There is no mass.     Tenderness: There is no abdominal tenderness.  Musculoskeletal:        General: No tenderness. Normal range of motion.     Cervical back: Normal range of motion and neck supple.  Lymphadenopathy:     Cervical: No cervical adenopathy.  Skin:    General: Skin is warm and dry.     Findings: No rash.  Neurological:     Mental Status: She is alert and oriented to person, place, and time.     Cranial Nerves: No cranial nerve deficit.     Coordination: Coordination normal.     Deep Tendon Reflexes: Reflexes are normal and symmetric. Reflexes normal.     BP 112/76 (BP Location: Left Arm, Patient Position: Sitting, Cuff Size: Normal)   Pulse 74   Temp (!) 97.3 F (36.3 C) (Temporal)   Ht 5' 5.5" (1.664 m)   Wt 156 lb (70.8 kg)   SpO2 99%   BMI 25.56 kg/m  Wt Readings from Last 3 Encounters:  09/02/19 156 lb (70.8 kg)  05/17/19 153 lb (69.4 kg)  09/14/18 152 lb 12.8 oz (69.3 kg)    Diabetic Foot Exam - Simple   No data filed     Lab Results  Component Value Date   WBC 7.6 09/02/2019   HGB 14.0 09/02/2019   HCT 41.8 09/02/2019   PLT 278.0 09/02/2019   GLUCOSE 89 09/02/2019   CHOL 228 (H) 09/02/2019   TRIG 126.0 09/02/2019   HDL 59.00 09/02/2019   LDLCALC 144 (H) 09/02/2019   ALT 45 (H) 09/02/2019   AST 31 09/02/2019   NA 141 09/02/2019   K 4.3 09/02/2019   CL 104 09/02/2019   CREATININE 0.68 09/02/2019   BUN 19 09/02/2019   CO2 30 09/02/2019   TSH 0.70 09/02/2019   HGBA1C 5.8 09/02/2019    Lab Results    Component Value Date   TSH 0.70 09/02/2019   Lab Results  Component Value Date   WBC 7.6 09/02/2019   HGB 14.0 09/02/2019   HCT 41.8 09/02/2019   MCV 92.0 09/02/2019   PLT 278.0 09/02/2019   Lab Results  Component Value Date   NA 141 09/02/2019   K 4.3 09/02/2019   CO2 30 09/02/2019   GLUCOSE 89 09/02/2019   BUN 19 09/02/2019   CREATININE 0.68 09/02/2019   BILITOT 0.5 09/02/2019   ALKPHOS 108 09/02/2019   AST 31 09/02/2019   ALT 45 (H) 09/02/2019   PROT 7.2 09/02/2019   ALBUMIN 4.6 09/02/2019   CALCIUM 10.0 09/02/2019   GFR 90.27 09/02/2019   Lab Results  Component Value Date   CHOL 228 (H) 09/02/2019   Lab Results  Component Value Date   HDL 59.00 09/02/2019   Lab Results  Component Value Date   LDLCALC 144 (H) 09/02/2019   Lab Results  Component Value Date   TRIG 126.0 09/02/2019   Lab Results  Component Value Date   CHOLHDL 4 09/02/2019   Lab Results  Component Value Date   HGBA1C 5.8 09/02/2019       Assessment & Plan:   Problem List Items Addressed This Visit    Hyperlipidemia - Primary    Encouraged heart  healthy diet, increase exercise, avoid trans fats, consider a krill oil cap daily      Relevant Orders   Hemoglobin A1c (Completed)   Comprehensive metabolic panel (Completed)   Lipid panel (Completed)   Preventative health care    Patient encouraged to maintain heart healthy diet, regular exercise, adequate sleep. Consider daily probiotics. Take medications as prescribed.      Relevant Orders   CBC (Completed)   TSH (Completed)   Hypoglycemia   Relevant Orders   Hemoglobin A1c (Completed)   Comprehensive metabolic panel (Completed)   Grief    She is struggling with the sudden loss of her sister who was murdered by her own husband of 34 years as she was trying to leave him. She feels she has a good support system and she does not need any medication or counseling at this time.          I have discontinued Curt Bears L. Mcdonagh's  Norethindrone-Ethinyl Estradiol-Fe Biphas. I am also having her maintain her NON FORMULARY, Ascorbic Acid (VITAMIN C PO), and meloxicam.  No orders of the defined types were placed in this encounter.    Penni Homans, MD

## 2019-09-04 NOTE — Assessment & Plan Note (Deleted)
She underwent fusion at L5-S1 with Dr Saintclair Halsted and her pain has largely resolved.

## 2019-09-04 NOTE — Assessment & Plan Note (Signed)
She is struggling with the sudden loss of her sister who was murdered by her own husband of 20 years as she was trying to leave him. She feels she has a good support system and she does not need any medication or counseling at this time.

## 2019-09-04 NOTE — Assessment & Plan Note (Signed)
Encouraged heart healthy diet, increase exercise, avoid trans fats, consider a krill oil cap daily 

## 2019-09-15 ENCOUNTER — Other Ambulatory Visit: Payer: Self-pay | Admitting: Family Medicine

## 2019-09-30 ENCOUNTER — Encounter: Payer: Self-pay | Admitting: Certified Nurse Midwife

## 2019-10-28 ENCOUNTER — Other Ambulatory Visit: Payer: Self-pay

## 2019-10-31 ENCOUNTER — Other Ambulatory Visit: Payer: Self-pay

## 2019-10-31 ENCOUNTER — Ambulatory Visit (INDEPENDENT_AMBULATORY_CARE_PROVIDER_SITE_OTHER): Payer: 59 | Admitting: Family Medicine

## 2019-10-31 VITALS — BP 133/78 | HR 77 | Temp 98.0°F | Resp 12 | Ht 66.0 in | Wt 154.8 lb

## 2019-10-31 DIAGNOSIS — R079 Chest pain, unspecified: Secondary | ICD-10-CM | POA: Diagnosis not present

## 2019-10-31 DIAGNOSIS — E782 Mixed hyperlipidemia: Secondary | ICD-10-CM | POA: Diagnosis not present

## 2019-10-31 DIAGNOSIS — R0602 Shortness of breath: Secondary | ICD-10-CM | POA: Diagnosis not present

## 2019-11-03 DIAGNOSIS — R0602 Shortness of breath: Secondary | ICD-10-CM | POA: Insufficient documentation

## 2019-11-03 DIAGNOSIS — R079 Chest pain, unspecified: Secondary | ICD-10-CM | POA: Insufficient documentation

## 2019-11-03 NOTE — Assessment & Plan Note (Signed)
Chest pains are atypical and occur in no particular pattern and with no associated symptoms. EKG if negative. Patient does acknowledge stress may be a factor but she is referred to cardiology for cardiac testing to confirm she is not experiencing cardiac causes of her pain. She will seek care if pain recurs and doe not resolve.

## 2019-11-03 NOTE — Assessment & Plan Note (Signed)
Encouraged heart healthy diet, increase exercise, avoid trans fats, consider a krill oil cap daily 

## 2019-11-03 NOTE — Progress Notes (Signed)
Subjective:    Patient ID: Jasmin Taylor, female    DOB: [birth year], 54 y.o.   MRN: [id number]  Chief Complaint  Patient presents with  . pain on both sides  . Hypertension    HPI Patient is in today for evaluation of atypical chest pain. She has had a couple of episodes of chest discomfort in various spots in her chest. She denies any pattern as to when it might occur. No associated symptoms although she does have episodes of SOB at times as well. She also notes a good deal of stress lately and she notes that may be contributing. Denies palp/HA/congestion/fevers/GI or GU c/o. Taking meds as prescribed  Past Medical History:  Diagnosis Date  . Benign paroxysmal positional vertigo 08/25/2014  . Cerumen impaction 06/22/2012  . Dysmenorrhea   . Fibroid   . Hyperlipidemia   . Menorrhagia   . Pain of right heel 07/05/2013  . Plantar fasciitis of right foot 07/05/2013  . Preventative health care 08/22/2011  . Tinea pedis 12/02/2011    Past Surgical History:  Procedure Laterality Date  . 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 fibroidectomy    . hysteroscopic resection  5/04    Family History  Problem Relation Age of Onset  . Diabetes Mother        type 2  . Heart attack Mother 57  . Hypertension Mother   . Hyperlipidemia Mother   . Stroke Mother        ministrokes  . Dementia Mother   . Heart disease Mother        MI 2005, s/p 3 stents  . Congestive Heart Failure Mother   . Kidney disease Mother   . Heart attack Maternal Grandmother 67  . Diabetes Maternal Grandmother        type 2  . Hypertension Maternal Grandmother   . Hyperlipidemia Maternal Grandmother   . Stroke Maternal Grandfather   . Heart attack Paternal Grandfather   . Heart disease Paternal Grandfather        MI?  Marland Kitchen Hyperlipidemia Sister   . Diabetes Brother        type 2  . Pancreatitis Brother   . Alcohol abuse Brother   . Mental illness Brother   .  Hyperlipidemia Brother   . Heart disease Brother        w/CAD s/p 1 stent  . Diabetes Brother        type 2  . Heart disease Father   . Pancreatitis Sister     Social History   Socioeconomic History  . Marital status: Married    Spouse name: Not on file  . Number of children: Not on file  . Years of education: Not on file  . Highest education level: Not on file  Occupational History  . Not on file  Tobacco Use  . Smoking status: Never Smoker  . Smokeless tobacco: Never Used  Substance and Sexual Activity  . Alcohol use: No  . Drug use: No  . Sexual activity: Yes    Partners: Male    Birth control/protection: OCP  Other Topics Concern  . Not on file  Social History Narrative   No major dietary restrictions   Walks the dogs, 2    Works for the [place] [employer]      Lives with husband   Social Determinants of Health   Financial Resource Strain:   . Difficulty  of Paying Living Expenses:   Food Insecurity:   . Worried About Charity fundraiser in the Last Year:   . Arboriculturist in the Last Year:   Transportation Needs:   . Film/video editor (Medical):   Marland Kitchen Lack of Transportation (Non-Medical):   Physical Activity:   . Days of Exercise per Week:   . Minutes of Exercise per Session:   Stress:   . Feeling of Stress :   Social Connections:   . Frequency of Communication with Friends and Family:   . Frequency of Social Gatherings with Friends and Family:   . Attends Religious Services:   . Active Member of Clubs or Organizations:   . Attends Archivist Meetings:   Marland Kitchen Marital Status:   Intimate Partner Violence:   . Fear of Current or Ex-Partner:   . Emotionally Abused:   Marland Kitchen Physically Abused:   . Sexually Abused:     Outpatient Medications Prior to Visit  Medication Sig Dispense Refill  . Ascorbic Acid (VITAMIN C PO) Take by mouth.    . meloxicam (MOBIC) 15 MG tablet TAKE 1 TABLET BY MOUTH EVERY DAY AS NEEDED FOR PAIN 30 tablet 3  . NON  FORMULARY daily. Omega red      No facility-administered medications prior to visit.    No Known Allergies  Review of Systems  Constitutional: Negative for fever and malaise/fatigue.  HENT: Negative for congestion.   Eyes: Negative for blurred vision.  Respiratory: Positive for shortness of breath. Negative for wheezing.   Cardiovascular: Positive for chest pain. Negative for palpitations and leg swelling.  Gastrointestinal: Negative for abdominal pain, blood in stool and nausea.  Genitourinary: Negative for dysuria and frequency.  Musculoskeletal: Negative for falls.  Skin: Negative for rash.  Neurological: Negative for dizziness, loss of consciousness and headaches.  Endo/Heme/Allergies: Negative for environmental allergies.  Psychiatric/Behavioral: Negative for depression. The patient is not nervous/anxious.        Objective:    Physical Exam Vitals and nursing note reviewed.  Constitutional:      General: She is not in acute distress.    Appearance: She is well-developed.  HENT:     Head: Normocephalic and atraumatic.     Nose: Nose normal.  Eyes:     General:        Right eye: No discharge.        Left eye: No discharge.  Cardiovascular:     Rate and Rhythm: Normal rate and regular rhythm.     Heart sounds: No murmur.  Pulmonary:     Effort: Pulmonary effort is normal.     Breath sounds: Normal breath sounds.  Abdominal:     General: Bowel sounds are normal.     Palpations: Abdomen is soft.     Tenderness: There is no abdominal tenderness.  Musculoskeletal:     Cervical back: Normal range of motion and neck supple.  Skin:    General: Skin is warm and dry.  Neurological:     Mental Status: She is alert and oriented to person, place, and time.     BP 133/78 (BP Location: Right Arm, Cuff Size: Normal)   Pulse 77   Temp 98 F (36.7 C) (Temporal)   Resp 12   Ht 5\' 6"  (1.676 m)   Wt 154 lb 12.8 oz (70.2 kg)   SpO2 100%   BMI 24.99 kg/m  Wt Readings  from Last 3 Encounters:  10/31/19 154 lb 12.8  oz (70.2 kg)  09/02/19 156 lb (70.8 kg)  05/17/19 153 lb (69.4 kg)    Diabetic Foot Exam - Simple   No data filed     Lab Results  Component Value Date   WBC 7.6 09/02/2019   HGB 14.0 09/02/2019   HCT 41.8 09/02/2019   PLT 278.0 09/02/2019   GLUCOSE 89 09/02/2019   CHOL 228 (H) 09/02/2019   TRIG 126.0 09/02/2019   HDL 59.00 09/02/2019   LDLCALC 144 (H) 09/02/2019   ALT 45 (H) 09/02/2019   AST 31 09/02/2019   NA 141 09/02/2019   K 4.3 09/02/2019   CL 104 09/02/2019   CREATININE 0.68 09/02/2019   BUN 19 09/02/2019   CO2 30 09/02/2019   TSH 0.70 09/02/2019   HGBA1C 5.8 09/02/2019    Lab Results  Component Value Date   TSH 0.70 09/02/2019   Lab Results  Component Value Date   WBC 7.6 09/02/2019   HGB 14.0 09/02/2019   HCT 41.8 09/02/2019   MCV 92.0 09/02/2019   PLT 278.0 09/02/2019   Lab Results  Component Value Date   NA 141 09/02/2019   K 4.3 09/02/2019   CO2 30 09/02/2019   GLUCOSE 89 09/02/2019   BUN 19 09/02/2019   CREATININE 0.68 09/02/2019   BILITOT 0.5 09/02/2019   ALKPHOS 108 09/02/2019   AST 31 09/02/2019   ALT 45 (H) 09/02/2019   PROT 7.2 09/02/2019   ALBUMIN 4.6 09/02/2019   CALCIUM 10.0 09/02/2019   GFR 90.27 09/02/2019   Lab Results  Component Value Date   CHOL 228 (H) 09/02/2019   Lab Results  Component Value Date   HDL 59.00 09/02/2019   Lab Results  Component Value Date   LDLCALC 144 (H) 09/02/2019   Lab Results  Component Value Date   TRIG 126.0 09/02/2019   Lab Results  Component Value Date   CHOLHDL 4 09/02/2019   Lab Results  Component Value Date   HGBA1C 5.8 09/02/2019       Assessment & Plan:   Problem List Items Addressed This Visit    Hyperlipidemia    Encouraged heart healthy diet, increase exercise, avoid trans fats, consider a krill oil cap daily      Chest pain - Primary    Chest pains are atypical and occur in no particular pattern and with no  associated symptoms. EKG if negative. Patient does acknowledge stress may be a factor but she is referred to cardiology for cardiac testing to confirm she is not experiencing cardiac causes of her pain. She will seek care if pain recurs and doe not resolve.       Relevant Orders   EKG 12-Lead (Completed)   Ambulatory referral to Cardiology   ECHOCARDIOGRAM COMPLETE   SOB (shortness of breath)    Echo is ordered, referred to cardiology but discussed at length the possibility of stress playing a role in her sense of being a part of what is causing her symptoms. She agrees but is not interested in counseling or meds for this presently. She just wants to rule out carediac causes. Discussed, symptoms and treatment such as stress reduction and good sleep for 20 minutes      Relevant Orders   EKG 12-Lead (Completed)   Ambulatory referral to Cardiology   ECHOCARDIOGRAM COMPLETE      I am having Jasmin Taylor maintain her NON FORMULARY, Ascorbic Acid (VITAMIN C PO), and meloxicam.  No orders of the defined types were  placed in this encounter.    Penni Homans, MD

## 2019-11-03 NOTE — Assessment & Plan Note (Signed)
Echo is ordered, referred to cardiology but discussed at length the possibility of stress playing a role in her sense of being a part of what is causing her symptoms. She agrees but is not interested in counseling or meds for this presently. She just wants to rule out carediac causes. Discussed, symptoms and treatment such as stress reduction and good sleep for 20 minutes

## 2019-11-05 ENCOUNTER — Ambulatory Visit (HOSPITAL_BASED_OUTPATIENT_CLINIC_OR_DEPARTMENT_OTHER): Admission: RE | Admit: 2019-11-05 | Payer: 59 | Source: Ambulatory Visit

## 2019-11-06 ENCOUNTER — Telehealth: Payer: Self-pay | Admitting: Family Medicine

## 2019-11-06 NOTE — Telephone Encounter (Signed)
Patient would like to know the results of EKG at last visit.  We did the EKG and let her go.  She did wanted to add for that day she only hurt when she bent over.  She states now she is better.

## 2019-11-06 NOTE — Telephone Encounter (Signed)
CallerJernei Taylor  Call Back # [phone]   Subject: Cardiologist Referral   Pt is requesting EKG results from last visit to office. Patient thinking about cancelling her appointment with cardiology if things are normal.   Please Advise

## 2019-11-07 NOTE — Telephone Encounter (Signed)
Left detailed message on machine of the results.

## 2019-11-07 NOTE — Telephone Encounter (Signed)
EKG was completely normal, no concerning changes.

## 2019-11-11 ENCOUNTER — Ambulatory Visit: Payer: 59 | Admitting: Cardiology

## 2020-01-01 ENCOUNTER — Ambulatory Visit: Payer: 59 | Admitting: Medical

## 2020-01-01 ENCOUNTER — Ambulatory Visit (HOSPITAL_BASED_OUTPATIENT_CLINIC_OR_DEPARTMENT_OTHER)
Admission: RE | Admit: 2020-01-01 | Discharge: 2020-01-01 | Disposition: A | Discharge status: Home / Self Care | Payer: 59 | Source: Ambulatory Visit | Attending: Medical | Admitting: Medical

## 2020-01-01 ENCOUNTER — Other Ambulatory Visit: Payer: Self-pay

## 2020-01-01 VITALS — BP 136/89 | HR 98 | Resp 16 | Ht 65.0 in | Wt 156.2 lb

## 2020-01-01 DIAGNOSIS — M542 Cervicalgia: Secondary | ICD-10-CM | POA: Insufficient documentation

## 2020-01-01 DIAGNOSIS — M25511 Pain in right shoulder: Secondary | ICD-10-CM

## 2020-01-01 DIAGNOSIS — M6530 Trigger finger, unspecified finger: Secondary | ICD-10-CM | POA: Diagnosis not present

## 2020-01-01 MED ORDER — METHOCARBAMOL 500 MG PO TABS: ORAL_TABLET | ORAL | 0 refills | Status: DC

## 2020-01-01 MED ORDER — TRAMADOL HCL 50 MG PO TABS: 50.0000 mg | ORAL_TABLET | Freq: Four times a day (QID) | ORAL | 0 refills | Status: AC | PRN

## 2020-01-01 MED ORDER — PREDNISONE 10 MG (21) PO TBPK: ORAL_TABLET | ORAL | 0 refills | Status: DC

## 2020-01-01 NOTE — Patient Instructions (Addendum)
For for your recent severe neck pain with shoulder pain, I want you to get x-ray of both cervical spine and right shoulder today.  Stop meloxicam, Flexeril pain daily other NSAIDs.  Start 6-day taper dose prednisone, Robaxin, and short course of tramadol.  Rx advisement given.  Give me an update on how you are doing by Monday.  If pain is persisting then would recommend sports medicine evaluation.(also get opinion on possible trigger fingers?)  Follow-up date to be determined based on Monday report.

## 2020-01-01 NOTE — Progress Notes (Signed)
Subjective:    Patient ID: Jasmin Taylor, female    DOB: 15-Oct-1965, 54 y.o.   MRN: [id number]  HPI  Pt in for neck and shoulder pain.  Pt states new recent pain in both areas. Pain started in upper neck area about 10 days ago. She notes no recent acute injury. When first noticed neck pain noticed pain in rt shoulder to lateral tricep. Constant achy pain. No severe radiating type pain.  She states has hard time sleeping due to pain.  Pt has tried meloxicam, tylenol and flexeril. Medication are not helping.   [facility]. No xray done.  Pt has bought salon pas but has not used yet.   Did mention remote fall at work in February  but she had mid thoracic area that has gotten better completely.   Pt states pain level varies 7-10/10. At night pain seems most intense.  Also mentions just recently she will need to pull/her 3rd digits straight in morning. States randomly then states happens every day. But does not happen during the day.   Review of Systems  Constitutional: Negative for chills, fatigue and fever.  Respiratory: Negative for cough, chest tightness and wheezing.   Cardiovascular: Negative for chest pain and palpitations.  Gastrointestinal: Negative for abdominal pain.  Musculoskeletal: Positive for back pain.  Neurological: Negative for dizziness and headaches.  Hematological: Negative for adenopathy. Does not bruise/bleed easily.    Past Medical History:  Diagnosis Date  . Benign paroxysmal positional vertigo 08/25/2014  . Cerumen impaction 06/22/2012  . Dysmenorrhea   . Fibroid   . Hyperlipidemia   . Menorrhagia   . Pain of right heel 07/05/2013  . Plantar fasciitis of right foot 07/05/2013  . Preventative health care 08/22/2011  . Tinea pedis 12/02/2011     Social History   Socioeconomic History  . Marital status: Married    Spouse name: Not on file  . Number of children: Not on file  . Years of education: Not on file  . Highest education  level: Not on file  Occupational History  . Not on file  Tobacco Use  . Smoking status: Never Smoker  . Smokeless tobacco: Never Used  Substance and Sexual Activity  . Alcohol use: No  . Drug use: No  . Sexual activity: Yes    Partners: Male    Birth control/protection: OCP  Other Topics Concern  . Not on file  Social History Narrative   No major dietary restrictions   Walks the dogs, 2    Works for the [employer]      Lives with husband   Social Determinants of Radio broadcast assistant Strain:   . Difficulty of Paying Living Expenses:   Food Insecurity:   . Worried About Charity fundraiser in the Last Year:   . Arboriculturist in the Last Year:   Transportation Needs:   . Film/video editor (Medical):   Marland Kitchen Lack of Transportation (Non-Medical):   Physical Activity:   . Days of Exercise per Week:   . Minutes of Exercise per Session:   Stress:   . Feeling of Stress :   Social Connections:   . Frequency of Communication with Friends and Family:   . Frequency of Social Gatherings with Friends and Family:   . Attends Religious Services:   . Active Member of Clubs or Organizations:   . Attends Archivist Meetings:   Marland Kitchen Marital Status:  Intimate Partner Violence:   . Fear of Current or Ex-Partner:   . Emotionally Abused:   Marland Kitchen Physically Abused:   . Sexually Abused:     Past Surgical History:  Procedure Laterality Date  . 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 fibroidectomy    . hysteroscopic resection  5/04    Family History  Problem Relation Age of Onset  . Diabetes Mother        type 2  . Heart attack Mother 68  . Hypertension Mother   . Hyperlipidemia Mother   . Stroke Mother        ministrokes  . Dementia Mother   . Heart disease Mother        MI 2005, s/p 3 stents  . Congestive Heart Failure Mother   . Kidney disease Mother   . Heart attack Maternal Grandmother 67  . Diabetes Maternal  Grandmother        type 2  . Hypertension Maternal Grandmother   . Hyperlipidemia Maternal Grandmother   . Stroke Maternal Grandfather   . Heart attack Paternal Grandfather   . Heart disease Paternal Grandfather        MI?  Marland Kitchen Hyperlipidemia Sister   . Diabetes Brother        type 2  . Pancreatitis Brother   . Alcohol abuse Brother   . Mental illness Brother   . Hyperlipidemia Brother   . Heart disease Brother        w/CAD s/p 1 stent  . Diabetes Brother        type 2  . Heart disease Father   . Pancreatitis Sister     No Known Allergies  Current Outpatient Medications on File Prior to Visit  Medication Sig Dispense Refill  . cyclobenzaprine (FLEXERIL) 5 MG tablet Take 5 mg by mouth. One tablet every 12 hours    . Ascorbic Acid (VITAMIN C PO) Take by mouth. (Patient not taking: Reported on 01/01/2020)    . meloxicam (MOBIC) 15 MG tablet TAKE 1 TABLET BY MOUTH EVERY DAY AS NEEDED FOR PAIN (Patient not taking: Reported on 01/01/2020) 30 tablet 3  . NON FORMULARY daily. Omega red  (Patient not taking: Reported on 01/01/2020)     No current facility-administered medications on file prior to visit.    BP 136/89 (BP Location: Left Arm, Patient Position: Sitting, Cuff Size: Large)   Pulse 98   Resp 16   Ht 5\' 5"  (1.651 m)   Wt 156 lb 3.2 oz (70.9 kg)   SpO2 100%   BMI 25.99 kg/m       Objective:   Physical Exam  General- No acute distress. Pleasant patient. Neck- Full range of motion, no jvd. Rt side neck pain upper region. Para spinal.  Lungs- Clear, even and unlabored. Heart- regular rate and rhythm. Neurologic- CNII- XII grossly intact. Upper ext- symetric grip strength. sensation intact.sharp and dull discrimination intact.  Hands- on flexion and extension of digits no trigger finger seen.         Assessment & Plan:  For for your recent severe neck pain with shoulder pain, I want you to get x-ray of both cervical spine and right shoulder today.  Stop  meloxicam, Flexeril pain daily other NSAIDs.  Start 6-day taper dose prednisone, Robaxin, and short course of tramadol.  Rx advisement given.  Give me an update on how you are doing by Monday.  If pain is  persisting then would recommend sports medicine evaluation.(also get opinion on possible trigger fingers?)  Follow-up date to be determined based on Monday report.  Mackie Pai, PA-C   Time spent with patient today was 30  minutes which consisted of chart review, discussing diagnosis, work up/review of xray treatment and documentation.

## 2020-01-06 ENCOUNTER — Telehealth: Payer: Self-pay | Admitting: Family Medicine

## 2020-01-06 NOTE — Telephone Encounter (Signed)
Ok will refer if she wants/when she is ready.

## 2020-01-06 NOTE — Telephone Encounter (Signed)
Caller: Curt Bears Call back phone number: [phone]  Jasmin Taylor is calling to give you an update on how she is feeling. Patient states she is doing about 70% better. Patient states she prefers to wait for referral. Patient states she is currently doing physical therapy through an old workers' compensation claimed.

## 2020-01-07 NOTE — Telephone Encounter (Signed)
Pt notified , and she is doing better .

## 2020-01-07 NOTE — Telephone Encounter (Signed)
Called pt and lvm

## 2020-01-11 ENCOUNTER — Other Ambulatory Visit: Payer: Self-pay | Admitting: Family Medicine

## 2020-01-20 ENCOUNTER — Telehealth: Payer: Self-pay | Admitting: Family Medicine

## 2020-01-20 ENCOUNTER — Other Ambulatory Visit: Payer: Self-pay | Admitting: Family Medicine

## 2020-01-20 MED ORDER — MELOXICAM 15 MG PO TABS
15.0000 mg | ORAL_TABLET | Freq: Every day | ORAL | 2 refills | Status: DC | PRN
Start: 2020-01-20 — End: 2020-04-14

## 2020-01-20 NOTE — Telephone Encounter (Signed)
Medication: meloxicam (MOBIC) 15 MG tablet [[id number]]     Has the patient contacted their pharmacy?  (If no, request that the patient contact the pharmacy for the refill.) (If yes, when and what did the pharmacy advise?)     Preferred Pharmacy (with phone number or street name): [id number] - [place], [address], [place] [address]  Phone:  [phone] Fax:  [phone]      Agent: Please be advised that RX refills may take up to 3 business days. We ask that you follow-up with your pharmacy.

## 2020-01-20 NOTE — Telephone Encounter (Signed)
Patient saw Percell Miller in June and put her on Flexreil and to stop meloxicam.  On her follow up call she was doing PT and was doing a little better.  She has an appt with you on 03/05/20.

## 2020-01-20 NOTE — Telephone Encounter (Signed)
Meloxicam

## 2020-01-20 NOTE — Telephone Encounter (Signed)
Not sure what is being requested. I am willing to refill the medication that she is requesting or that helps.

## 2020-01-20 NOTE — Telephone Encounter (Signed)
Sent it in [place]

## 2020-01-21 NOTE — Telephone Encounter (Signed)
Left message on machine that rx has been sent into her pharmacy.

## 2020-01-24 ENCOUNTER — Telehealth: Payer: Self-pay | Admitting: Obstetrics & Gynecology

## 2020-01-24 NOTE — Telephone Encounter (Signed)
Spoke with patient. Patient states she is menopausal, was advised to call if any bleeding occurs. Reports light "menses like" cramps on 7/12 followed by spotting and a light menses, still spotting today. Denies any pain or heavy bleeding currently.  Last AEX 05/17/19 w/ Melvia Heaps, CNM. Advised OV needed for further evaluation, patient agreeable.   OV scheduled for 7/20 at 4:15pm with Dr. Quincy Simmonds. Patient declined 7/19 appt. Advised patient I will update Dr. Quincy Simmonds and our office will notify if any additional recommendations. Patient agreeable.   Routing to provider for final review. Patient is agreeable to disposition. Will close encounter.

## 2020-01-24 NOTE — Telephone Encounter (Signed)
Patient noticed "a light period and was told to call if this happened".

## 2020-01-27 LAB — HM MAMMOGRAPHY

## 2020-01-28 ENCOUNTER — Ambulatory Visit: Payer: 59 | Admitting: Obstetrics and Gynecology

## 2020-01-28 ENCOUNTER — Encounter: Payer: Self-pay | Admitting: Obstetrics and Gynecology

## 2020-01-28 ENCOUNTER — Other Ambulatory Visit: Payer: Self-pay

## 2020-01-28 VITALS — BP 110/60 | HR 80 | Resp 10 | Ht 65.5 in | Wt 156.0 lb

## 2020-01-28 DIAGNOSIS — N939 Abnormal uterine and vaginal bleeding, unspecified: Secondary | ICD-10-CM

## 2020-01-28 NOTE — Progress Notes (Signed)
GYNECOLOGY  VISIT   HPI: 54 y.o.   Married  Caucasian  female   [id number] with No LMP recorded (lmp unknown). Patient is perimenopausal.  Here for PMB from 7/12-7/15. Felt like a regular period. No recent hot flashes.  She had them previously.   Patient was seen in November, 2020 and had not been bleeding for one year.  She was on birth control pills and stopped for one week prior to having [facility] which measured 51.1 on 05/30/19.  She does report bleeding with intercourse for a long time.   GYNECOLOGIC HISTORY: No LMP recorded (lmp unknown). Patient is perimenopausal. Contraception:  none Menopausal hormone therapy:  none Last mammogram:  01/21/19 BIRADS 1 negative/density b Last pap smear:   05/17/19 Negative        OB History    Gravida  0   Para  0   Term  0   Preterm  0   AB  0   Living  0     SAB  0   TAB  0   Ectopic  0   Multiple  0   Live Births                 Patient Active Problem List   Diagnosis Date Noted  . Chest pain 11/03/2019  . SOB (shortness of breath) 11/03/2019  . Grief 09/04/2019  . Hypoglycemia 08/28/2018  . Left foot pain 06/16/2017  . Lateral epicondylitis of right elbow 05/17/2016  . Benign paroxysmal positional vertigo 08/25/2014  . Preventative health care 08/22/2011  . Hyperlipidemia     Past Medical History:  Diagnosis Date  . Benign paroxysmal positional vertigo 08/25/2014  . Cerumen impaction 06/22/2012  . Dysmenorrhea   . Fibroid   . Hyperlipidemia   . Menorrhagia   . Pain of right heel 07/05/2013  . Plantar fasciitis of right foot 07/05/2013  . Preventative health care 08/22/2011  . Tinea pedis 12/02/2011    Past Surgical History:  Procedure Laterality Date  . 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 fibroidectomy    . hysteroscopic resection  5/04    Current Outpatient Medications  Medication Sig Dispense Refill  . Ascorbic Acid (VITAMIN C PO) Take by mouth.     . meloxicam  (MOBIC) 15 MG tablet Take 1 tablet (15 mg total) by mouth daily as needed for pain. 30 tablet 2  . Multiple Vitamin (MULTIVITAMIN) tablet Take 1 tablet by mouth daily.    . NON FORMULARY daily. Omega red      No current facility-administered medications for this visit.     ALLERGIES: Patient has no known allergies.  Family History  Problem Relation Age of Onset  . Diabetes Mother        type 2  . Heart attack Mother 72  . Hypertension Mother   . Hyperlipidemia Mother   . Stroke Mother        ministrokes  . Dementia Mother   . Heart disease Mother        MI 2005, s/p 3 stents  . Congestive Heart Failure Mother   . Kidney disease Mother   . Heart attack Maternal Grandmother 67  . Diabetes Maternal Grandmother        type 2  . Hypertension Maternal Grandmother   . Hyperlipidemia Maternal Grandmother   . Stroke Maternal Grandfather   . Heart attack Paternal Grandfather   . Heart disease Paternal Grandfather  MI?  . Hyperlipidemia Sister   . Diabetes Brother        type 2  . Pancreatitis Brother   . Alcohol abuse Brother   . Mental illness Brother   . Hyperlipidemia Brother   . Heart disease Brother        w/CAD s/p 1 stent  . Diabetes Brother        type 2  . Heart disease Father   . Pancreatitis Sister     Social History   Socioeconomic History  . Marital status: Married    Spouse name: Not on file  . Number of children: Not on file  . Years of education: Not on file  . Highest education level: Not on file  Occupational History  . Not on file  Tobacco Use  . Smoking status: Never Smoker  . Smokeless tobacco: Never Used  Substance and Sexual Activity  . Alcohol use: No  . Drug use: No  . Sexual activity: Yes    Partners: Male    Birth control/protection: OCP  Other Topics Concern  . Not on file  Social History Narrative   No major dietary restrictions   Walks the dogs, 2    Works for the [employer]      Lives with husband   Social  Determinants of Radio broadcast assistant Strain:   . Difficulty of Paying Living Expenses:   Food Insecurity:   . Worried About Charity fundraiser in the Last Year:   . Arboriculturist in the Last Year:   Transportation Needs:   . Film/video editor (Medical):   Marland Kitchen Lack of Transportation (Non-Medical):   Physical Activity:   . Days of Exercise per Week:   . Minutes of Exercise per Session:   Stress:   . Feeling of Stress :   Social Connections:   . Frequency of Communication with Friends and Family:   . Frequency of Social Gatherings with Friends and Family:   . Attends Religious Services:   . Active Member of Clubs or Organizations:   . Attends Archivist Meetings:   Marland Kitchen Marital Status:   Intimate Partner Violence:   . Fear of Current or Ex-Partner:   . Emotionally Abused:   Marland Kitchen Physically Abused:   . Sexually Abused:     Review of Systems  Constitutional: Negative.   HENT: Negative.   Eyes: Negative.   Respiratory: Negative.   Cardiovascular: Negative.   Gastrointestinal: Negative.   Endocrine: Negative.   Genitourinary: Negative.   Musculoskeletal: Negative.   Skin: Negative.   Allergic/Immunologic: Negative.   Neurological: Negative.   Hematological: Negative.   Psychiatric/Behavioral: Negative.     PHYSICAL EXAMINATION:    BP 110/60 (BP Location: Right Arm, Patient Position: Sitting, Cuff Size: Normal)   Pulse 80   Resp 10   Ht 5' 5.5" (1.664 m)   Wt 156 lb (70.8 kg)   LMP  (LMP Unknown)   BMI 25.56 kg/m     General appearance: alert, cooperative and appears stated age  Pelvic: External genitalia:  no lesions              Urethra:  normal appearing urethra with no masses, tenderness or lesions              Bartholins and Skenes: normal                 Vagina: normal appearing vagina with normal color and  discharge, no lesions              Cervix: no lesions                Bimanual Exam:  Uterus:  normal size, contour, position,  consistency, mobility, non-tender              Adnexa: no mass, fullness, tenderness          Chaperone was present for exam.  ASSESSMENT  Abnormal uterine bleeding.  Perimenopause versus postmenopausal bleeding.  Post coital bleeding.  Likely atrophy related.  PLAN  Check FSH and E2.  We discussed perimenopausal versus postmenopausal bleeeding.  We discussed atrophy, polyps, ovarian cysts, and malignancy as potential causes for bleeding in menopause.  If labs confirm postmenopause, will do pelvic US and EMB.  Procedures and rationale explained.  Atrophy treatment may be helpful if desired.  20 minute consultation.

## 2020-01-29 ENCOUNTER — Encounter: Payer: Self-pay | Admitting: *Deleted

## 2020-01-29 LAB — ESTRADIOL: Estradiol: 199 pg/mL

## 2020-01-29 LAB — FOLLICLE STIMULATING HORMONE: FSH: 17.3 m[IU]/mL

## 2020-01-31 ENCOUNTER — Encounter: Payer: Self-pay | Admitting: General Practice

## 2020-02-03 ENCOUNTER — Telehealth: Payer: Self-pay | Admitting: Obstetrics and Gynecology

## 2020-02-03 NOTE — Telephone Encounter (Signed)
Patient would like to speak with nurse regarding results of labs on mychart.

## 2020-02-03 NOTE — Telephone Encounter (Signed)
-----   Message from Nunzio Cobbs, MD sent at 01/31/2020  1:13 PM EDT ----- Please let patient know that she is not menopausal by her blood work.  She still needs pregnancy prevention. If she wishes to return to use of birth control pills, I would recommend progesterone only pills (needs to take everyday around the same time and no sugar pills are at the end of the pack.) She can have refills until her next annual exam.  I chose this pill because I saw she had some chest pain and shortness of breath this summer.  She does not need a pelvic ultrasound or an endometrial biopsy.

## 2020-02-03 NOTE — Telephone Encounter (Signed)
Left message to call Meer Reindl, RN at [facility] [phone].   

## 2020-02-04 MED ORDER — NORETHINDRONE 0.35 MG PO TABS: 1.0000 | ORAL_TABLET | Freq: Every day | ORAL | 1 refills | Status: DC

## 2020-02-04 NOTE — Telephone Encounter (Addendum)
Spoke with patient, advised of all results. Patient agreeable to proceed with POP, RX to verified pharmacy. Next AEX scheduled for 06/22/20 at 2pm.   Patient was last SA, unprotected, 2 weeks prior to [place] on 01/28/20.   Dr. Quincy Simmonds -please advise on start of POP.

## 2020-02-04 NOTE — Telephone Encounter (Signed)
Patient is returning call. Patient stated it is okay to leave detailed message.

## 2020-02-05 NOTE — Telephone Encounter (Signed)
Needs to abstain for two weeks after last unprotected intercourse and then do a pregnancy test at this two week mark.  If the pregnancy test is negative, she can start her progesterone only birth control pills.  Please be sure she knows that she needs to take the pills on time every day.

## 2020-02-05 NOTE — Telephone Encounter (Signed)
Call to patient, left detailed message, ok per dpr, name identified on voicemail. Advised as seen below per Dr. Quincy Simmonds, return call to the office if any additional questions.   Encounter closed.

## 2020-03-05 ENCOUNTER — Other Ambulatory Visit: Payer: Self-pay

## 2020-03-05 ENCOUNTER — Ambulatory Visit: Payer: 59 | Admitting: Family Medicine

## 2020-03-05 VITALS — BP 127/64 | HR 78 | Temp 98.1°F | Resp 12 | Ht 66.0 in | Wt 158.2 lb

## 2020-03-05 DIAGNOSIS — F4321 Adjustment disorder with depressed mood: Secondary | ICD-10-CM | POA: Diagnosis not present

## 2020-03-05 DIAGNOSIS — E782 Mixed hyperlipidemia: Secondary | ICD-10-CM

## 2020-03-05 DIAGNOSIS — R945 Abnormal results of liver function studies: Secondary | ICD-10-CM | POA: Diagnosis not present

## 2020-03-05 DIAGNOSIS — R7989 Other specified abnormal findings of blood chemistry: Secondary | ICD-10-CM

## 2020-03-05 DIAGNOSIS — E162 Hypoglycemia, unspecified: Secondary | ICD-10-CM | POA: Diagnosis not present

## 2020-03-05 NOTE — Patient Instructions (Signed)
Call if ready to proceed with colonoscopy and check with insurance regarding cost Flu shot late September or october

## 2020-03-06 LAB — COMPREHENSIVE METABOLIC PANEL
AG Ratio: 1.6 (calc) (ref 1.0–2.5)
ALT: 16 U/L (ref 6–29)
AST: 15 U/L (ref 10–35)
Albumin: 4.2 g/dL (ref 3.6–5.1)
Alkaline phosphatase (APISO): 98 U/L (ref 37–153)
BUN: 16 mg/dL (ref 7–25)
CO2: 27 mmol/L (ref 20–32)
Calcium: 9.7 mg/dL (ref 8.6–10.4)
Chloride: 106 mmol/L (ref 98–110)
Creat: 0.66 mg/dL (ref 0.50–1.05)
Globulin: 2.6 g/dL (calc) (ref 1.9–3.7)
Glucose, Bld: 78 mg/dL (ref 65–99)
Potassium: 4.9 mmol/L (ref 3.5–5.3)
Sodium: 139 mmol/L (ref 135–146)
Total Bilirubin: 0.5 mg/dL (ref 0.2–1.2)
Total Protein: 6.8 g/dL (ref 6.1–8.1)

## 2020-03-06 LAB — LIPID PANEL
Cholesterol: 198 mg/dL (ref <200)
HDL: 51 mg/dL (ref >=50)
LDL Cholesterol (Calc): 121 mg/dL (calc) — ABNORMAL HIGH
Non-HDL Cholesterol (Calc): 147 mg/dL (calc) — ABNORMAL HIGH (ref <130)
Total CHOL/HDL Ratio: 3.9 (calc) (ref <5.0)
Triglycerides: 138 mg/dL (ref <150)

## 2020-03-06 LAB — TSH: TSH: 0.78 mIU/L

## 2020-03-06 LAB — CBC
HCT: 41 % (ref 35.0–45.0)
Hemoglobin: 13.8 g/dL (ref 11.7–15.5)
MCH: 31.4 pg (ref 27.0–33.0)
MCHC: 33.7 g/dL (ref 32.0–36.0)
MCV: 93.2 fL (ref 80.0–100.0)
MPV: 10.1 fL (ref 7.5–12.5)
Platelets: 266 10*3/uL (ref 140–400)
RBC: 4.4 10*6/uL (ref 3.80–5.10)
RDW: 12.6 % (ref 11.0–15.0)
WBC: 9.2 10*3/uL (ref 3.8–10.8)

## 2020-03-10 DIAGNOSIS — R7989 Other specified abnormal findings of blood chemistry: Secondary | ICD-10-CM | POA: Insufficient documentation

## 2020-03-10 NOTE — Assessment & Plan Note (Signed)
Doing well and feels she is managing

## 2020-03-10 NOTE — Assessment & Plan Note (Signed)
Minimize simple carbs and stay active. Continue to monitor

## 2020-03-10 NOTE — Assessment & Plan Note (Signed)
Encouraged heart healthy diet, increase exercise, avoid trans fats, consider a krill oil cap daily 

## 2020-03-10 NOTE — Progress Notes (Signed)
Subjective:    Patient ID: Jasmin Taylor, female    DOB: [birth year], 54 y.o.   MRN: [id number]  Chief Complaint  Patient presents with  . 6 month follow up    HPI Patient is in today for follow up on chronic medical concerns. No recent febrile illness or hospitalizations. She is doing well at this time. She continues to decline the COVID vaccine. She reports she is managing her grief well most days. Denies CP/palp/SOB/HA/congestion/fevers/GI or GU c/o. Taking meds as prescribed  Past Medical History:  Diagnosis Date  . Benign paroxysmal positional vertigo 08/25/2014  . Cerumen impaction 06/22/2012  . Dysmenorrhea   . Fibroid   . Hyperlipidemia   . Menorrhagia   . Pain of right heel 07/05/2013  . Plantar fasciitis of right foot 07/05/2013  . Preventative health care 08/22/2011  . Tinea pedis 12/02/2011    Past Surgical History:  Procedure Laterality Date  . 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 fibroidectomy    . hysteroscopic resection  5/04    Family History  Problem Relation Age of Onset  . Diabetes Mother        type 2  . Heart attack Mother 15  . Hypertension Mother   . Hyperlipidemia Mother   . Stroke Mother        ministrokes  . Dementia Mother   . Heart disease Mother        MI 2005, s/p 3 stents  . Congestive Heart Failure Mother   . Kidney disease Mother   . Heart attack Maternal Grandmother 67  . Diabetes Maternal Grandmother        type 2  . Hypertension Maternal Grandmother   . Hyperlipidemia Maternal Grandmother   . Stroke Maternal Grandfather   . Heart attack Paternal Grandfather   . Heart disease Paternal Grandfather        MI?  Marland Kitchen Hyperlipidemia Sister   . Diabetes Brother        type 2  . Pancreatitis Brother   . Alcohol abuse Brother   . Mental illness Brother   . Hyperlipidemia Brother   . Heart disease Brother        w/CAD s/p 1 stent  . Diabetes Brother        type 2  . Heart disease Father     . Pancreatitis Sister     Social History   Socioeconomic History  . Marital status: Married    Spouse name: Not on file  . Number of children: Not on file  . Years of education: Not on file  . Highest education level: Not on file  Occupational History  . Not on file  Tobacco Use  . Smoking status: Never Smoker  . Smokeless tobacco: Never Used  Substance and Sexual Activity  . Alcohol use: No  . Drug use: No  . Sexual activity: Yes    Partners: Male    Birth control/protection: OCP  Other Topics Concern  . Not on file  Social History Narrative   No major dietary restrictions   Walks the dogs, 2    Works for the [employer]      Lives with husband   Social Determinants of Health   Financial Resource Strain:   . Difficulty of Paying Living Expenses: Not on file  Food Insecurity:   . Worried About Charity fundraiser in the Last Year: Not on file  .  Ran Out of Food in the Last Year: Not on file  Transportation Needs:   . Lack of Transportation (Medical): Not on file  . Lack of Transportation (Non-Medical): Not on file  Physical Activity:   . Days of Exercise per Week: Not on file  . Minutes of Exercise per Session: Not on file  Stress:   . Feeling of Stress : Not on file  Social Connections:   . Frequency of Communication with Friends and Family: Not on file  . Frequency of Social Gatherings with Friends and Family: Not on file  . Attends Religious Services: Not on file  . Active Member of Clubs or Organizations: Not on file  . Attends Archivist Meetings: Not on file  . Marital Status: Not on file  Intimate Partner Violence:   . Fear of Current or Ex-Partner: Not on file  . Emotionally Abused: Not on file  . Physically Abused: Not on file  . Sexually Abused: Not on file    Outpatient Medications Prior to Visit  Medication Sig Dispense Refill  . Ascorbic Acid (VITAMIN C PO) Take by mouth.     . meloxicam (MOBIC) 15 MG tablet Take 1 tablet  (15 mg total) by mouth daily as needed for pain. 30 tablet 2  . Multiple Vitamin (MULTIVITAMIN) tablet Take 1 tablet by mouth daily.    . NON FORMULARY daily. Omega red     . norethindrone (MICRONOR) 0.35 MG tablet Take 1 tablet (0.35 mg total) by mouth daily. 84 tablet 1   No facility-administered medications prior to visit.    No Known Allergies  Review of Systems  Constitutional: Negative for fever and malaise/fatigue.  HENT: Negative for congestion.   Eyes: Negative for blurred vision.  Respiratory: Negative for shortness of breath.   Cardiovascular: Negative for chest pain, palpitations and leg swelling.  Gastrointestinal: Negative for abdominal pain, blood in stool and nausea.  Genitourinary: Negative for dysuria and frequency.  Musculoskeletal: Negative for falls.  Skin: Negative for rash.  Neurological: Negative for dizziness, loss of consciousness and headaches.  Endo/Heme/Allergies: Negative for environmental allergies.  Psychiatric/Behavioral: Negative for depression. The patient is not nervous/anxious.        Objective:    Physical Exam Vitals and nursing note reviewed.  Constitutional:      General: She is not in acute distress.    Appearance: She is well-developed.  HENT:     Head: Normocephalic and atraumatic.     Nose: Nose normal.  Eyes:     General:        Right eye: No discharge.        Left eye: No discharge.  Cardiovascular:     Rate and Rhythm: Normal rate and regular rhythm.     Heart sounds: No murmur heard.   Pulmonary:     Effort: Pulmonary effort is normal.     Breath sounds: Normal breath sounds.  Abdominal:     General: Bowel sounds are normal.     Palpations: Abdomen is soft.     Tenderness: There is no abdominal tenderness.  Musculoskeletal:     Cervical back: Normal range of motion and neck supple.  Skin:    General: Skin is warm and dry.  Neurological:     Mental Status: She is alert and oriented to person, place, and time.      BP 127/64 (BP Location: Left Arm, Patient Position: Sitting, Cuff Size: Large)   Pulse 78   Temp 98.1 F (  36.7 C) (Oral)   Resp 12   Ht 5\' 6"  (1.676 m)   Wt 158 lb 3.2 oz (71.8 kg)   SpO2 97%   BMI 25.53 kg/m  Wt Readings from Last 3 Encounters:  03/05/20 158 lb 3.2 oz (71.8 kg)  01/28/20 156 lb (70.8 kg)  01/01/20 156 lb 3.2 oz (70.9 kg)    Diabetic Foot Exam - Simple   No data filed     Lab Results  Component Value Date   WBC 9.2 03/05/2020   HGB 13.8 03/05/2020   HCT 41.0 03/05/2020   PLT 266 03/05/2020   GLUCOSE 78 03/05/2020   CHOL 198 03/05/2020   TRIG 138 03/05/2020   HDL 51 03/05/2020   LDLCALC 121 (H) 03/05/2020   ALT 16 03/05/2020   AST 15 03/05/2020   NA 139 03/05/2020   K 4.9 03/05/2020   CL 106 03/05/2020   CREATININE 0.66 03/05/2020   BUN 16 03/05/2020   CO2 27 03/05/2020   TSH 0.78 03/05/2020   HGBA1C 5.8 09/02/2019    Lab Results  Component Value Date   TSH 0.78 03/05/2020   Lab Results  Component Value Date   WBC 9.2 03/05/2020   HGB 13.8 03/05/2020   HCT 41.0 03/05/2020   MCV 93.2 03/05/2020   PLT 266 03/05/2020   Lab Results  Component Value Date   NA 139 03/05/2020   K 4.9 03/05/2020   CO2 27 03/05/2020   GLUCOSE 78 03/05/2020   BUN 16 03/05/2020   CREATININE 0.66 03/05/2020   BILITOT 0.5 03/05/2020   ALKPHOS 108 09/02/2019   AST 15 03/05/2020   ALT 16 03/05/2020   PROT 6.8 03/05/2020   ALBUMIN 4.6 09/02/2019   CALCIUM 9.7 03/05/2020   GFR 90.27 09/02/2019   Lab Results  Component Value Date   CHOL 198 03/05/2020   Lab Results  Component Value Date   HDL 51 03/05/2020   Lab Results  Component Value Date   LDLCALC 121 (H) 03/05/2020   Lab Results  Component Value Date   TRIG 138 03/05/2020   Lab Results  Component Value Date   CHOLHDL 3.9 03/05/2020   Lab Results  Component Value Date   HGBA1C 5.8 09/02/2019       Assessment & Plan:   Problem List Items Addressed This Visit     Hyperlipidemia - Primary    Encouraged heart healthy diet, increase exercise, avoid trans fats, consider a krill oil cap daily      Relevant Orders   CBC (Completed)   Lipid panel (Completed)   TSH (Completed)   Hypoglycemia   Relevant Orders   CBC (Completed)   Comprehensive metabolic panel (Completed)   TSH (Completed)   Grief    Doing well and feels she is managing      Abnormal liver function test    Minimize simple carbs and stay active. Continue to monitor      Relevant Orders   CBC (Completed)   Comprehensive metabolic panel (Completed)   TSH (Completed)      I am having Shantara L. Mare Ferrari maintain her NON FORMULARY, Ascorbic Acid (VITAMIN C PO), meloxicam, multivitamin, and norethindrone.  No orders of the defined types were placed in this encounter.    Penni Homans, MD

## 2020-04-14 ENCOUNTER — Other Ambulatory Visit: Payer: Self-pay | Admitting: Family Medicine

## 2020-05-06 ENCOUNTER — Other Ambulatory Visit: Payer: Self-pay | Admitting: Family Medicine

## 2020-05-06 ENCOUNTER — Telehealth: Payer: Self-pay | Admitting: Family Medicine

## 2020-05-06 DIAGNOSIS — E2839 Other primary ovarian failure: Secondary | ICD-10-CM

## 2020-05-06 DIAGNOSIS — Z780 Asymptomatic menopausal state: Secondary | ICD-10-CM

## 2020-05-06 NOTE — Telephone Encounter (Signed)
Let her know I ordered the bone density scan for down stairs at [facility]. Usually start Dexa scans closer to 60 but since she is post menopausal we can get insurance to cover it now.

## 2020-05-06 NOTE — Telephone Encounter (Signed)
Patient states she would like a call back in reference to scheduling a bone density test, patient would like provider thoughts

## 2020-05-07 NOTE — Telephone Encounter (Signed)
Placed the order sorry for typo

## 2020-05-18 ENCOUNTER — Ambulatory Visit: Payer: 59 | Admitting: Certified Nurse Midwife

## 2020-05-19 NOTE — Telephone Encounter (Signed)
Patient has question and would like to speak to someone prior to booking appt

## 2020-05-20 NOTE — Telephone Encounter (Signed)
Pt wanted number to imaging to get density scan complete. Provided with the number downstairs

## 2020-06-02 ENCOUNTER — Ambulatory Visit (HOSPITAL_BASED_OUTPATIENT_CLINIC_OR_DEPARTMENT_OTHER)
Admission: RE | Admit: 2020-06-02 | Discharge: 2020-06-02 | Disposition: A | Discharge status: Home / Self Care | Payer: 59 | Source: Ambulatory Visit | Attending: Family Medicine | Admitting: Family Medicine

## 2020-06-02 ENCOUNTER — Other Ambulatory Visit: Payer: Self-pay

## 2020-06-02 DIAGNOSIS — Z780 Asymptomatic menopausal state: Secondary | ICD-10-CM | POA: Diagnosis not present

## 2020-06-02 DIAGNOSIS — E2839 Other primary ovarian failure: Secondary | ICD-10-CM | POA: Diagnosis present

## 2020-06-22 ENCOUNTER — Other Ambulatory Visit: Payer: Self-pay

## 2020-06-22 ENCOUNTER — Encounter: Payer: Self-pay | Admitting: Obstetrics and Gynecology

## 2020-06-22 ENCOUNTER — Ambulatory Visit (INDEPENDENT_AMBULATORY_CARE_PROVIDER_SITE_OTHER): Payer: 59 | Admitting: Obstetrics and Gynecology

## 2020-06-22 VITALS — BP 136/80 | HR 63 | Ht 65.5 in | Wt 160.0 lb

## 2020-06-22 DIAGNOSIS — Z01419 Encounter for gynecological examination (general) (routine) without abnormal findings: Secondary | ICD-10-CM

## 2020-06-22 MED ORDER — NORETHINDRONE 0.35 MG PO TABS: 1.0000 | ORAL_TABLET | Freq: Every day | ORAL | 3 refills | Status: DC

## 2020-06-22 NOTE — Progress Notes (Signed)
54 y.o. G53P0000 Married Caucasian female here for annual exam.    LNMP 03-29-20 and had slight spotting 04-11-20. No hot flashes.   FSH 17.3 and estradiol 199 on 01/28/20.  Did not do Covid vaccine.  She will do flu vaccine through her pharmacy.   PCP: Penni Homans, MD     Patient's last menstrual period was 03/29/2020 (exact date).         Sexually active: Yes.    The current method of family planning is oral progesterone-only contraceptive.    Exercising: No.  The patient does not participate in regular exercise at present. Smoker:  no  Health Maintenance: Pap: 05-17-19 Neg, 04-29-16 Neg:Neg HR HPV, 9-15--15 Neg History of abnormal Pap:  no MMG: 01-27-20 3D/Neg/density B/BiRads1 Colonoscopy: NEVER; 01-23-17 Neg cologuard.  She will schedule this.  BMD: 06-05-20  Result :Normal TDaP:  2013 Gardasil:   no HIV: no Hep C:no Screening Labs:  PCP.    reports that she has never smoked. She has never used smokeless tobacco. She reports that she does not drink alcohol and does not use drugs.  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      meloxicam (MOBIC) 15 MG tablet TAKE 1 TABLET BY MOUTH EVERY DAY AS NEEDED FOR PAIN 30 tablet 2   Multiple Vitamin (MULTIVITAMIN) tablet Take 1 tablet by mouth daily.     NON FORMULARY daily. Omega red      norethindrone (MICRONOR) 0.35 MG tablet Take 1 tablet (0.35  mg total) by mouth daily. 84 tablet 1   No current facility-administered medications for this visit.    Family History  Problem Relation Age of Onset   Diabetes Mother        type 2   Heart attack Mother 28   Hypertension Mother    Hyperlipidemia Mother    Stroke Mother        ministrokes   Dementia Mother    Heart disease Mother        MI 2005, s/p 3 stents   Congestive Heart Failure Mother    Kidney disease Mother    Heart attack Maternal Grandmother 54   Diabetes Maternal Grandmother        type 2   Hypertension Maternal Grandmother    Hyperlipidemia Maternal Grandmother    Stroke Maternal Grandfather    Heart attack Paternal Grandfather    Heart disease Paternal Grandfather        MI?   Hyperlipidemia Sister    Diabetes Brother        type 2   Pancreatitis Brother    Alcohol abuse Brother    Mental illness Brother    Hyperlipidemia Brother    Heart disease Brother        w/CAD s/p 1 stent   Diabetes Brother        type  2   Heart disease Father    Pancreatitis Sister     Review of Systems  All other systems reviewed and are negative.   Exam:   BP 136/80    Pulse 63    Ht 5' 5.5" (1.664 m)    Wt 160 lb (72.6 kg)    LMP 03/29/2020 (Exact Date)    SpO2 98%    BMI 26.22 kg/m     General appearance: alert, cooperative and appears stated age Head: normocephalic, without obvious abnormality, atraumatic Neck: no adenopathy, supple, symmetrical, trachea midline and thyroid normal to inspection and palpation Lungs: clear to auscultation bilaterally Breasts: normal appearance, no masses or tenderness, No nipple retraction or dimpling, No nipple discharge or bleeding, No axillary adenopathy Heart: regular rate and rhythm Abdomen: soft, non-tender; no masses, no organomegaly Extremities: extremities normal, atraumatic, no cyanosis or edema Skin: skin color, texture, turgor normal. No rashes or lesions Lymph nodes: cervical, supraclavicular,  and axillary nodes normal. Neurologic: grossly normal  Pelvic: External genitalia:  no lesions              No abnormal inguinal nodes palpated.              Urethra:  normal appearing urethra with no masses, tenderness or lesions              Bartholins and Skenes: normal                 Vagina: normal appearing vagina with normal color and discharge, no lesions              Cervix: no lesions              Pap taken: No. Bimanual Exam:  Uterus:  normal size, contour, position, consistency, mobility, non-tender              Adnexa: no mass, fullness, tenderness              Rectal exam: Yes.  .  Confirms.              Anus:  normal sphincter tone, no lesions  Chaperone was present for exam.  Assessment:   Well woman visit with normal exam. Perimenopausal female.  Plan: Mammogram screening discussed. Self breast awareness reviewed. Pap and HR HPV as above. Guidelines for Calcium, Vitamin D, regular exercise program including cardiovascular and weight bearing exercise. We discussed interaction between her birth control pills and vitamin C.  She will eliminate her vitamin C. Refill of POPs for one year.  She will stop her pills 2 weeks prior to her office visit and we can check her hormone status then.  She will call if she starts experiencing menopausal symptoms.  She will schedule her colonoscopy.  Follow up annually and prn.  After visit summary provided.

## 2020-06-22 NOTE — Patient Instructions (Signed)

## 2020-07-20 ENCOUNTER — Other Ambulatory Visit: Payer: Self-pay

## 2020-07-20 ENCOUNTER — Other Ambulatory Visit: Payer: 59

## 2020-07-20 ENCOUNTER — Telehealth (INDEPENDENT_AMBULATORY_CARE_PROVIDER_SITE_OTHER): Payer: 59 | Admitting: Family Medicine

## 2020-07-20 ENCOUNTER — Encounter: Payer: Self-pay | Admitting: Family Medicine

## 2020-07-20 VITALS — Ht 65.5 in | Wt 158.0 lb

## 2020-07-20 DIAGNOSIS — J029 Acute pharyngitis, unspecified: Secondary | ICD-10-CM

## 2020-07-20 DIAGNOSIS — J069 Acute upper respiratory infection, unspecified: Secondary | ICD-10-CM | POA: Diagnosis not present

## 2020-07-20 DIAGNOSIS — Z20822 Contact with and (suspected) exposure to covid-19: Secondary | ICD-10-CM

## 2020-07-20 MED ORDER — FLUTICASONE PROPIONATE 50 MCG/ACT NA SUSP: 2.0000 | Freq: Every day | NASAL | 6 refills | Status: DC

## 2020-07-20 MED ORDER — AMOXICILLIN 875 MG PO TABS: 875.0000 mg | ORAL_TABLET | Freq: Two times a day (BID) | ORAL | 0 refills | Status: DC

## 2020-07-20 NOTE — Progress Notes (Signed)
Virtual Visit via Video Note  I connected with Jasmin Taylor on 07/20/20 at  3:20 PM EST by a video enabled telemedicine application and verified that I am speaking with the correct person using two identifiers.  Location: Patient: home alone  Provider: office    I discussed the limitations of evaluation and management by telemedicine and the availability of in person appointments. The patient expressed understanding and agreed to proceed.  History of Present Illness: Pt is home c/o congestion, no fever since last Wednesday and sore throat.  covid test today but she will not get results for 3-5 days  Pt is taking dayquil and cough drops with some relief    Her husband was sick with similar symptoms  Some sinus pressure , sneezing     Past Medical History:  Diagnosis Date  . Benign paroxysmal positional vertigo 08/25/2014  . Cerumen impaction 06/22/2012  . Dysmenorrhea   . Fibroid   . Hyperlipidemia   . Menorrhagia   . Pain of right heel 07/05/2013  . Plantar fasciitis of right foot 07/05/2013  . Preventative health care 08/22/2011  . Tinea pedis 12/02/2011   Current Outpatient Medications on File Prior to Visit  Medication Sig Dispense Refill  . Ascorbic Acid (VITAMIN C PO) Take by mouth.     . meloxicam (MOBIC) 15 MG tablet TAKE 1 TABLET BY MOUTH EVERY DAY AS NEEDED FOR PAIN 30 tablet 2  . Multiple Vitamin (MULTIVITAMIN) tablet Take 1 tablet by mouth daily.    . NON FORMULARY daily. Omega red     . norethindrone (MICRONOR) 0.35 MG tablet Take 1 tablet (0.35 mg total) by mouth daily. 84 tablet 3   No current facility-administered medications on file prior to visit.    Observations/Objective: There were no vitals filed for this visit.  Pt is in nad  Assessment and Plan: 1. Pharyngitis, unspecified etiology For almost 1 week  Can con't with dayquil  Amoxil per orders con't lozenges  - amoxicillin (AMOXIL) 875 MG tablet; Take 1 tablet (875 mg total) by mouth 2 (two)  times daily.  Dispense: 20 tablet; Refill: 0  2. Upper respiratory tract infection, unspecified type flonase and dayquil  Call if no improvement  - fluticasone (FLONASE) 50 MCG/ACT nasal spray; Place 2 sprays into both nostrils daily.  Dispense: 16 g; Refill: 6   Follow Up Instructions:    I discussed the assessment and treatment plan with the patient. The patient was provided an opportunity to ask questions and all were answered. The patient agreed with the plan and demonstrated an understanding of the instructions.   The patient was advised to call back or seek an in-person evaluation if the symptoms worsen or if the condition fails to improve as anticipated.  I provided 25 minutes of non-face-to-face time during this encounter.   Ann Held, DO

## 2020-07-21 ENCOUNTER — Other Ambulatory Visit: Payer: Self-pay | Admitting: Family Medicine

## 2020-07-23 LAB — NOVEL CORONAVIRUS, NAA: SARS-CoV-2, NAA: DETECTED — AB

## 2020-07-23 LAB — SARS-COV-2, NAA 2 DAY TAT

## 2020-09-07 ENCOUNTER — Encounter: Payer: 59 | Admitting: Family Medicine

## 2020-10-19 ENCOUNTER — Other Ambulatory Visit: Payer: Self-pay | Admitting: Family Medicine

## 2020-12-10 ENCOUNTER — Encounter: Payer: 59 | Admitting: Family Medicine

## 2020-12-21 ENCOUNTER — Other Ambulatory Visit: Payer: Self-pay

## 2020-12-21 ENCOUNTER — Ambulatory Visit (INDEPENDENT_AMBULATORY_CARE_PROVIDER_SITE_OTHER): Payer: 59 | Admitting: Medical

## 2020-12-21 ENCOUNTER — Encounter: Payer: Self-pay | Admitting: Medical

## 2020-12-21 VITALS — BP 120/70 | HR 81 | Temp 98.7°F | Resp 16 | Ht 65.0 in | Wt 153.0 lb

## 2020-12-21 DIAGNOSIS — Z1211 Encounter for screening for malignant neoplasm of colon: Secondary | ICD-10-CM | POA: Diagnosis not present

## 2020-12-21 DIAGNOSIS — M722 Plantar fascial fibromatosis: Secondary | ICD-10-CM

## 2020-12-21 DIAGNOSIS — Z0000 Encounter for general adult medical examination without abnormal findings: Secondary | ICD-10-CM

## 2020-12-21 LAB — COMPREHENSIVE METABOLIC PANEL
ALT: 18 U/L (ref 0–35)
AST: 15 U/L (ref 0–37)
Albumin: 4.4 g/dL (ref 3.5–5.2)
Alkaline Phosphatase: 103 U/L (ref 39–117)
BUN: 14 mg/dL (ref 6–23)
CO2: 27 mEq/L (ref 19–32)
Calcium: 9.4 mg/dL (ref 8.4–10.5)
Chloride: 105 mEq/L (ref 96–112)
Creatinine, Ser: 0.72 mg/dL (ref 0.40–1.20)
GFR: 94.41 mL/min (ref >60.00)
Glucose, Bld: 82 mg/dL (ref 70–99)
Potassium: 4.6 mEq/L (ref 3.5–5.1)
Sodium: 140 mEq/L (ref 135–145)
Total Bilirubin: 0.6 mg/dL (ref 0.2–1.2)
Total Protein: 6.9 g/dL (ref 6.0–8.3)

## 2020-12-21 LAB — CBC WITH DIFFERENTIAL/PLATELET
Basophils Absolute: 0.1 10*3/uL (ref 0.0–0.1)
Basophils Relative: 0.7 % (ref 0.0–3.0)
Eosinophils Absolute: 0.2 10*3/uL (ref 0.0–0.7)
Eosinophils Relative: 1.9 % (ref 0.0–5.0)
HCT: 42.1 % (ref 36.0–46.0)
Hemoglobin: 14.2 g/dL (ref 12.0–15.0)
Lymphocytes Relative: 15.6 % (ref 12.0–46.0)
Lymphs Abs: 1.2 10*3/uL (ref 0.7–4.0)
MCHC: 33.7 g/dL (ref 30.0–36.0)
MCV: 92.7 fl (ref 78.0–100.0)
Monocytes Absolute: 0.4 10*3/uL (ref 0.1–1.0)
Monocytes Relative: 5.3 % (ref 3.0–12.0)
Neutro Abs: 6.1 10*3/uL (ref 1.4–7.7)
Neutrophils Relative %: 76.5 % (ref 43.0–77.0)
Platelets: 241 10*3/uL (ref 150.0–400.0)
RBC: 4.54 Mil/uL (ref 3.87–5.11)
RDW: 13.1 % (ref 11.5–15.5)
WBC: 8 10*3/uL (ref 4.0–10.5)

## 2020-12-21 LAB — LIPID PANEL
Cholesterol: 173 mg/dL (ref 0–200)
HDL: 39.7 mg/dL (ref >39.00)
LDL Cholesterol: 116 mg/dL — ABNORMAL HIGH (ref 0–99)
NonHDL: 133.02
Total CHOL/HDL Ratio: 4
Triglycerides: 86 mg/dL (ref 0.0–149.0)
VLDL: 17.2 mg/dL (ref 0.0–40.0)

## 2020-12-21 NOTE — Patient Instructions (Addendum)
For you wellness exam today I have ordered cbc, cmp and lipid panel.  Vaccine given today.   Recommend exercise and healthy diet.  We will let you know lab results as they come in.  Follow up date appointment will be determined after lab review.    Went ahead and placed referral to sports medicine MD for plantar fascitis.  Referral to GI MD for screening colonoscopy.  Preventive Care 13-55 Years Old, Female Preventive care refers to lifestyle choices and visits with your health care provider that can promote health and wellness. This includes: A yearly physical exam. This is also called an annual wellness visit. Regular dental and eye exams. Immunizations. Screening for certain conditions. Healthy lifestyle choices, such as: Eating a healthy diet. Getting regular exercise. Not using drugs or products that contain nicotine and tobacco. Limiting alcohol use. What can I expect for my preventive care visit? Physical exam Your health care provider will check your: Height and weight. These may be used to calculate your BMI (body mass index). BMI is a measurement that tells if you are at a healthy weight. Heart rate and blood pressure. Body temperature. Skin for abnormal spots. Counseling Your health care provider may ask you questions about your: Past medical problems. Family's medical history. Alcohol, tobacco, and drug use. Emotional well-being. Home life and relationship well-being. Sexual activity. Diet, exercise, and sleep habits. Work and work Statistician. Access to firearms. Method of birth control. Menstrual cycle. Pregnancy history. What immunizations do I need?  Vaccines are usually given at various ages, according to a schedule. Your health care provider will recommend vaccines for you based on your age, medicalhistory, and lifestyle or other factors, such as travel or where you work. What tests do I need? Blood tests Lipid and cholesterol levels. These may be  checked every 5 years, or more often if you are over 8 years old. Hepatitis C test. Hepatitis B test. Screening Lung cancer screening. You may have this screening every year starting at age 9 if you have a 30-pack-year history of smoking and currently smoke or have quit within the past 15 years. Colorectal cancer screening. All adults should have this screening starting at age 17 and continuing until age 66. Your health care provider may recommend screening at age 64 if you are at increased risk. You will have tests every 1-10 years, depending on your results and the type of screening test. Diabetes screening. This is done by checking your blood sugar (glucose) after you have not eaten for a while (fasting). You may have this done every 1-3 years. Mammogram. This may be done every 1-2 years. Talk with your health care provider about when you should start having regular mammograms. This may depend on whether you have a family history of breast cancer. BRCA-related cancer screening. This may be done if you have a family history of breast, ovarian, tubal, or peritoneal cancers. Pelvic exam and Pap test. This may be done every 3 years starting at age 40. Starting at age 55, this may be done every 5 years if you have a Pap test in combination with an HPV test. Other tests STD (sexually transmitted disease) testing, if you are at risk. Bone density scan. This is done to screen for osteoporosis. You may have this scan if you are at high risk for osteoporosis. Talk with your health care provider about your test results, treatment options,and if necessary, the need for more tests. Follow these instructions at home: Eating and drinking  Eat a diet that includes fresh fruits and vegetables, whole grains, lean protein, and low-fat dairy products. Take vitamin and mineral supplements as recommended by your health care provider. Do not drink alcohol if: Your health care provider tells you not to  drink. You are pregnant, may be pregnant, or are planning to become pregnant. If you drink alcohol: Limit how much you have to 0-1 drink a day. Be aware of how much alcohol is in your drink. In the [place] one drink equals one 12 oz bottle of beer (355 mL), one 5 oz glass of wine (148 mL), or one 1 oz glass of hard liquor (44 mL).  Lifestyle Take daily care of your teeth and gums. Brush your teeth every morning and night with fluoride toothpaste. Floss one time each day. Stay active. Exercise for at least 30 minutes 5 or more days each week. Do not use any products that contain nicotine or tobacco, such as cigarettes, e-cigarettes, and chewing tobacco. If you need help quitting, ask your health care provider. Do not use drugs. If you are sexually active, practice safe sex. Use a condom or other form of protection to prevent STIs (sexually transmitted infections). If you do not wish to become pregnant, use a form of birth control. If you plan to become pregnant, see your health care provider for a prepregnancy visit. If told by your health care provider, take low-dose aspirin daily starting at age 43. Find healthy ways to cope with stress, such as: Meditation, yoga, or listening to music. Journaling. Talking to a trusted person. Spending time with friends and family. Safety Always wear your seat belt while driving or riding in a vehicle. Do not drive: If you have been drinking alcohol. Do not ride with someone who has been drinking. When you are tired or distracted. While texting. Wear a helmet and other protective equipment during sports activities. If you have firearms in your house, make sure you follow all gun safety procedures. What's next? Visit your health care provider once a year for an annual wellness visit. Ask your health care provider how often you should have your eyes and teeth checked. Stay up to date on all vaccines. This information is not intended to replace advice  given to you by your health care provider. Make sure you discuss any questions you have with your healthcare provider. Document Revised: 03/31/2020 Document Reviewed: 03/08/2018 Elsevier Patient Education  2022 [employer].

## 2020-12-21 NOTE — Progress Notes (Signed)
Subjective:    Patient ID: Jasmin Taylor, female    DOB: 1965-07-31, 55 y.o.   MRN: [id number]  HPI  Pt in for cpe/wellness exam  She is fasting.  Pt is not exercising regularly. States moderate healthy diet.  Pt has tried to stop caffeine beverages. Occaisonal soda. Non smoker. No alcohol use.  On review up to date on papsmear and mammogram.  Pt had one cologuard in the past 4 years ago and was negative. But insurance did not cover.  Pt also has some need for referral to podiatrist. Hx of plantar fascitis. Pt had custom inserts by podiatrist. She wants to be referred back to same Dr. Hulan Saas.   Review of Systems  Constitutional:  Negative for chills, fatigue and fever.  HENT:  Negative for congestion.   Respiratory:  Negative for cough, chest tightness, shortness of breath and wheezing.   Cardiovascular:  Negative for chest pain and palpitations.  Gastrointestinal:  Negative for abdominal pain, blood in stool and diarrhea.  Genitourinary:  Negative for difficulty urinating, dyspareunia, dysuria, flank pain and frequency.  Musculoskeletal:  Negative for back pain, myalgias and neck stiffness.  Skin:  Negative for rash.      Past Medical History:  Diagnosis Date   Benign paroxysmal positional vertigo 08/25/2014   Cerumen impaction 06/22/2012   Dysmenorrhea    Fibroid    Hyperlipidemia    Menorrhagia    Pain of right heel 07/05/2013   Plantar fasciitis of right foot 07/05/2013   Preventative health care 08/22/2011   Tinea pedis 12/02/2011     Social History   Socioeconomic History   Marital status: Married    Spouse name: Not on file   Number of children: Not on file   Years of education: Not on file   Highest education level: Not on file  Occupational History   Not on file  Tobacco Use   Smoking status: Never   Smokeless tobacco: Never  Vaping Use   Vaping Use: Never used  Substance and Sexual Activity   Alcohol use: No   Drug use: No   Sexual  activity: Yes    Partners: Male    Birth control/protection: OCP    Comment: Micronor  Other Topics Concern   Not on file  Social History Narrative   No major dietary restrictions   Walks the dogs, 2    Works for the [place] [employer]      Lives with husband   Social Determinants of Radio broadcast assistant Strain: Not on file  Food Insecurity: Not on file  Transportation Needs: Not on file  Physical Activity: Not on file  Stress: Not on file  Social Connections: Not on file  Intimate Partner Violence: Not on file    Past Surgical History:  Procedure Laterality Date   [place]   scolosis, 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   fibroidectomy     hysteroscopic resection  5/04    Family History  Problem Relation Age of Onset   Diabetes Mother        type 2   Heart attack Mother 37   Hypertension Mother    Hyperlipidemia Mother    Stroke Mother        ministrokes   Dementia Mother    Heart disease Mother        MI 2005, s/p 3 stents   Congestive Heart Failure Mother    Kidney disease Mother  Heart attack Maternal Grandmother 67   Diabetes Maternal Grandmother        type 2   Hypertension Maternal Grandmother    Hyperlipidemia Maternal Grandmother    Stroke Maternal Grandfather    Heart attack Paternal Grandfather    Heart disease Paternal Grandfather        MI?   Hyperlipidemia Sister    Diabetes Brother        type 2   Pancreatitis Brother    Alcohol abuse Brother    Mental illness Brother    Hyperlipidemia Brother    Heart disease Brother        w/CAD s/p 1 stent   Diabetes Brother        type 2   Heart disease Father    Pancreatitis Sister     No Known Allergies  Current Outpatient Medications on File Prior to Visit  Medication Sig Dispense Refill   Ascorbic Acid (VITAMIN C PO) Take by mouth.      fluticasone (FLONASE) 50 MCG/ACT nasal spray Place 2 sprays into both nostrils daily. 16 g 6   meloxicam (MOBIC) 15 MG tablet Take  1 tablet (15 mg total) by mouth daily as needed for pain. 30 tablet 2   Multiple Vitamin (MULTIVITAMIN) tablet Take 1 tablet by mouth daily.     NON FORMULARY daily. Omega red      norethindrone (MICRONOR) 0.35 MG tablet Take 1 tablet (0.35 mg total) by mouth daily. 84 tablet 3   No current facility-administered medications on file prior to visit.    BP 120/70   Pulse 81   Temp 98.7 F (37.1 C)   Resp 16   Ht 5\' 5"  (1.651 m)   Wt 153 lb (69.4 kg)   SpO2 97%   BMI 25.46 kg/m        Objective:   Physical Exam   General Mental Status- Alert. General Appearance- Not in acute distress.   Skin General: Color- Normal Color. Moisture- Normal Moisture. No worrisome moles or lesions.  Neck Carotid Arteries- Normal color. Moisture- Normal Moisture. No carotid bruits. No JVD.  Chest and Lung Exam Auscultation: Breath Sounds:-Normal.  Cardiovascular Auscultation:Rythm- Regular. Murmurs & Other Heart Sounds:Auscultation of the heart reveals- No Murmurs.  Abdomen Inspection:-Inspeection Normal. Palpation/Percussion:Note:No mass. Palpation and Percussion of the abdomen reveal- Non Tender, Non Distended + BS, no rebound or guarding.   Neurologic Cranial Nerve exam:- CN III-XII intact(No nystagmus), symmetric smile. Strength:- 5/5 equal and symmetric strength both upper and lower extremities.      Assessment & Plan:  For you wellness exam today I have ordered cbc, cmp and lipid panel.  Vaccine given today.   Recommend exercise and healthy diet.  We will let you know lab results as they come in.  Follow up date appointment will be determined after lab review.    Went ahead and placed referral to sports medicine MD for plantar fascitis.  Referral to GI MD for screening colonoscopy.  Mackie Pai, PA-C

## 2020-12-22 ENCOUNTER — Encounter: Payer: Self-pay | Admitting: Gastroenterology

## 2021-01-12 ENCOUNTER — Telehealth: Payer: Self-pay | Admitting: *Deleted

## 2021-01-12 NOTE — Telephone Encounter (Signed)
Patient called and left message in triage voicemail c/o irregular cycles. I left message for patient to call to discuss.

## 2021-01-13 NOTE — Telephone Encounter (Signed)
Patient called back to say no missed pills but early in June she was a couple of hours late taking pill.

## 2021-01-13 NOTE — Telephone Encounter (Signed)
I would recommend stopping the Micronor and having an office visit with me in August.   We can check her hormone levels and assess where she is at in the menopause spectrum.   Have her use condoms in the mean time for pregnancy prevention.

## 2021-01-13 NOTE — Telephone Encounter (Signed)
Patient called to report that she had bleeding 12/27/20 x 4 days and again on 01/05/21 x 4 days. She is 55 yo and takes continuous generic Micronor. She has continued taking it through this. No menopausal symptoms. What to rec?  AEX 06/22/20.

## 2021-01-14 NOTE — Telephone Encounter (Signed)
Left message for patient to call.

## 2021-01-18 ENCOUNTER — Ambulatory Visit (AMBULATORY_SURGERY_CENTER): Payer: 59 | Admitting: *Deleted

## 2021-01-18 ENCOUNTER — Other Ambulatory Visit: Payer: Self-pay

## 2021-01-18 VITALS — Ht 65.0 in | Wt 150.0 lb

## 2021-01-18 DIAGNOSIS — Z1211 Encounter for screening for malignant neoplasm of colon: Secondary | ICD-10-CM

## 2021-01-18 MED ORDER — PEG-KCL-NACL-NASULF-NA ASC-C 100 G PO SOLR
1.0000 | Freq: Once | ORAL | 0 refills | Status: AC
Start: 2021-01-18 — End: 2021-01-18

## 2021-01-18 NOTE — Progress Notes (Signed)
Virtual pre visit completed. Instructions sent through [place].     No egg or soy allergy known to patient  No issues with past sedation with any surgeries or procedures Patient denies ever being told they had issues or difficulty with intubation  No FH of Malignant Hyperthermia No diet pills per patient No home 02 use per patient  No blood thinners per patient  Pt denies issues with constipation  No A fib or A flutter  EMMI video to pt or via [place] 19 guidelines implemented in [place] today with Pt and RN   Due to the COVID-19 pandemic we are asking patients to follow certain guidelines.  Pt aware of COVID protocols and [facility] guidelines

## 2021-01-19 NOTE — Telephone Encounter (Signed)
I spoke with patient and informed her. Message sent to the appointment desk to arrange Aug visit.

## 2021-01-22 NOTE — Progress Notes (Deleted)
[facility] Phone: [phone] Subjective:    [name] by the request  of:  Mosie Lukes, MD  CC:   [id number]  CAYLOR CERINO is a 55 y.o. female coming in with complaint of plantar fascitis. Was seen in 2015 for same issue by Dr. Tamala Julian.   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   fibroidectomy     hysteroscopic resection  5/04   Social History   Socioeconomic History   Marital status: Married    Spouse name: Not on file   Number of children: Not on file   Years of education: Not on file   Highest education level: Not on file  Occupational History   Not on file  Tobacco Use   Smoking status: Never   Smokeless tobacco: Never  Vaping Use   Vaping Use: Never used  Substance and Sexual Activity   Alcohol use: No   Drug use: No   Sexual activity: Yes    Partners: Male    Birth control/protection: OCP    Comment: Micronor  Other Topics Concern   Not on file  Social History Narrative   No major dietary restrictions   Walks the dogs, 2    Works for the [employer]      Lives with husband   Social Determinants of Radio broadcast assistant Strain: Not on file  Food Insecurity: Not on file  Transportation Needs: Not on file  Physical Activity: Not on file  Stress: Not on file  Social Connections: Not on file   No Known Allergies Family  History  Problem Relation Age of Onset   Diabetes Mother        type 2   Heart attack Mother 80   Hypertension Mother    Hyperlipidemia Mother    Stroke Mother        ministrokes   Dementia Mother    Heart disease Mother        MI 2005, s/p 3 stents   Congestive Heart Failure Mother    Kidney disease Mother    Heart disease Father    Hyperlipidemia Sister    Pancreatitis Sister    Colon polyps Brother    Diabetes Brother        type 2   Pancreatitis Brother    Colon polyps Brother    Alcohol abuse Brother    Mental illness Brother    Hyperlipidemia Brother    Heart disease Brother        w/CAD s/p 1 stent   Diabetes Brother        type 2   Heart attack Maternal Grandmother 67   Diabetes Maternal Grandmother        type 2   Hypertension Maternal Grandmother    Hyperlipidemia Maternal Grandmother    Stroke Maternal Grandfather  Heart attack Paternal Grandfather    Heart disease Paternal Grandfather        MI?   Colon cancer Neg Hx    Esophageal cancer Neg Hx    Rectal cancer Neg Hx    Stomach cancer Neg Hx     Current Outpatient Medications (Endocrine & Metabolic):    norethindrone (MICRONOR) 0.35 MG tablet, Take 1 tablet (0.35 mg total) by mouth daily.   Current Outpatient Medications (Respiratory):    fluticasone (FLONASE) 50 MCG/ACT nasal spray, Place 2 sprays into both nostrils daily. (Patient not taking: Reported on 01/18/2021)  Current Outpatient Medications (Analgesics):    meloxicam (MOBIC) 15 MG tablet, Take 1 tablet (15 mg total) by mouth daily as needed for pain. (Patient not taking: Reported on 01/18/2021)   Current Outpatient Medications (Other):    Ascorbic Acid (VITAMIN C PO), Take by mouth.  (Patient not taking: Reported on 01/18/2021)   Multiple Vitamin (MULTIVITAMIN) tablet, Take 1 tablet by mouth daily.   NON FORMULARY, daily. Omega red    Reviewed prior external information including notes and imaging from  primary care provider As well  as notes that were available from care everywhere and other healthcare systems.  Past medical history, social, surgical and family history all reviewed in electronic medical record.  No pertanent information unless stated regarding to the chief complaint.   Review of Systems:  No headache, visual changes, nausea, vomiting, diarrhea, constipation, dizziness, abdominal pain, skin rash, fevers, chills, night sweats, weight loss, swollen lymph nodes, body aches, joint swelling, chest pain, shortness of breath, mood changes. POSITIVE muscle aches  Objective  There were no vitals taken for this visit.   General: No apparent distress alert and oriented x3 mood and affect normal, dressed appropriately.  HEENT: Pupils equal, extraocular movements intact  Respiratory: Patient's speak in full sentences and does not appear short of breath  Cardiovascular: No lower extremity edema, non tender, no erythema  Gait normal with good balance and coordination.  MSK:  Non tender with full range of motion and good stability and symmetric strength and tone of shoulders, elbows, wrist, hip, knee and ankles bilaterally.     Impression and Recommendations:     The above documentation has been reviewed and is accurate and complete Jacqualin Combes

## 2021-01-29 ENCOUNTER — Ambulatory Visit: Payer: 59 | Admitting: Family Medicine

## 2021-02-01 ENCOUNTER — Ambulatory Visit (AMBULATORY_SURGERY_CENTER): Payer: 59 | Admitting: Gastroenterology

## 2021-02-01 ENCOUNTER — Encounter: Payer: Self-pay | Admitting: Gastroenterology

## 2021-02-01 ENCOUNTER — Other Ambulatory Visit: Payer: Self-pay

## 2021-02-01 VITALS — BP 136/68 | HR 53 | Temp 98.0°F | Resp 19 | Ht 65.0 in | Wt 150.0 lb

## 2021-02-01 DIAGNOSIS — K5140 Inflammatory polyps of colon without complications: Secondary | ICD-10-CM | POA: Diagnosis not present

## 2021-02-01 DIAGNOSIS — D122 Benign neoplasm of ascending colon: Secondary | ICD-10-CM

## 2021-02-01 DIAGNOSIS — Z1211 Encounter for screening for malignant neoplasm of colon: Secondary | ICD-10-CM | POA: Diagnosis present

## 2021-02-01 LAB — HM COLONOSCOPY

## 2021-02-01 MED ORDER — SODIUM CHLORIDE 0.9 % IV SOLN: 500.0000 mL | Freq: Once | INTRAVENOUS | Status: DC

## 2021-02-01 NOTE — Patient Instructions (Signed)
Handout given for polyps.  YOU HAD AN ENDOSCOPIC PROCEDURE TODAY AT THE  ENDOSCOPY CENTER:   Refer to the procedure report that was given to you for any specific questions about what was found during the examination.  If the procedure report does not answer your questions, please call your gastroenterologist to clarify.  If you requested that your care partner not be given the details of your procedure findings, then the procedure report has been included in a sealed envelope for you to review at your convenience later.  YOU SHOULD EXPECT: Some feelings of bloating in the abdomen. Passage of more gas than usual.  Walking can help get rid of the air that was put into your GI tract during the procedure and reduce the bloating. If you had a lower endoscopy (such as a colonoscopy or flexible sigmoidoscopy) you may notice spotting of blood in your stool or on the toilet paper. If you underwent a bowel prep for your procedure, you may not have a normal bowel movement for a few days.  Please Note:  You might notice some irritation and congestion in your nose or some drainage.  This is from the oxygen used during your procedure.  There is no need for concern and it should clear up in a day or so.  SYMPTOMS TO REPORT IMMEDIATELY:   Following lower endoscopy (colonoscopy or flexible sigmoidoscopy):  Excessive amounts of blood in the stool  Significant tenderness or worsening of abdominal pains  Swelling of the abdomen that is new, acute  Fever of 100F or higher  For urgent or emergent issues, a gastroenterologist can be reached at any hour by calling [phone]. Do not use MyChart messaging for urgent concerns.    DIET:  We do recommend a small meal at first, but then you may proceed to your regular diet.  Drink plenty of fluids but you should avoid alcoholic beverages for 24 hours.  ACTIVITY:  You should plan to take it easy for the rest of today and you should NOT DRIVE or use heavy  machinery until tomorrow (because of the sedation medicines used during the test).    FOLLOW UP: Our staff will call the number listed on your records 48-72 hours following your procedure to check on you and address any questions or concerns that you may have regarding the information given to you following your procedure. If we do not reach you, we will leave a message.  We will attempt to reach you two times.  During this call, we will ask if you have developed any symptoms of COVID 19. If you develop any symptoms (ie: fever, flu-like symptoms, shortness of breath, cough etc.) before then, please call [phone].  If you test positive for Covid 19 in the 2 weeks post procedure, please call and report this information to us.    If any biopsies were taken you will be contacted by phone or by letter within the next 1-3 weeks.  Please call us at [phone] if you have not heard about the biopsies in 3 weeks.    SIGNATURES/CONFIDENTIALITY: You and/or your care partner have signed paperwork which will be entered into your electronic medical record.  These signatures attest to the fact that that the information above on your After Visit Summary has been reviewed and is understood.  Full responsibility of the confidentiality of this discharge information lies with you and/or your care-partner. 

## 2021-02-01 NOTE — Progress Notes (Signed)
Called to room to assist during endoscopic procedure.  Patient ID and intended procedure confirmed with present staff. Received instructions for my participation in the procedure from the performing physician.  

## 2021-02-01 NOTE — Progress Notes (Signed)
PT taken to PACU. Monitors in place. VSS. Report given to RN. 

## 2021-02-01 NOTE — Progress Notes (Signed)
Vitals by CW. Pt's states no medical or surgical changes since previsit or office visit.

## 2021-02-01 NOTE — Op Note (Signed)
[facility] Patient Name: Jasmin Taylor Procedure Date: 02/01/2021 2:20 PM MRN: [id number] Endoscopist: Mallie Mussel L. Loletha Carrow , MD Age: 55 Referring MD:  Date of Birth: [birth year] Gender: Female Account #: [id number] Procedure:                Colonoscopy Indications:              Screening for colorectal malignant neoplasm, This                            is the patient's first colonoscopy Medicines:                Monitored Anesthesia Care Procedure:                Pre-Anesthesia Assessment:                           - Prior to the procedure, a History and Physical                            was performed, and patient medications and                            allergies were reviewed. The patient's tolerance of                            previous anesthesia was also reviewed. The risks                            and benefits of the procedure and the sedation                            options and risks were discussed with the patient.                            All questions were answered, and informed consent                            was obtained. Prior Anticoagulants: The patient has                            taken no previous anticoagulant or antiplatelet                            agents. ASA Grade Assessment: II - A patient with                            mild systemic disease. After reviewing the risks                            and benefits, the patient was deemed in                            satisfactory condition to undergo the procedure.  After obtaining informed consent, the colonoscope                            was passed under direct vision. Throughout the                            procedure, the patient's blood pressure, pulse, and                            oxygen saturations were monitored continuously. The                            CF [id number] was introduced through the anus                            and advanced to the  the terminal ileum, with                            identification of the appendiceal orifice and IC                            valve. The colonoscopy was performed with                            difficulty due to a redundant colon and significant                            looping. Successful completion of the procedure was                            aided by changing the patient to a supine position                            and using manual pressure. The patient tolerated                            the procedure well. The quality of the bowel                            preparation was excellent. The terminal ileum,                            ileocecal valve, appendiceal orifice, and rectum                            were photographed. The bowel preparation used was                            MoviPrep. Scope In: 2:33:05 PM Scope Out: 3:01:17 PM Scope Withdrawal Time: 0 hours 16 minutes 57 seconds  Total Procedure Duration: 0 hours 28 minutes 12 seconds  Findings:                 The perianal and digital rectal examinations were  normal.                           The terminal ileum appeared normal.                           Two semi-sessile polyps were found in the proximal                            ascending colon. The polyps were diminutive in                            size. These polyps were removed with a cold snare.                            Resection and retrieval were complete.                           The colon (entire examined portion) was redundant.                           Retroflexion in the rectum was not performed due to                            narrow anatomy.                           The exam was otherwise without abnormality. Complications:            No immediate complications. Estimated Blood Loss:     Estimated blood loss was minimal. Impression:               - The examined portion of the ileum was normal.                            - Two diminutive polyps in the proximal ascending                            colon, removed with a cold snare. Resected and                            retrieved.                           - Redundant colon.                           - The examination was otherwise normal. Recommendation:           - Patient has a contact number available for                            emergencies. The signs and symptoms of potential                            delayed complications were discussed with the  patient. Return to normal activities tomorrow.                            Written discharge instructions were provided to the                            patient.                           - Resume previous diet.                           - Continue present medications.                           - Await pathology results.                           - Repeat colonoscopy is recommended for                            surveillance. The colonoscopy date will be                            determined after pathology results from today's                            exam become available for review. [name] L. Loletha Carrow, MD 02/01/2021 3:07:02 PM This report has been signed electronically.

## 2021-02-02 ENCOUNTER — Encounter: Payer: Self-pay | Admitting: Family Medicine

## 2021-02-03 ENCOUNTER — Telehealth: Payer: Self-pay | Admitting: *Deleted

## 2021-02-03 NOTE — Telephone Encounter (Signed)
  Follow up Call-  Call back number 02/01/2021  Post procedure Call Back phone  # [phone]  Permission to leave phone message Yes  Some recent data might be hidden     Patient questions:  Do you have a fever, pain , or abdominal swelling? No. Pain Score  0 *  Have you tolerated food without any problems? Yes.    Have you been able to return to your normal activities? Yes.    Do you have any questions about your discharge instructions: Diet   No. Medications  No. Follow up visit  No.  Do you have questions or concerns about your Care? No.  Actions: * If pain score is 4 or above: No action needed, pain <4.Have you developed a fever since your procedure? no  2.   Have you had an respiratory symptoms (SOB or cough) since your procedure? no  3.   Have you tested positive for COVID 19 since your procedure no  4.   Have you had any family members/close contacts diagnosed with the COVID 19 since your procedure?  no   If yes to any of these questions please route to Joylene John, RN and Joella Prince, RN

## 2021-02-08 ENCOUNTER — Encounter: Payer: Self-pay | Admitting: Gastroenterology

## 2021-02-23 ENCOUNTER — Other Ambulatory Visit: Payer: Self-pay | Admitting: Family Medicine

## 2021-02-25 NOTE — Progress Notes (Signed)
GYNECOLOGY  VISIT   HPI: 55 y.o.   Married  Caucasian  female   G0P0000 with Patient's last menstrual period was 12/27/2020 (approximate).   here for irregular bleeding. Patient stopped Micronor 01/2021 and had not been having cycles.  She stating had bleeding sometime in 12-27-20 for 5-6 days, stopped and then began bleeding again 01-05-21 for 4 days. No bleeding since. Her prior LMP was 04/11/20.  Took a pill late around 6/4 or 12/13/20.  She has been off her pills for at least one month.  Had some increased heat last night.  Not having hot flashes during the day.  FSH 17.3 and estradiol 199 on 01/28/20.  GYNECOLOGIC HISTORY: Patient's last menstrual period was 12/27/2020 (approximate). Contraception: abstinence Menopausal hormone therapy:  n/a Last mammogram: 02-02-21 3D/Neg/BiRads1 Last pap smear: 05-17-19 Neg, 04-29-16 Neg:Neg HR HPV, 9-15--15 Neg. All normal paps.         OB History     Gravida  0   Para  0   Term  0   Preterm  0   AB  0   Living  0      SAB  0   IAB  0   Ectopic  0   Multiple  0   Live Births                 Patient Active Problem List   Diagnosis Date Noted   Abnormal liver function test 03/10/2020   Grief 09/04/2019   Hypoglycemia 08/28/2018   Benign paroxysmal positional vertigo 08/25/2014   Preventative health care 08/22/2011   Hyperlipidemia     Past Medical History:  Diagnosis Date   Benign paroxysmal positional vertigo 08/25/2014   Cerumen impaction 06/22/2012   Dysmenorrhea    Fibroid    Hyperlipidemia    Menorrhagia    Pain of right heel 07/05/2013   Plantar fasciitis of right foot 07/05/2013   Preventative health care 08/22/2011   Tinea pedis 12/02/2011    Past Surgical History:  Procedure Laterality Date   BACK SURGERY  1984   scolosis, 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     NON FORMULARY daily. Omega red      No current facility-administered medications for this visit.     ALLERGIES: Patient has no known allergies.  Family History  Problem Relation Age of Onset   Diabetes Mother        type 2   Heart attack Mother 60   Hypertension Mother    Hyperlipidemia Mother    Stroke Mother        ministrokes   Dementia Mother    Heart disease Mother        MI 2005, s/p 3 stents   Congestive Heart Failure Mother    Kidney disease Mother    Heart disease Father    Hyperlipidemia Sister    Pancreatitis Sister    Colon polyps Brother    Diabetes Brother        type 2   Pancreatitis Brother    Colon polyps Brother    Alcohol abuse Brother    Mental illness Brother    Hyperlipidemia Brother  Heart disease Brother        w/CAD s/p 1 stent   Diabetes Brother        type 2   Heart attack Maternal Grandmother 67   Diabetes Maternal Grandmother        type 2   Hypertension Maternal Grandmother    Hyperlipidemia Maternal Grandmother    Stroke Maternal Grandfather    Heart attack Paternal Grandfather    Heart disease Paternal Grandfather        MI?   Colon cancer Neg Hx    Esophageal cancer Neg Hx    Rectal cancer Neg Hx    Stomach cancer Neg Hx     Social History   Socioeconomic History   Marital status: Married    Spouse name: Not on file   Number of children: Not on file   Years of education: Not on file   Highest education level: Not on file  Occupational History   Not on file  Tobacco Use   Smoking status: Never   Smokeless tobacco: Never  Vaping Use   Vaping Use: Never used  Substance and Sexual Activity   Alcohol use: No   Drug use: No   Sexual activity: Yes    Partners: Male    Birth control/protection: OCP    Comment: Micronor  Other Topics Concern   Not on file   Social History Narrative   No major dietary restrictions   Walks the dogs, 2    Works for the [place] [employer]      Lives with husband   Social Determinants of Radio broadcast assistant Strain: Not on file  Food Insecurity: Not on file  Transportation Needs: Not on file  Physical Activity: Not on file  Stress: Not on file  Social Connections: Not on file  Intimate Partner Violence: Not on file    Review of Systems  All other systems reviewed and are negative.  PHYSICAL EXAMINATION:    BP 120/62   Pulse 85   Ht 5' 5.5" (1.664 m)   Wt 160 lb (72.6 kg)   LMP 12/27/2020 (Approximate)   SpO2 97%   BMI 26.22 kg/m     General appearance: alert, cooperative and appears stated age   Pelvic: External genitalia:  no lesions              Urethra:  normal appearing urethra with no masses, tenderness or lesions              Bartholins and Skenes: normal                 Vagina: normal appearing vagina with normal color and discharge, no lesions              Cervix: no lesions                Bimanual Exam:  Uterus:  normal size, contour, position, consistency, mobility, non-tender              Adnexa: no mass, fullness, tenderness              Chaperone was present for exam:  Estill Bamberg, CMA.  ASSESSMENT  Irregular menses.  Recent discontinuation of Micronor.  I suspect patient is perimenopausal but not postmenopausal.  PLAN  Check FSH and estradiol. If not postmenopausal, will restart Micronor.  If postmenopausal. Then return for pelvic US.  Rationale explained to evaluate for atrophy, polyps, ovarian cysts, endometrial hyperplasia and malignancy.  An After Visit Summary was printed and given to the patient.   23 min total time was spent for this patient encounter, including preparation, face-to-face counseling with the patient, coordination of care, and documentation of the encounter.

## 2021-02-26 ENCOUNTER — Encounter: Payer: Self-pay | Admitting: Obstetrics and Gynecology

## 2021-02-26 ENCOUNTER — Other Ambulatory Visit: Payer: Self-pay

## 2021-02-26 ENCOUNTER — Ambulatory Visit (INDEPENDENT_AMBULATORY_CARE_PROVIDER_SITE_OTHER): Payer: 59 | Admitting: Obstetrics and Gynecology

## 2021-02-26 VITALS — BP 120/62 | HR 85 | Ht 65.5 in | Wt 160.0 lb

## 2021-02-26 DIAGNOSIS — N926 Irregular menstruation, unspecified: Secondary | ICD-10-CM | POA: Diagnosis not present

## 2021-02-26 LAB — ESTRADIOL: Estradiol: 20 pg/mL

## 2021-02-26 LAB — FOLLICLE STIMULATING HORMONE: FSH: 80.3 m[IU]/mL

## 2021-05-13 ENCOUNTER — Telehealth: Payer: Self-pay | Admitting: *Deleted

## 2021-05-13 DIAGNOSIS — N939 Abnormal uterine and vaginal bleeding, unspecified: Secondary | ICD-10-CM

## 2021-05-13 NOTE — Telephone Encounter (Signed)
Patient informed orders placed. Message sent to appointments.

## 2021-05-13 NOTE — Telephone Encounter (Signed)
Patient was told to call if any further bleeding. Patient called  reports bleeding x 3 days starting on 04/22/21,she wore a pad and had to change pads 2-3 times for 2 days. No further bleeding since then.   Per last [facility] note "Your blood work is consistent with menopause.  You do not need to restart the Micronor.  Please let me know if you have any future vagina bleeding.   Please advise

## 2021-05-13 NOTE — Telephone Encounter (Signed)
Please schedule a pelvic ultrasound and possible endometrial biopsy with me.  I want to understand the reason for her bleeding and understand if we need to do anything additional for her care.

## 2021-05-18 NOTE — Telephone Encounter (Signed)
Patient scheduled on 05/25/21

## 2021-05-25 ENCOUNTER — Encounter: Payer: Self-pay | Admitting: Obstetrics and Gynecology

## 2021-05-25 ENCOUNTER — Ambulatory Visit (INDEPENDENT_AMBULATORY_CARE_PROVIDER_SITE_OTHER): Payer: 59

## 2021-05-25 ENCOUNTER — Other Ambulatory Visit (HOSPITAL_COMMUNITY)
Admission: RE | Admit: 2021-05-25 | Discharge: 2021-05-25 | Disposition: A | Discharge status: Home / Self Care | Payer: 59 | Source: Ambulatory Visit | Attending: Obstetrics and Gynecology | Admitting: Obstetrics and Gynecology

## 2021-05-25 ENCOUNTER — Other Ambulatory Visit: Payer: Self-pay

## 2021-05-25 ENCOUNTER — Ambulatory Visit (INDEPENDENT_AMBULATORY_CARE_PROVIDER_SITE_OTHER): Payer: 59 | Admitting: Obstetrics and Gynecology

## 2021-05-25 VITALS — BP 126/78 | HR 82 | Ht 65.5 in | Wt 160.0 lb

## 2021-05-25 DIAGNOSIS — N939 Abnormal uterine and vaginal bleeding, unspecified: Secondary | ICD-10-CM

## 2021-05-25 DIAGNOSIS — N950 Postmenopausal bleeding: Secondary | ICD-10-CM | POA: Diagnosis not present

## 2021-05-25 NOTE — Progress Notes (Signed)
GYNECOLOGY  VISIT   HPI: 55 y.o.   Married  Caucasian  female   G0P0000 with No LMP recorded. Patient is perimenopausal.   here for pelvic ultrasound and possible EMB.     Patient had bleeding sometime in 12-27-20 for 5-6 days, stopped and then began bleeding again 01-05-21 for 4 days.   Her prior LMP was 04/11/20.   Took a pill late around 6/4 or 12/13/20.  She has been off her Micronor pills since July.  FSH 80.3 and estradiol 20 on 02/26/21.  She started bleeding again for 3 days on 04/22/21.  No cramping or breast tenderness.  Some right groin discomfort or strain.   Had hot flashes this summer but none now.   Last sexual activity was about 4 - 6 weeks ago.   GYNECOLOGIC HISTORY: No LMP recorded. Patient is perimenopausal. Contraception:  None Menopausal hormone therapy:  none Last mammogram:  02-02-21 3D/Neg/BiRads1 Last pap smear: 05-17-19 Neg, 04-29-16 Neg:Neg HR HPV, 9-15--15 Neg. All normal paps.         OB History     Gravida  0   Para  0   Term  0   Preterm  0   AB  0   Living  0      SAB  0   IAB  0   Ectopic  0   Multiple  0   Live Births                 Patient Active Problem List   Diagnosis Date Noted   Abnormal liver function test 03/10/2020   Grief 09/04/2019   Hypoglycemia 08/28/2018   Benign paroxysmal positional vertigo 08/25/2014   Preventative health care 08/22/2011   Hyperlipidemia     Past Medical History:  Diagnosis Date   Benign paroxysmal positional vertigo 08/25/2014   Cerumen impaction 06/22/2012   Dysmenorrhea    Fibroid    Hyperlipidemia    Menorrhagia    Pain of right heel 07/05/2013   Plantar fasciitis of right foot 07/05/2013   Preventative health care 08/22/2011   Tinea pedis 12/02/2011    Past Surgical History:  Procedure Laterality Date   BACK SURGERY  1984   scolosis, 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     NON FORMULARY daily. Omega red      No current facility-administered medications for this visit.     ALLERGIES: Patient has no known allergies.  Family History  Problem Relation Age of Onset   Diabetes Mother        type 2   Heart attack Mother 9   Hypertension Mother    Hyperlipidemia Mother    Stroke Mother        ministrokes   Dementia Mother    Heart disease Mother        MI 2005, s/p 3 stents   Congestive Heart Failure Mother    Kidney disease Mother    Heart disease Father    Hyperlipidemia Sister    Pancreatitis Sister    Colon polyps Brother    Diabetes Brother        type 2   Pancreatitis Brother  Colon polyps Brother    Alcohol abuse Brother    Mental illness Brother    Hyperlipidemia Brother    Heart disease Brother        w/CAD s/p 1 stent   Diabetes Brother        type 2   Heart attack Maternal Grandmother 67   Diabetes Maternal Grandmother        type 2   Hypertension Maternal Grandmother    Hyperlipidemia Maternal Grandmother    Stroke Maternal Grandfather    Heart attack Paternal Grandfather    Heart disease Paternal Grandfather        MI?   Colon cancer Neg Hx    Esophageal cancer Neg Hx    Rectal cancer Neg Hx    Stomach cancer Neg Hx     Social History   Socioeconomic History   Marital status: Married    Spouse name: Not on file   Number of children: Not on file   Years of education: Not on file   Highest education level: Not on file  Occupational History   Not on file  Tobacco Use   Smoking status: Never   Smokeless tobacco: Never  Vaping Use   Vaping Use: Never used  Substance and Sexual Activity   Alcohol use: No   Drug use: No   Sexual activity: Yes    Partners: Male    Birth control/protection: OCP    Comment:  Micronor  Other Topics Concern   Not on file  Social History Narrative   No major dietary restrictions   Walks the dogs, 2    Works for the [place] [employer]      Lives with husband   Social Determinants of Radio broadcast assistant Strain: Not on file  Food Insecurity: Not on file  Transportation Needs: Not on file  Physical Activity: Not on file  Stress: Not on file  Social Connections: Not on file  Intimate Partner Violence: Not on file    Review of Systems  All other systems reviewed and are negative.  PHYSICAL EXAMINATION:    BP 126/78   Pulse 82   Ht 5' 5.5" (1.664 m)   Wt 160 lb (72.6 kg)   SpO2 99%   BMI 26.22 kg/m     General appearance: alert, cooperative and appears stated age   Pelvic: External genitalia:  no lesions              Urethra:  normal appearing urethra with no masses, tenderness or lesions              Bartholins and Skenes: normal                 Vagina: normal appearing vagina with normal color and discharge, no lesions              Cervix: no lesions                Bimanual Exam:  Uterus:  normal size, contour, position, consistency, mobility, non-tender              Adnexa: no mass, fullness, tenderness       Pelvic US  Uterus 6.63 x 3.04 x 2.32 cm.  Fibroid 0.62 cm, subserosal.  EMS 3.47 mm. No masses. Cystic area superior to endometrial canal measuring 0.3 x  0.3 cm.  No communication with endometrial canal. Ovaries normal.  No adnexal masses.  No free fluid.  EMB  Consent  done.  Hibiclens prep. Paracervical block with 1% lidocaine, lot [id number], exp 10/2022 Tenaculum to anterior cervical lip.  Pipelle to 6 cm x 2.  Tissue to pathology.  Minimal EBL.  No complications.  Chaperone was present for exam:  Estill Bamberg, CMA.  ASSESSMENT  Postmenopausal bleeding versus perimenopausal bleeding.  Tiny subserosal fibroid.   PLAN  Pelvic ultrasound findings and report reviewed.  Possible causes of bleeding reviewed:   menstruation, atrophy, polyp, abnormal cells.  FU EMB.  Will check FSH and estradiol. Final plan to follow.    An After Visit Summary was printed and given to the patient.  20 min total time was spent for this patient encounter, including preparation, face-to-face counseling with the patient, coordination of care, and documentation of the encounter.

## 2021-05-25 NOTE — Patient Instructions (Signed)
Endometrial Biopsy ?An endometrial biopsy is a procedure to remove tissue samples from the endometrium, which is the lining of the uterus. The tissue that is removed can then be checked under a microscope for disease. ?This procedure is used to diagnose conditions such as endometrial cancer, endometrial tuberculosis, polyps, or other inflammatory conditions. This procedure may also be used to investigate uterine bleeding to determine where you are in your menstrual cycle or how your hormone levels are affecting the lining of the uterus. ?Tell a health care provider about: ?Any allergies you have. ?All medicines you are taking, including vitamins, herbs, eye drops, creams, and over-the-counter medicines. ?Any problems you or family members have had with anesthetic medicines. ?Any blood disorders you have. ?Any surgeries you have had. ?Any medical conditions you have. ?Whether you are pregnant or may be pregnant. ?What are the risks? ?Generally, this is a safe procedure. However, problems may occur, including: ?Bleeding. ?Pelvic infection. ?Puncture of the wall of the uterus with the biopsy device (rare). ?Allergic reactions to medicines. ?What happens before the procedure? ?Keep a record of your menstrual cycles as told by your health care provider. You may need to schedule your procedure for a specific time in your cycle. ?You may want to bring a sanitary pad to wear after the procedure. ?Plan to have someone take you home from the hospital or clinic. ?Ask your health care provider about: ?Changing or stopping your regular medicines. This is especially important if you are taking diabetes medicines, arthritis medicines, or blood thinners. ?Taking medicines such as aspirin and ibuprofen. These medicines can thin your blood. Do not take these medicines unless your health care provider tells you to take them. ?Taking over-the-counter medicines, vitamins, herbs, and supplements. ?What happens during the procedure? ?You  will lie on an exam table with your feet and legs supported as in a pelvic exam. ?Your health care provider will insert an instrument (speculum) into your vagina to see your cervix. ?Your cervix will be cleansed with an antiseptic solution. ?A medicine (local anesthetic) will be used to numb the cervix. ?A forceps instrument (tenaculum) will be used to hold your cervix steady for the biopsy. ?A thin, rod-like instrument (uterine sound) will be inserted through your cervix to determine the length of your uterus and the location where the biopsy sample will be removed. ?A thin, flexible tube (catheter) will be inserted through your cervix and into the uterus. The catheter will be used to collect the biopsy sample from your endometrial tissue. ?The catheter and speculum will then be removed, and the tissue sample will be sent to a lab for examination. ?The procedure may vary among health care providers and hospitals. ?What can I expect after procedure? ?You will rest in a recovery area until you are ready to go home. ?You may have mild cramping and a small amount of vaginal bleeding. This is normal. ?You may have a small amount of vaginal bleeding for a few days. This is normal. ?It is up to you to get the results of your procedure. Ask your health care provider, or the department that is doing the procedure, when your results will be ready. ?Follow these instructions at home: ?Take over-the-counter and prescription medicines only as told by your health care provider. ?Do not douche, use tampons, or have sexual intercourse until your health care provider approves. ?Return to your normal activities as told by your health care provider. Ask your health care provider what activities are safe for you. ?Follow instructions   from your health care provider about any activity restrictions, such as restrictions on strenuous exercise or heavy lifting. ?Keep all follow-up visits. This is important. ?Contact a health care  provider: ?You have heavy bleeding, or bleed for longer than 2 days after the procedure. ?You have bad smelling discharge from your vagina. ?You have a fever or chills. ?You have a burning sensation when urinating or you have difficulty urinating. ?You have severe pain in your lower abdomen. ?Get help right away if you: ?You have severe cramps in your stomach or back. ?You pass large blood clots. ?Your bleeding increases. ?You become weak or light-headed, or you faint or lose consciousness. ?Summary ?An endometrial biopsy is a procedure to remove tissue samples is taken from the endometrium, which is the lining of the uterus. ?The tissue sample that is removed will be checked under a microscope for disease. ?This procedure is used to diagnose conditions such as endometrial cancer, endometrial tuberculosis, polyps, or other inflammatory conditions. ?After the procedure, it is common to have mild cramping and a small amount of vaginal bleeding for a few days. ?Do not douche, use tampons, or have sexual intercourse until your health care provider approves. Ask your health care provider which activities are safe for you. ?This information is not intended to replace advice given to you by your health care provider. Make sure you discuss any questions you have with your health care provider. ?Document Revised: 03/10/2021 Document Reviewed: 01/20/2020 ?Elsevier Patient Education ? 2022 [employer] ? ?

## 2021-05-26 LAB — FOLLICLE STIMULATING HORMONE: FSH: 25.3 m[IU]/mL

## 2021-05-26 LAB — ESTRADIOL: Estradiol: 48 pg/mL

## 2021-05-26 LAB — SURGICAL PATHOLOGY

## 2021-05-27 ENCOUNTER — Other Ambulatory Visit: Payer: Self-pay

## 2021-05-27 ENCOUNTER — Other Ambulatory Visit: Payer: Self-pay | Admitting: Obstetrics and Gynecology

## 2021-05-27 MED ORDER — NORETHINDRONE 0.35 MG PO TABS: 1.0000 | ORAL_TABLET | Freq: Every day | ORAL | 3 refills | Status: DC

## 2021-05-28 ENCOUNTER — Telehealth: Payer: Self-pay | Admitting: *Deleted

## 2021-05-28 NOTE — Telephone Encounter (Signed)
Refill for ortho Micronor 0.35 mg tablet called into pharmacy Rx did not go to pharmacy when refilled yesterday.

## 2021-06-06 ENCOUNTER — Other Ambulatory Visit: Payer: Self-pay | Admitting: Family Medicine

## 2021-07-01 NOTE — Progress Notes (Signed)
55 y.o. G59P0000 Married Caucasian female here for annual exam.    Bleeding 12/27/20 for 5 - 6 days and then again 01/05/21 for 4 days.  She took a Associate Professor late in the early part of June.   Her pelvic US shows a small fibroid and cystic area superior to the endometrial canal that did not communicate with it.  Her EMB showed benign proliferative endometrium.  FSH was 25.3 and E2 48.  She restarted her Micronor.   She did have some bleeding again one month ago.   Has 2 dogs, one blind and one sighted.   PCP:  Penni Homans, MD   No LMP recorded. Patient is perimenopausal.           Sexually active: Yes.    The current method of family planning is oral progesterone-only contraceptive.    Exercising: No.  The patient does not participate in regular exercise at present. Smoker:  no  Health Maintenance: Pap:   05-17-19 Neg, 04-29-16 Neg:Neg HR HPV, 9-15--15 Neg History of abnormal Pap:  no MMG:  02-02-21 Neg/Birads1 Colonoscopy:  12/2020 normal;next 10 years BMD:  06-02-20  Result :Normal--PCP TDaP:  2013 Gardasil:   no HIV:no Hep C:no Screening Labs:  PCP Flu vaccine:  not usually doing it.    reports that she has never smoked. She has never used smokeless tobacco. She reports that she does not drink alcohol and does not use drugs.  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     NON FORMULARY daily. Omega red      norethindrone (ORTHO MICRONOR) 0.35 MG tablet Take 1 tablet (0.35 mg total) by mouth daily. 28 tablet 3   No current facility-administered medications for this visit.    Family History  Problem Relation Age of Onset   Diabetes Mother        type 2   Heart attack Mother 75   Hypertension Mother    Hyperlipidemia Mother    Stroke Mother        ministrokes   Dementia Mother    Heart disease Mother        MI 2005, s/p 3 stents   Congestive Heart Failure Mother    Kidney disease Mother    Heart disease Father    Hyperlipidemia Sister    Pancreatitis Sister    Colon polyps Brother    Diabetes Brother        type 2   Pancreatitis Brother    Colon polyps Brother    Alcohol abuse Brother    Mental illness Brother    Hyperlipidemia Brother    Heart disease Brother        w/CAD s/p 1 stent   Diabetes  Brother        type 2   Heart attack Maternal Grandmother 67   Diabetes Maternal Grandmother        type 2   Hypertension Maternal Grandmother    Hyperlipidemia Maternal Grandmother    Stroke Maternal Grandfather    Heart attack Paternal Grandfather    Heart disease Paternal Grandfather        MI?   Colon cancer Neg Hx    Esophageal cancer Neg Hx    Rectal cancer Neg Hx    Stomach cancer Neg Hx     Review of Systems  All other systems reviewed and are negative.  Exam:   BP 122/76    Pulse 72    Ht 5\' 5"  (1.651 m)    Wt 147 lb (66.7 kg)    SpO2 97%    BMI 24.46 kg/m     General appearance: alert, cooperative and appears stated age Head: normocephalic, without obvious abnormality, atraumatic Neck: no adenopathy, supple, symmetrical, trachea midline and thyroid normal to inspection and palpation Lungs: clear to auscultation bilaterally Breasts: normal appearance, no masses or tenderness, No nipple retraction or dimpling, No nipple discharge or bleeding,  No axillary adenopathy Heart: regular rate and rhythm Abdomen: soft, non-tender; no masses, no organomegaly Extremities: extremities normal, atraumatic, no cyanosis or edema Skin: skin color, texture, turgor normal. No rashes or lesions Lymph nodes: cervical, supraclavicular, and axillary nodes normal. Neurologic: grossly normal  Pelvic: External genitalia:  no lesions              No abnormal inguinal nodes palpated.              Urethra:  normal appearing urethra with no masses, tenderness or lesions              Bartholins and Skenes: normal                 Vagina: normal appearing vagina with normal color and discharge, no lesions              Cervix: no lesions              Pap taken:  HR HPV and reflex pap collected. Bimanual Exam:  Uterus:  normal size, contour, position, consistency, mobility, non-tender              Adnexa: no mass, fullness, tenderness              Rectal exam: yes.  Confirms.              Anus:  normal sphincter tone, no lesions  Chaperone was present for exam:  Estill Bamberg, CMA  Assessment:   Well woman visit with gynecologic exam. Perimenopausal female.   Plan: Mammogram screening discussed. Self breast awareness reviewed. HR HPV and reflex pap.  Guidelines for Calcium, Vitamin D, regular exercise program including cardiovascular and weight bearing exercise. Refill of Micronor for one year.  Labs with PCP. TDap today. Follow up annually and prn.   After visit summary provided.

## 2021-07-13 ENCOUNTER — Other Ambulatory Visit: Payer: Self-pay

## 2021-07-13 ENCOUNTER — Ambulatory Visit (INDEPENDENT_AMBULATORY_CARE_PROVIDER_SITE_OTHER): Payer: 59 | Admitting: Obstetrics and Gynecology

## 2021-07-13 ENCOUNTER — Other Ambulatory Visit (HOSPITAL_COMMUNITY)
Admission: RE | Admit: 2021-07-13 | Discharge: 2021-07-13 | Disposition: A | Discharge status: Home / Self Care | Payer: 59 | Source: Ambulatory Visit | Attending: Obstetrics and Gynecology | Admitting: Obstetrics and Gynecology

## 2021-07-13 ENCOUNTER — Encounter: Payer: Self-pay | Admitting: Obstetrics and Gynecology

## 2021-07-13 VITALS — BP 122/76 | HR 72 | Ht 65.0 in | Wt 147.0 lb

## 2021-07-13 DIAGNOSIS — Z01419 Encounter for gynecological examination (general) (routine) without abnormal findings: Secondary | ICD-10-CM | POA: Diagnosis not present

## 2021-07-13 DIAGNOSIS — Z124 Encounter for screening for malignant neoplasm of cervix: Secondary | ICD-10-CM | POA: Insufficient documentation

## 2021-07-13 DIAGNOSIS — Z23 Encounter for immunization: Secondary | ICD-10-CM

## 2021-07-13 MED ORDER — NORETHINDRONE 0.35 MG PO TABS: 1.0000 | ORAL_TABLET | Freq: Every day | ORAL | 3 refills | Status: DC

## 2021-07-13 NOTE — Patient Instructions (Signed)

## 2021-07-14 LAB — CERVICOVAGINAL ANCILLARY ONLY
Comment: NEGATIVE
High risk HPV: NEGATIVE

## 2021-09-06 ENCOUNTER — Other Ambulatory Visit: Payer: Self-pay | Admitting: Family Medicine

## 2021-12-03 ENCOUNTER — Other Ambulatory Visit: Payer: Self-pay | Admitting: Family Medicine

## 2022-01-04 ENCOUNTER — Encounter: Payer: 59 | Admitting: Family Medicine

## 2022-01-10 ENCOUNTER — Encounter: Payer: 59 | Admitting: Family Medicine

## 2022-02-08 LAB — HM MAMMOGRAPHY

## 2022-02-08 IMAGING — DX DG CERVICAL SPINE COMPLETE 4+V
6 series · 6 of 6 positions shown · non-contrast
Comparison: None.

CLINICAL DATA: Posterior neck pain radiating to the right upper
extremity with worsened symptoms in the past 2 weeks. Fall in
[DATE].

EXAM:
CERVICAL SPINE - COMPLETE 4+ VIEW

[c-spine lat]
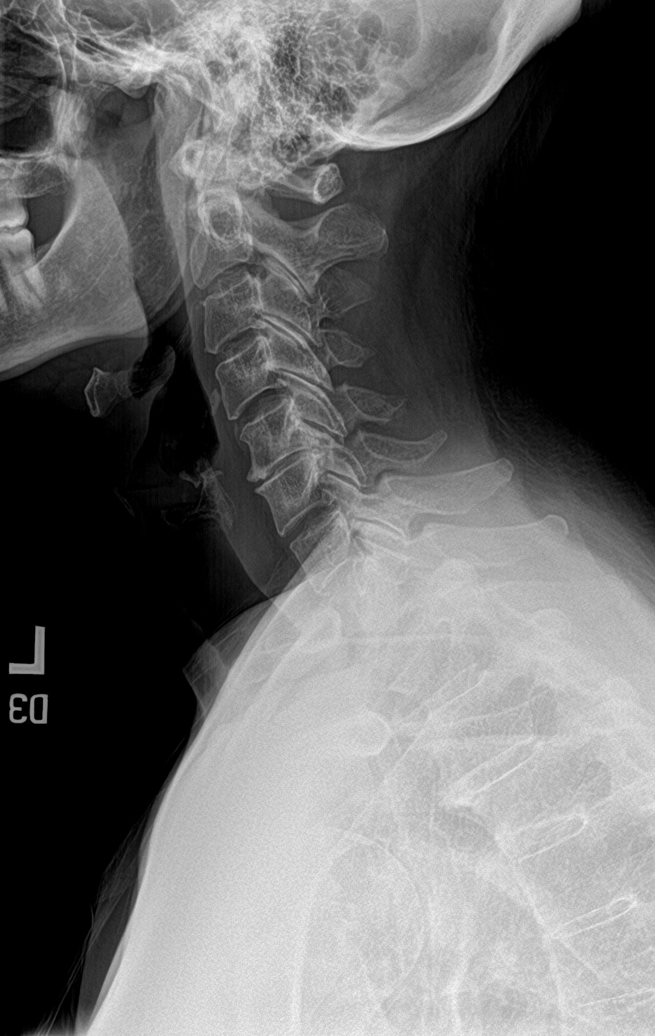

[c-spine obl (1 of 2)]
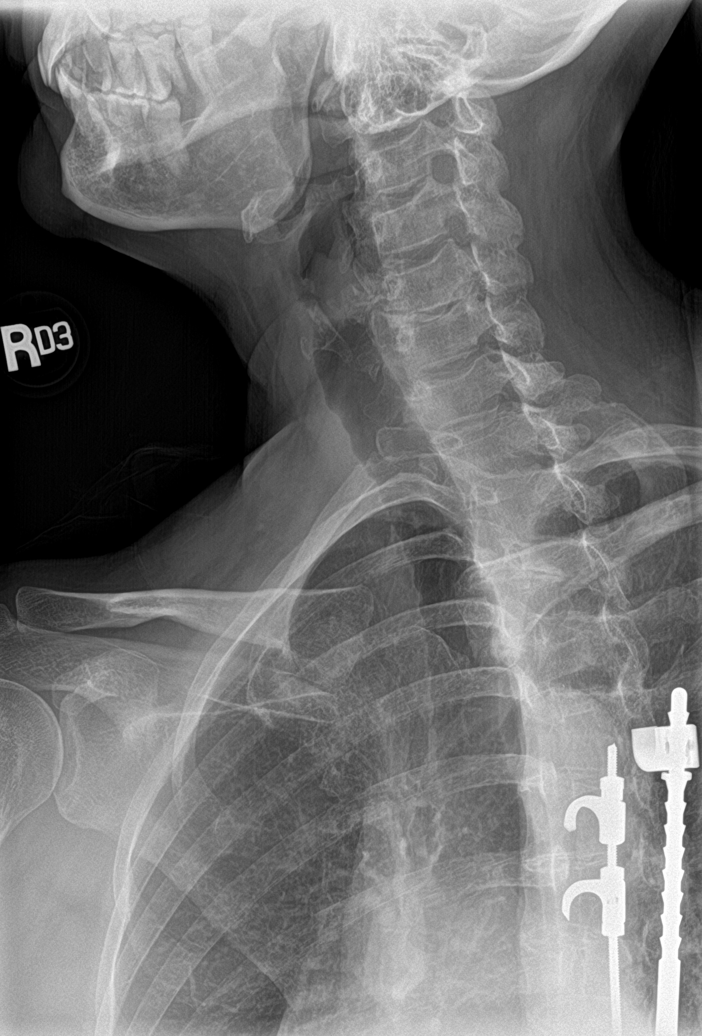

[[person_name]]
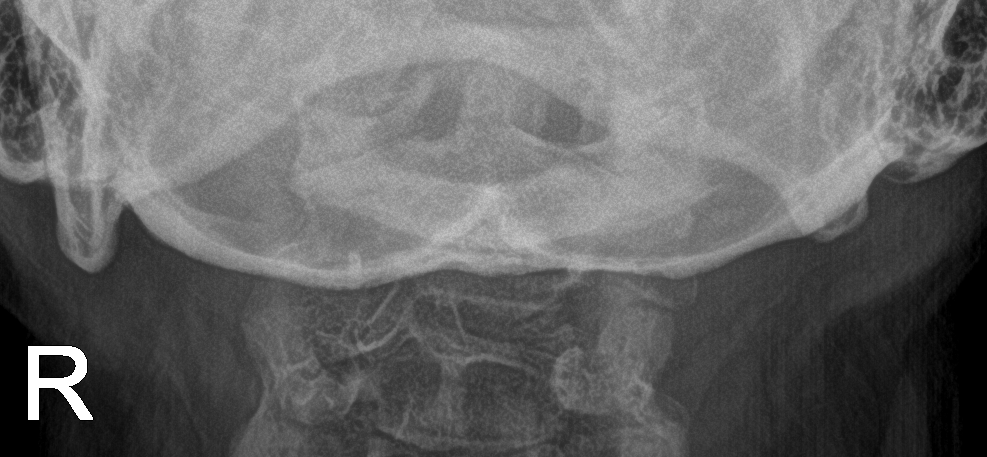

[c-spine obl (2 of 2)]
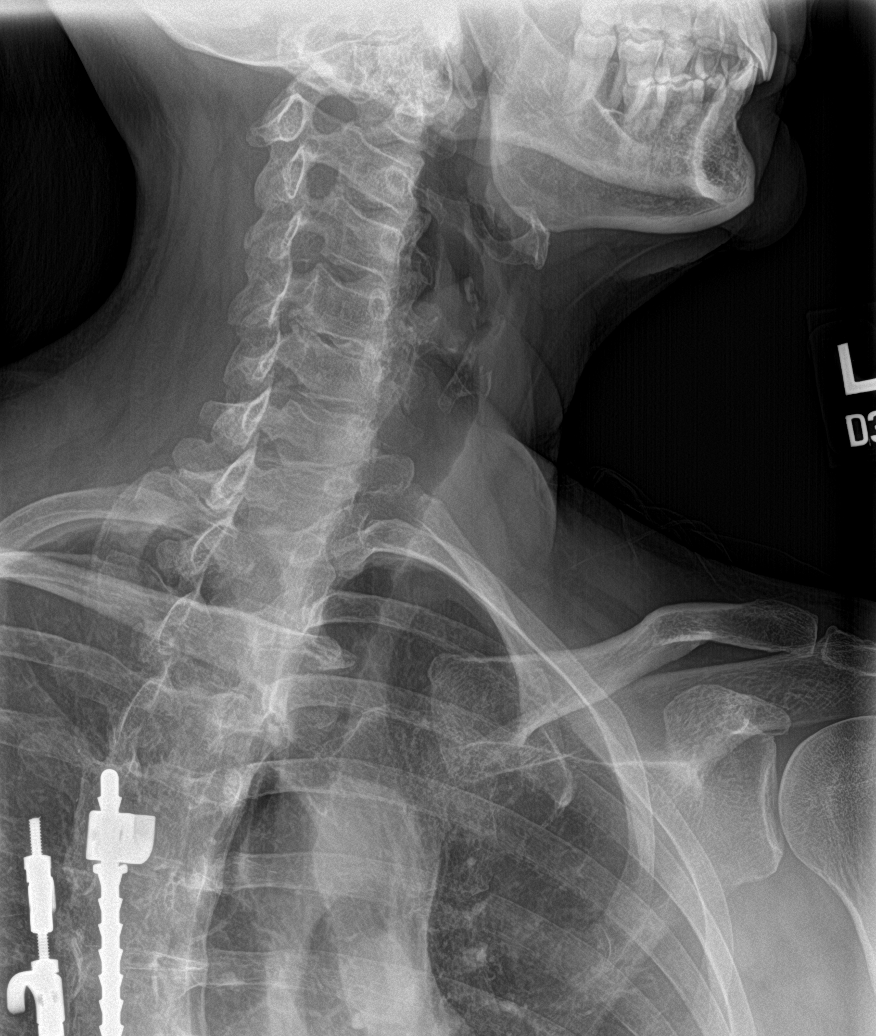

[c-spine ap]
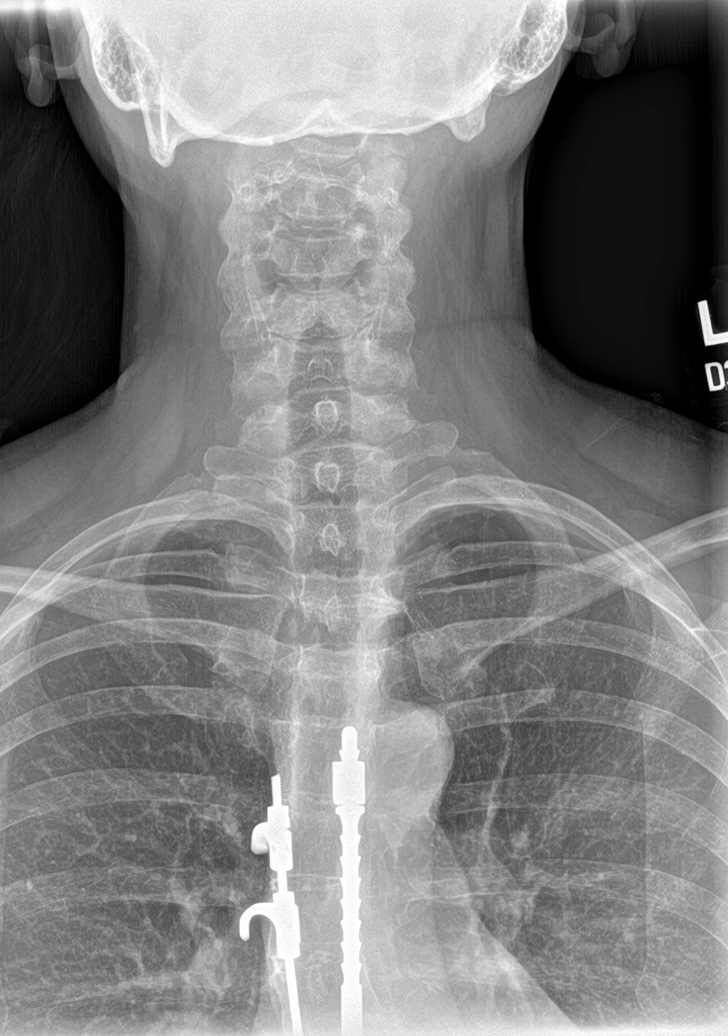

[c-spine open mouth]
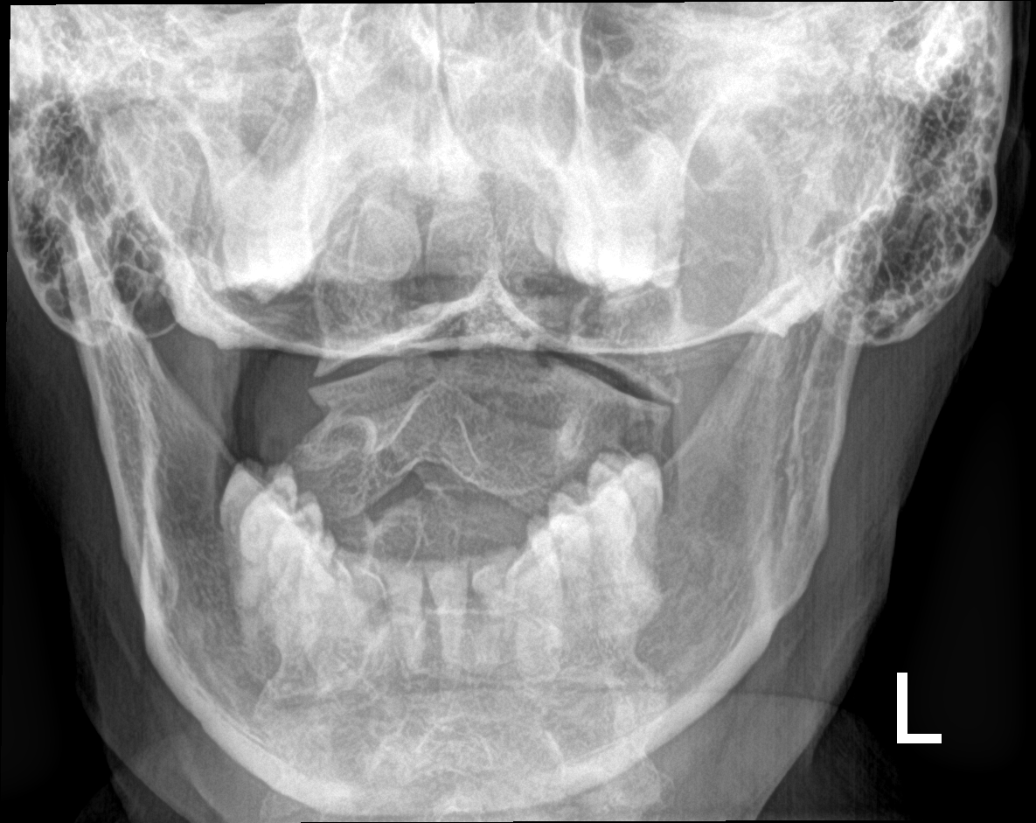

[6 of 6 positions shown; findings below may reference images not displayed]

FINDINGS: There is at most trace retrolisthesis of C5 on C6 which is likely
degenerative. There is minimal right convex curvature of the mid to
upper cervical spine. No fracture is identified. There is moderate
to severe disc space narrowing at C5-6 with endplate spurring and at
least moderate neural foraminal stenosis suspected bilaterally. Disc
space heights are preserved elsewhere. The prevertebral soft tissues
are within normal limits. Spinal rods are partially visualized in
the thoracic spine. The visualized lung apices are clear.
IMPRESSION: Focally advanced disc degeneration at C5-6 with bilateral neural
foraminal stenosis.

## 2022-02-23 ENCOUNTER — Encounter: Payer: Self-pay | Admitting: *Deleted

## 2022-03-03 ENCOUNTER — Other Ambulatory Visit: Payer: Self-pay | Admitting: Family Medicine

## 2022-04-24 NOTE — Assessment & Plan Note (Signed)
Encourage heart healthy diet such as MIND or DASH diet, increase exercise, avoid trans fats, simple carbohydrates and processed foods, consider a krill or fish or flaxseed oil cap daily.  °

## 2022-04-24 NOTE — Assessment & Plan Note (Addendum)
Patient encouraged to maintain heart healthy diet, regular exercise, adequate sleep. Consider daily probiotics. Take medications as prescribed. Labs ordered and reviewed. Shingrix is the new shingles shot, 2 shots over 2-6 months, confirm coverage with insurance and document, then can return here for shots with nurse appt or at pharmacy. Regular flu shot, patient declines COVID shot Colonoscopy 02/01/2021 repeat in 10 years Pap 11/60/2020 repeat this year or next [facility] August 2023, repeat late next year Dexa normal 05/2020 repeat in 2026  Saw dermatology done recently

## 2022-04-24 NOTE — Progress Notes (Unsigned)
Subjective:    Patient ID: Jasmin Taylor, female    DOB: 03-27-1966, 56 y.o.   MRN: [id number]  No chief complaint on file.   HPI Patient is in today for Annual physical exam and follow up on chronic medical concerns. Overall she is doing well. 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   fibroidectomy     hysteroscopic resection  5/04    Family History  Problem Relation Age of Onset   Diabetes Mother        type 2   Heart attack Mother 43   Hypertension Mother    Hyperlipidemia Mother    Stroke Mother        ministrokes   Dementia Mother    Heart disease Mother        MI 2005, s/p 3 stents   Congestive Heart Failure Mother    Kidney disease Mother    Heart disease Father    Hyperlipidemia Sister    Pancreatitis Sister    Colon polyps Brother    Diabetes Brother        type 2   Pancreatitis Brother    Colon polyps Brother    Alcohol abuse Brother    Mental illness Brother    Hyperlipidemia Brother    Heart disease Brother        w/CAD s/p 1 stent   Diabetes Brother        type 2   Heart attack Maternal Grandmother 67   Diabetes Maternal Grandmother        type 2   Hypertension Maternal Grandmother    Hyperlipidemia Maternal Grandmother    Stroke Maternal Grandfather    Heart attack Paternal Grandfather    Heart disease Paternal Grandfather        MI?   Colon cancer Neg Hx    Esophageal cancer Neg Hx    Rectal cancer Neg Hx    Stomach cancer Neg Hx     Social History   Socioeconomic History    Marital status: Married    Spouse name: Not on file   Number of children: Not on file   Years of education: Not on file   Highest education level: Not on file  Occupational History   Not on file  Tobacco Use   Smoking status: Never   Smokeless tobacco: Never  Vaping Use   Vaping Use: Never used  Substance and Sexual Activity   Alcohol use: No   Drug use: No   Sexual activity: Yes    Partners: Male    Birth control/protection: OCP    Comment: [employer]  Other Topics Concern   Not on file  Social History Narrative   No major dietary restrictions   Walks the dogs, 2    Works for the [employer]      Lives with husband   Social Determinants of Radio broadcast assistant Strain: Not on file  Food Insecurity: Not on file  Transportation Needs:  Not on file  Physical Activity: Not on file  Stress: Not on file  Social Connections: Not on file  Intimate Partner Violence: Not on file    Outpatient Medications Prior to Visit  Medication Sig Dispense Refill   meloxicam (MOBIC) 15 MG tablet TAKE 1 TABLET BY MOUTH EVERY DAY AS NEEDED FOR PAIN 30 tablet 2   Multiple Vitamin (MULTIVITAMIN) tablet Take 1 tablet by mouth daily.     NON FORMULARY daily. Omega red      norethindrone (ORTHO MICRONOR) 0.35 MG tablet Take 1 tablet (0.35 mg total) by mouth daily. 84 tablet 3   No facility-administered medications prior to visit.    No Known Allergies  Review of Systems  Constitutional:  Negative for chills, fever and malaise/fatigue.  HENT:  Negative for congestion and hearing loss.   Eyes:  Negative for discharge.  Respiratory:  Negative for cough, sputum production and shortness of breath.   Cardiovascular:  Negative for chest pain, palpitations and leg swelling.  Gastrointestinal:  Negative for abdominal pain, blood in stool, constipation, diarrhea, heartburn, nausea and vomiting.  Genitourinary:  Negative for dysuria, frequency, hematuria and urgency.  Musculoskeletal:   Negative for back pain, falls and myalgias.  Skin:  Negative for rash.  Neurological:  Negative for dizziness, sensory change, loss of consciousness, weakness and headaches.  Endo/Heme/Allergies:  Negative for environmental allergies. Does not bruise/bleed easily.  Psychiatric/Behavioral:  Negative for depression and suicidal ideas. The patient is not nervous/anxious and does not have insomnia.        Objective:    Physical Exam Constitutional:      General: She is not in acute distress.    Appearance: She is well-developed.  HENT:     Head: Normocephalic and atraumatic.  Eyes:     Conjunctiva/sclera: Conjunctivae normal.  Neck:     Thyroid: No thyromegaly.  Cardiovascular:     Rate and Rhythm: Normal rate and regular rhythm.     Heart sounds: Normal heart sounds. No murmur heard. Pulmonary:     Effort: Pulmonary effort is normal. No respiratory distress.     Breath sounds: Normal breath sounds.  Abdominal:     General: Bowel sounds are normal. There is no distension.     Palpations: Abdomen is soft. There is no mass.     Tenderness: There is no abdominal tenderness.  Musculoskeletal:     Cervical back: Neck supple.  Lymphadenopathy:     Cervical: No cervical adenopathy.  Skin:    General: Skin is warm and dry.  Neurological:     Mental Status: She is alert and oriented to person, place, and time.  Psychiatric:        Behavior: Behavior normal.     There were no vitals taken for this visit. Wt Readings from Last 3 Encounters:  07/13/21 147 lb (66.7 kg)  05/25/21 160 lb (72.6 kg)  02/26/21 160 lb (72.6 kg)    Diabetic Foot Exam - Simple   No data filed    Lab Results  Component Value Date   WBC 8.0 12/21/2020   HGB 14.2 12/21/2020   HCT 42.1 12/21/2020   PLT 241.0 12/21/2020   GLUCOSE 82 12/21/2020   CHOL 173 12/21/2020   TRIG 86.0 12/21/2020   HDL 39.70 12/21/2020   LDLCALC 116 (H) 12/21/2020   ALT 18 12/21/2020   AST 15 12/21/2020   NA 140  12/21/2020   K 4.6 12/21/2020   CL 105 12/21/2020   CREATININE 0.72  12/21/2020   BUN 14 12/21/2020   CO2 27 12/21/2020   TSH 0.78 03/05/2020   HGBA1C 5.8 09/02/2019    Lab Results  Component Value Date   TSH 0.78 03/05/2020   Lab Results  Component Value Date   WBC 8.0 12/21/2020   HGB 14.2 12/21/2020   HCT 42.1 12/21/2020   MCV 92.7 12/21/2020   PLT 241.0 12/21/2020   Lab Results  Component Value Date   NA 140 12/21/2020   K 4.6 12/21/2020   CO2 27 12/21/2020   GLUCOSE 82 12/21/2020   BUN 14 12/21/2020   CREATININE 0.72 12/21/2020   BILITOT 0.6 12/21/2020   ALKPHOS 103 12/21/2020   AST 15 12/21/2020   ALT 18 12/21/2020   PROT 6.9 12/21/2020   ALBUMIN 4.4 12/21/2020   CALCIUM 9.4 12/21/2020   GFR 94.41 12/21/2020   Lab Results  Component Value Date   CHOL 173 12/21/2020   Lab Results  Component Value Date   HDL 39.70 12/21/2020   Lab Results  Component Value Date   LDLCALC 116 (H) 12/21/2020   Lab Results  Component Value Date   TRIG 86.0 12/21/2020   Lab Results  Component Value Date   CHOLHDL 4 12/21/2020   Lab Results  Component Value Date   HGBA1C 5.8 09/02/2019       Assessment & Plan:   Problem List Items Addressed This Visit     Hyperlipidemia    Encourage heart healthy diet such as MIND or DASH diet, increase exercise, avoid trans fats, simple carbohydrates and processed foods, consider a krill or fish or flaxseed oil cap daily.       Preventative health care    Patient encouraged to maintain heart healthy diet, regular exercise, adequate sleep. Consider daily probiotics. Take medications as prescribed. Labs ordered and reviewed. Shingrix is the new shingles shot, 2 shots over 2-6 months, confirm coverage with insurance and document, then can return here for shots with nurse appt or at pharmacy. Regular flu shot, patient declines COVID shot Colonoscopy 02/01/2021 repeat in 10 years Pap 11/60/2020 repeat this year or next [facility] August  2023, repeat late next year Dexa normal 05/2020 repeat in 2026       I am having Curt Bears L. Mare Ferrari "Juliann Pulse" maintain her NON FORMULARY, multivitamin, norethindrone, and meloxicam.  No orders of the defined types were placed in this encounter.    Penni Homans, MD

## 2022-04-25 ENCOUNTER — Ambulatory Visit (INDEPENDENT_AMBULATORY_CARE_PROVIDER_SITE_OTHER): Payer: 59 | Admitting: Family Medicine

## 2022-04-25 VITALS — BP 128/72 | HR 62 | Temp 98.0°F | Resp 16 | Ht 65.0 in | Wt 157.8 lb

## 2022-04-25 DIAGNOSIS — Z23 Encounter for immunization: Secondary | ICD-10-CM

## 2022-04-25 DIAGNOSIS — E782 Mixed hyperlipidemia: Secondary | ICD-10-CM | POA: Diagnosis not present

## 2022-04-25 DIAGNOSIS — L659 Nonscarring hair loss, unspecified: Secondary | ICD-10-CM | POA: Diagnosis not present

## 2022-04-25 DIAGNOSIS — Z0000 Encounter for general adult medical examination without abnormal findings: Secondary | ICD-10-CM | POA: Diagnosis not present

## 2022-04-25 NOTE — Patient Instructions (Addendum)
Biotin for hair loss  Multivitamin with minerals, fish oil and vitamin D3 2000 IU daily Try moist heat and stretching to neck and ice to carpal tunnel followed by lidocaine gel daily  [employer] company supplements  Preventive Care 44-56 Years Old, Female Preventive care refers to lifestyle choices and visits with your health care provider that can promote health and wellness. Preventive care visits are also called wellness exams. What can I expect for my preventive care visit? Counseling Your health care provider may ask you questions about your: Medical history, including: Past medical problems. Family medical history. Pregnancy history. Current health, including: Menstrual cycle. Method of birth control. Emotional well-being. Home life and relationship well-being. Sexual activity and sexual health. Lifestyle, including: Alcohol, nicotine or tobacco, and drug use. Access to firearms. Diet, exercise, and sleep habits. Work and work Statistician. Sunscreen use. Safety issues such as seatbelt and bike helmet use. Physical exam Your health care provider will check your: Height and weight. These may be used to calculate your BMI (body mass index). BMI is a measurement that tells if you are at a healthy weight. Waist circumference. This measures the distance around your waistline. This measurement also tells if you are at a healthy weight and may help predict your risk of certain diseases, such as type 2 diabetes and high blood pressure. Heart rate and blood pressure. Body temperature. Skin for abnormal spots. What immunizations do I need?  Vaccines are usually given at various ages, according to a schedule. Your health care provider will recommend vaccines for you based on your age, medical history, and lifestyle or other factors, such as travel or where you work. What tests do I need? Screening Your health care provider may recommend screening tests for certain conditions. This may  include: Lipid and cholesterol levels. Diabetes screening. This is done by checking your blood sugar (glucose) after you have not eaten for a while (fasting). Pelvic exam and Pap test. Hepatitis B test. Hepatitis C test. HIV (human immunodeficiency virus) test. STI (sexually transmitted infection) testing, if you are at risk. Lung cancer screening. Colorectal cancer screening. Mammogram. Talk with your health care provider about when you should start having regular mammograms. This may depend on whether you have a family history of breast cancer. BRCA-related cancer screening. This may be done if you have a family history of breast, ovarian, tubal, or peritoneal cancers. Bone density scan. This is done to screen for osteoporosis. Talk with your health care provider about your test results, treatment options, and if necessary, the need for more tests. Follow these instructions at home: Eating and drinking  Eat a diet that includes fresh fruits and vegetables, whole grains, lean protein, and low-fat dairy products. Take vitamin and mineral supplements as recommended by your health care provider. Do not drink alcohol if: Your health care provider tells you not to drink. You are pregnant, may be pregnant, or are planning to become pregnant. If you drink alcohol: Limit how much you have to 0-1 drink a day. Know how much alcohol is in your drink. In the [place] one drink equals one 12 oz bottle of beer (355 mL), one 5 oz glass of wine (148 mL), or one 1 oz glass of hard liquor (44 mL). Lifestyle Brush your teeth every morning and night with fluoride toothpaste. Floss one time each day. Exercise for at least 30 minutes 5 or more days each week. Do not use any products that contain nicotine or tobacco. These products include cigarettes, chewing  tobacco, and vaping devices, such as e-cigarettes. If you need help quitting, ask your health care provider. Do not use drugs. If you are sexually  active, practice safe sex. Use a condom or other form of protection to prevent STIs. If you do not wish to become pregnant, use a form of birth control. If you plan to become pregnant, see your health care provider for a prepregnancy visit. Take aspirin only as told by your health care provider. Make sure that you understand how much to take and what form to take. Work with your health care provider to find out whether it is safe and beneficial for you to take aspirin daily. Find healthy ways to manage stress, such as: Meditation, yoga, or listening to music. Journaling. Talking to a trusted person. Spending time with friends and family. Minimize exposure to UV radiation to reduce your risk of skin cancer. Safety Always wear your seat belt while driving or riding in a vehicle. Do not drive: If you have been drinking alcohol. Do not ride with someone who has been drinking. When you are tired or distracted. While texting. If you have been using any mind-altering substances or drugs. Wear a helmet and other protective equipment during sports activities. If you have firearms in your house, make sure you follow all gun safety procedures. Seek help if you have been physically or sexually abused. What's next? Visit your health care provider once a year for an annual wellness visit. Ask your health care provider how often you should have your eyes and teeth checked. Stay up to date on all vaccines. This information is not intended to replace advice given to you by your health care provider. Make sure you discuss any questions you have with your health care provider. Document Revised: 12/23/2020 Document Reviewed: 12/23/2020 Elsevier Patient Education  [place].

## 2022-04-26 LAB — CBC WITH DIFFERENTIAL/PLATELET
Basophils Absolute: 0.1 10*3/uL (ref 0.0–0.1)
Basophils Relative: 0.8 % (ref 0.0–3.0)
Eosinophils Absolute: 0.2 10*3/uL (ref 0.0–0.7)
Eosinophils Relative: 2.2 % (ref 0.0–5.0)
HCT: 41.3 % (ref 36.0–46.0)
Hemoglobin: 13.8 g/dL (ref 12.0–15.0)
Lymphocytes Relative: 21.6 % (ref 12.0–46.0)
Lymphs Abs: 1.7 10*3/uL (ref 0.7–4.0)
MCHC: 33.3 g/dL (ref 30.0–36.0)
MCV: 93.8 fl (ref 78.0–100.0)
Monocytes Absolute: 0.4 10*3/uL (ref 0.1–1.0)
Monocytes Relative: 5.4 % (ref 3.0–12.0)
Neutro Abs: 5.6 10*3/uL (ref 1.4–7.7)
Neutrophils Relative %: 70 % (ref 43.0–77.0)
Platelets: 264 10*3/uL (ref 150.0–400.0)
RBC: 4.4 Mil/uL (ref 3.87–5.11)
RDW: 13 % (ref 11.5–15.5)
WBC: 8 10*3/uL (ref 4.0–10.5)

## 2022-04-26 LAB — COMPREHENSIVE METABOLIC PANEL
ALT: 21 U/L (ref 0–35)
AST: 18 U/L (ref 0–37)
Albumin: 4.6 g/dL (ref 3.5–5.2)
Alkaline Phosphatase: 108 U/L (ref 39–117)
BUN: 19 mg/dL (ref 6–23)
CO2: 27 mEq/L (ref 19–32)
Calcium: 9.7 mg/dL (ref 8.4–10.5)
Chloride: 103 mEq/L (ref 96–112)
Creatinine, Ser: 0.68 mg/dL (ref 0.40–1.20)
GFR: 97.42 mL/min (ref >60.00)
Glucose, Bld: 81 mg/dL (ref 70–99)
Potassium: 4.2 mEq/L (ref 3.5–5.1)
Sodium: 139 mEq/L (ref 135–145)
Total Bilirubin: 0.5 mg/dL (ref 0.2–1.2)
Total Protein: 7.3 g/dL (ref 6.0–8.3)

## 2022-04-26 LAB — VITAMIN D 25 HYDROXY (VIT D DEFICIENCY, FRACTURES): VITD: 37.05 ng/mL (ref 30.00–100.00)

## 2022-04-26 LAB — TSH: TSH: 0.85 u[IU]/mL (ref 0.35–5.50)

## 2022-04-26 LAB — LIPID PANEL
Cholesterol: 207 mg/dL — ABNORMAL HIGH (ref 0–200)
HDL: 54.2 mg/dL (ref >39.00)
LDL Cholesterol: 129 mg/dL — ABNORMAL HIGH (ref 0–99)
NonHDL: 152.9
Total CHOL/HDL Ratio: 4
Triglycerides: 121 mg/dL (ref 0.0–149.0)
VLDL: 24.2 mg/dL (ref 0.0–40.0)

## 2022-04-26 LAB — FERRITIN: Ferritin: 100.5 ng/mL (ref 10.0–291.0)

## 2022-06-07 ENCOUNTER — Other Ambulatory Visit: Payer: Self-pay | Admitting: Family Medicine

## 2022-07-14 ENCOUNTER — Ambulatory Visit: Payer: 59 | Admitting: Obstetrics and Gynecology

## 2022-07-19 ENCOUNTER — Telehealth: Payer: Self-pay | Admitting: Family Medicine

## 2022-07-19 NOTE — Telephone Encounter (Signed)
Got form has it in [facility] to sign and will call when ready

## 2022-07-19 NOTE — Telephone Encounter (Signed)
Patient brought in form to be filled out by blyth Placed in blyth bin up front  Patient would like to be called when its ready to be picked up

## 2022-07-21 DIAGNOSIS — Z0279 Encounter for issue of other medical certificate: Secondary | ICD-10-CM

## 2022-07-25 NOTE — Telephone Encounter (Signed)
Called pt lvm regarding Dvm paper was ready  For pickup copy of form at front desk.

## 2022-07-30 ENCOUNTER — Other Ambulatory Visit: Payer: Self-pay | Admitting: Obstetrics and Gynecology

## 2022-07-30 DIAGNOSIS — N926 Irregular menstruation, unspecified: Secondary | ICD-10-CM

## 2022-08-01 NOTE — Telephone Encounter (Signed)
Last AEX 07/13/2021--scheduled for 08/16/2022. Last mammo 02/08/2022-neg birads 1  Last fill per pharmacy on 05/08/2022

## 2022-08-03 NOTE — Progress Notes (Signed)
57 y.o. G37P0000 Married Caucasian female here for annual exam.    No menses.  Not hot flashes or night sweats.   Taking POPs.  Last pill was last night.   PCP:   Gwyneth Revels, MD  No LMP recorded. Patient is perimenopausal.           Sexually active: Yes.    The current method of family planning is POPs.  Exercising: No.   Smoker:  no  Health Maintenance: Pap:  05-17-19 Neg, 04-29-16 Neg:Neg HR History of abnormal Pap:  no MMG:  02-08-22 BI-RADS 1, negative Colonoscopy:   12/2020 normal;next 10 years BMD:   06/02/20  Result  normal--PCP TDaP:  2013 Gardasil:   no HIV: no Hep C: no Screening Labs: PCP   Flu vaccine:  completed.  Covid vaccine:  discussed.    reports that she has never smoked. She has never used smokeless tobacco. She reports that she does not drink alcohol and does not use drugs.  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     NON FORMULARY daily. Omega red      norethindrone (ORTHO MICRONOR) 0.35 MG tablet Take 1 tablet (0.35 mg total) by mouth daily. 84 tablet 3   No current facility-administered medications for this visit.    Family History  Problem Relation Age of Onset   Diabetes Mother        type 2   Heart attack Mother 74   Hypertension Mother     Hyperlipidemia Mother    Stroke Mother        ministrokes   Dementia Mother    Heart disease Mother        MI 2005, s/p 3 stents   Congestive Heart Failure Mother    Kidney disease Mother    Heart disease Father    Hyperlipidemia Sister    Pancreatitis Sister    Colon polyps Brother    Diabetes Brother        type 2   Pancreatitis Brother    Colon polyps Brother    Alcohol abuse Brother    Mental illness Brother    Hyperlipidemia Brother    Heart disease Brother        w/CAD s/p 1 stent   Diabetes Brother        type 2   Heart attack Maternal Grandmother 67   Diabetes Maternal Grandmother        type 2   Hypertension Maternal Grandmother    Hyperlipidemia Maternal Grandmother    Stroke Maternal Grandfather    Heart attack Paternal Grandfather    Heart disease Paternal Grandfather        MI?   Colon cancer Neg Hx  Esophageal cancer Neg Hx    Rectal cancer Neg Hx    Stomach cancer Neg Hx     Review of Systems  All other systems reviewed and are negative.   Exam:   There were no vitals taken for this visit.    General appearance: alert, cooperative and appears stated age Head: normocephalic, without obvious abnormality, atraumatic Neck: no adenopathy, supple, symmetrical, trachea midline and thyroid normal to inspection and palpation Lungs: clear to auscultation bilaterally Breasts: normal appearance, no masses or tenderness, No nipple retraction or dimpling, No nipple discharge or bleeding, No axillary adenopathy Heart: regular rate and rhythm Abdomen: soft, non-tender; no masses, no organomegaly Extremities: extremities normal, atraumatic, no cyanosis or edema Skin: skin color, texture, turgor normal. No rashes or lesions Lymph nodes: cervical, supraclavicular, and axillary nodes normal. Neurologic: grossly normal  Pelvic: External genitalia:  no lesions              No abnormal inguinal nodes palpated.              Urethra:  normal appearing urethra with no  masses, tenderness or lesions              Bartholins and Skenes: normal                 Vagina: normal appearing vagina with normal color and discharge, no lesions              Cervix: no lesions              Pap taken: yes Bimanual Exam:  Uterus:  normal size, contour, position, consistency, mobility, non-tender              Adnexa: no mass, fullness, tenderness              Rectal exam: yes.  Confirms.              Anus:  normal sphincter tone, no lesions  Chaperone was present for exam:  Santiago Glad  Assessment:   Well woman visit with gynecologic exam. Amenorrhea.  Perimenopausal by prior FSH, estradiol. Cervical cancer screening.   Plan: Mammogram screening discussed. Self breast awareness reviewed. Pap and HR HPV collected. Guidelines for Calcium, Vitamin D, regular exercise program including cardiovascular and weight bearing exercise. She will stop her POPs for 2 weeks and then come if for a lab visit to check [facility] and estradiol.  She will use condoms with intercourse in the mean time.  Follow up annually and prn.   After visit summary provided.

## 2022-08-16 ENCOUNTER — Ambulatory Visit (INDEPENDENT_AMBULATORY_CARE_PROVIDER_SITE_OTHER): Payer: 59 | Admitting: Obstetrics and Gynecology

## 2022-08-16 ENCOUNTER — Other Ambulatory Visit (HOSPITAL_COMMUNITY)
Admission: RE | Admit: 2022-08-16 | Discharge: 2022-08-16 | Disposition: A | Discharge status: Home / Self Care | Payer: 59 | Source: Ambulatory Visit | Attending: Obstetrics and Gynecology | Admitting: Obstetrics and Gynecology

## 2022-08-16 ENCOUNTER — Encounter: Payer: Self-pay | Admitting: Obstetrics and Gynecology

## 2022-08-16 VITALS — BP 124/82 | HR 90 | Ht 64.5 in | Wt 158.0 lb

## 2022-08-16 DIAGNOSIS — Z124 Encounter for screening for malignant neoplasm of cervix: Secondary | ICD-10-CM

## 2022-08-16 DIAGNOSIS — N912 Amenorrhea, unspecified: Secondary | ICD-10-CM

## 2022-08-16 DIAGNOSIS — Z01419 Encounter for gynecological examination (general) (routine) without abnormal findings: Secondary | ICD-10-CM | POA: Diagnosis not present

## 2022-08-16 NOTE — Patient Instructions (Signed)

## 2022-08-18 LAB — CYTOLOGY - PAP
Adequacy: ABSENT
Comment: NEGATIVE
Diagnosis: NEGATIVE
High risk HPV: NEGATIVE

## 2022-08-29 ENCOUNTER — Telehealth: Payer: Self-pay

## 2022-08-29 NOTE — Telephone Encounter (Signed)
Spoke with patient and informed her. °

## 2022-08-29 NOTE — Telephone Encounter (Signed)
Patient called stating she is due to have labs tomorrow here (estradiol, [place]). She wanted to make Dr. Quincy Simmonds aware that she started new medication, Finasteride 1 mg, on 08/19/22.

## 2022-08-29 NOTE — Telephone Encounter (Signed)
I recommend proceeding with the [facility] and estradiol. Orders are in the chart.  Thanks.

## 2022-08-30 ENCOUNTER — Other Ambulatory Visit: Payer: 59

## 2022-08-30 DIAGNOSIS — N912 Amenorrhea, unspecified: Secondary | ICD-10-CM

## 2022-08-31 LAB — FOLLICLE STIMULATING HORMONE: FSH: 81.2 m[IU]/mL

## 2022-08-31 LAB — ESTRADIOL: Estradiol: 20 pg/mL

## 2022-09-05 ENCOUNTER — Other Ambulatory Visit: Payer: Self-pay | Admitting: Family Medicine

## 2022-10-24 NOTE — Assessment & Plan Note (Signed)
Encourage heart healthy diet such as MIND or DASH diet, increase exercise, avoid trans fats, simple carbohydrates and processed foods, consider a krill or fish or flaxseed oil cap daily.  °

## 2022-10-25 ENCOUNTER — Ambulatory Visit: Payer: 59 | Admitting: Family Medicine

## 2022-10-25 VITALS — BP 124/84 | HR 74 | Temp 98.2°F | Resp 16 | Ht 65.0 in | Wt 157.6 lb

## 2022-10-25 DIAGNOSIS — E782 Mixed hyperlipidemia: Secondary | ICD-10-CM

## 2022-10-25 DIAGNOSIS — F4321 Adjustment disorder with depressed mood: Secondary | ICD-10-CM

## 2022-10-25 DIAGNOSIS — E162 Hypoglycemia, unspecified: Secondary | ICD-10-CM

## 2022-10-25 DIAGNOSIS — Z23 Encounter for immunization: Secondary | ICD-10-CM | POA: Diagnosis not present

## 2022-10-25 DIAGNOSIS — R232 Flushing: Secondary | ICD-10-CM | POA: Diagnosis not present

## 2022-10-25 DIAGNOSIS — R7989 Other specified abnormal findings of blood chemistry: Secondary | ICD-10-CM

## 2022-10-25 NOTE — Progress Notes (Unsigned)
Subjective:    Patient ID: Jasmin Taylor, female    DOB: [birth year], 57 y.o.   MRN: [id number]  No chief complaint on file.   HPI Patient is in today for follow up on chronic medical concerns. No recent febrile illness or hospitalizations. Denies CP/palp/SOB/HA/congestion/fevers/GI or GU c/o. 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   fibroidectomy     hysteroscopic resection  5/04    Family History  Problem Relation Age of Onset   Diabetes Mother        type 2   Heart attack Mother 89   Hypertension Mother    Hyperlipidemia Mother    Stroke Mother        ministrokes   Dementia Mother    Heart disease Mother        MI 2005, s/p 3 stents   Congestive Heart Failure Mother    Kidney disease Mother    Heart disease Father    Hyperlipidemia Sister    Pancreatitis Sister    Colon polyps Brother    Diabetes Brother        type 2   Pancreatitis Brother    Colon polyps Brother    Alcohol abuse Brother    Mental illness Brother    Hyperlipidemia Brother    Heart disease Brother        w/CAD s/p 1 stent   Diabetes Brother        type 2   Heart attack Maternal Grandmother 67   Diabetes Maternal Grandmother        type 2   Hypertension Maternal Grandmother    Hyperlipidemia Maternal Grandmother    Stroke Maternal Grandfather    Heart attack Paternal Grandfather    Heart disease Paternal Grandfather        MI?   Colon cancer Neg Hx    Esophageal cancer Neg Hx    Rectal cancer Neg Hx    Stomach cancer Neg Hx     Social History    Socioeconomic History   Marital status: Married    Spouse name: Not on file   Number of children: Not on file   Years of education: Not on file   Highest education level: 12th grade  Occupational History   Not on file  Tobacco Use   Smoking status: Never    Passive exposure: Past   Smokeless tobacco: Never  Vaping Use   Vaping Use: Never used  Substance and Sexual Activity   Alcohol use: No   Drug use: No   Sexual activity: Yes    Partners: Male    Birth control/protection: OCP    Comment: Micronor  Other Topics Concern   Not on file  Social History Narrative   No major dietary restrictions   Walks the dogs, 2    Works for the [place] [employer]      Lives with husband   Social Determinants of Health   Financial Resource Strain: Low Risk  (10/25/2022)  Overall Financial Resource Strain (CARDIA)    Difficulty of Paying Living Expenses: Not hard at all  Food Insecurity: No Food Insecurity (10/25/2022)   Hunger Vital Sign    Worried About Running Out of Food in the Last Year: Never true    Ran Out of Food in the Last Year: Never true  Transportation Needs: No Transportation Needs (10/25/2022)   PRAPARE - Administrator, Civil Service (Medical): No    Lack of Transportation (Non-Medical): No  Physical Activity: Insufficiently Active (10/25/2022)   Exercise Vital Sign    Days of Exercise per Week: 3 days    Minutes of Exercise per Session: 30 min  Stress: No Stress Concern Present (10/25/2022)   [employer] of Occupational Health - Occupational Stress Questionnaire    Feeling of Stress : Not at all  Social Connections: Moderately Isolated (10/25/2022)   Social Connection and Isolation Panel [NHANES]    Frequency of Communication with Friends and Family: Once a week    Frequency of Social Gatherings with Friends and Family: Once a week    Attends Religious Services: Never    Database administrator or Organizations: Yes    Attends Banker  Meetings: Never    Marital Status: Married  Catering manager Violence: Not on file    Outpatient Medications Prior to Visit  Medication Sig Dispense Refill   Ascorbic Acid (VITAMIN C PO) Take by mouth.     meloxicam (MOBIC) 15 MG tablet TAKE 1 TABLET BY MOUTH EVERY DAY AS NEEDED FOR PAIN 30 tablet 2   Multiple Vitamin (MULTIVITAMIN) tablet Take 1 tablet by mouth daily.     NON FORMULARY daily. Omega red      norethindrone (ORTHO MICRONOR) 0.35 MG tablet Take 1 tablet (0.35 mg total) by mouth daily. 84 tablet 3   TURMERIC PO Take by mouth.     No facility-administered medications prior to visit.    No Known Allergies  Review of Systems  Constitutional:  Negative for fever and malaise/fatigue.  HENT:  Negative for congestion.   Eyes:  Negative for blurred vision.  Respiratory:  Negative for shortness of breath.   Cardiovascular:  Negative for chest pain, palpitations and leg swelling.  Gastrointestinal:  Negative for abdominal pain, blood in stool and nausea.  Genitourinary:  Negative for dysuria and frequency.  Musculoskeletal:  Negative for falls.  Skin:  Negative for rash.  Neurological:  Negative for dizziness, loss of consciousness and headaches.  Endo/Heme/Allergies:  Negative for environmental allergies.  Psychiatric/Behavioral:  Negative for depression. The patient is not nervous/anxious.        Objective:    Physical Exam Constitutional:      General: She is not in acute distress.    Appearance: Normal appearance. She is well-developed. She is not toxic-appearing.  HENT:     Head: Normocephalic and atraumatic.     Right Ear: External ear normal.     Left Ear: External ear normal.     Nose: Nose normal.  Eyes:     General:        Right eye: No discharge.        Left eye: No discharge.     Conjunctiva/sclera: Conjunctivae normal.  Neck:     Thyroid: No thyromegaly.  Cardiovascular:     Rate and Rhythm: Normal rate and regular rhythm.     Heart sounds:  Normal heart sounds. No murmur heard. Pulmonary:     Effort: Pulmonary effort is  normal. No respiratory distress.     Breath sounds: Normal breath sounds.  Abdominal:     General: Bowel sounds are normal.     Palpations: Abdomen is soft.     Tenderness: There is no abdominal tenderness. There is no guarding.  Musculoskeletal:        General: Normal range of motion.     Cervical back: Neck supple.  Lymphadenopathy:     Cervical: No cervical adenopathy.  Skin:    General: Skin is warm and dry.  Neurological:     Mental Status: She is alert and oriented to person, place, and time.  Psychiatric:        Mood and Affect: Mood normal.        Behavior: Behavior normal.        Thought Content: Thought content normal.        Judgment: Judgment normal.     There were no vitals taken for this visit. Wt Readings from Last 3 Encounters:  08/16/22 158 lb (71.7 kg)  04/25/22 157 lb 12.8 oz (71.6 kg)  07/13/21 147 lb (66.7 kg)    Diabetic Foot Exam - Simple   No data filed    Lab Results  Component Value Date   WBC 8.0 04/25/2022   HGB 13.8 04/25/2022   HCT 41.3 04/25/2022   PLT 264.0 04/25/2022   GLUCOSE 81 04/25/2022   CHOL 207 (H) 04/25/2022   TRIG 121.0 04/25/2022   HDL 54.20 04/25/2022   LDLCALC 129 (H) 04/25/2022   ALT 21 04/25/2022   AST 18 04/25/2022   NA 139 04/25/2022   K 4.2 04/25/2022   CL 103 04/25/2022   CREATININE 0.68 04/25/2022   BUN 19 04/25/2022   CO2 27 04/25/2022   TSH 0.85 04/25/2022   HGBA1C 5.8 09/02/2019    Lab Results  Component Value Date   TSH 0.85 04/25/2022   Lab Results  Component Value Date   WBC 8.0 04/25/2022   HGB 13.8 04/25/2022   HCT 41.3 04/25/2022   MCV 93.8 04/25/2022   PLT 264.0 04/25/2022   Lab Results  Component Value Date   NA 139 04/25/2022   K 4.2 04/25/2022   CO2 27 04/25/2022   GLUCOSE 81 04/25/2022   BUN 19 04/25/2022   CREATININE 0.68 04/25/2022   BILITOT 0.5 04/25/2022   ALKPHOS 108 04/25/2022   AST 18  04/25/2022   ALT 21 04/25/2022   PROT 7.3 04/25/2022   ALBUMIN 4.6 04/25/2022   CALCIUM 9.7 04/25/2022   GFR 97.42 04/25/2022   Lab Results  Component Value Date   CHOL 207 (H) 04/25/2022   Lab Results  Component Value Date   HDL 54.20 04/25/2022   Lab Results  Component Value Date   LDLCALC 129 (H) 04/25/2022   Lab Results  Component Value Date   TRIG 121.0 04/25/2022   Lab Results  Component Value Date   CHOLHDL 4 04/25/2022   Lab Results  Component Value Date   HGBA1C 5.8 09/02/2019       Assessment & Plan:  Mixed hyperlipidemia Assessment & Plan: Encourage heart healthy diet such as MIND or DASH diet, increase exercise, avoid trans fats, simple carbohydrates and processed foods, consider a krill or fish or flaxseed oil cap daily.       Danise Edge, MD

## 2022-10-25 NOTE — Patient Instructions (Signed)
Shingrix is the new shingles shot, 2 shots over 2-6 months, confirm coverage with insurance and document, then can return here for shots with nurse appt or at pharmacy 

## 2022-10-26 DIAGNOSIS — R232 Flushing: Secondary | ICD-10-CM | POA: Insufficient documentation

## 2022-10-26 LAB — CBC WITH DIFFERENTIAL/PLATELET
Basophils Absolute: 0.1 10*3/uL (ref 0.0–0.1)
Basophils Relative: 1.1 % (ref 0.0–3.0)
Eosinophils Absolute: 0.2 10*3/uL (ref 0.0–0.7)
Eosinophils Relative: 3 % (ref 0.0–5.0)
HCT: 41.7 % (ref 36.0–46.0)
Hemoglobin: 14 g/dL (ref 12.0–15.0)
Lymphocytes Relative: 20.6 % (ref 12.0–46.0)
Lymphs Abs: 1.7 10*3/uL (ref 0.7–4.0)
MCHC: 33.7 g/dL (ref 30.0–36.0)
MCV: 92.9 fl (ref 78.0–100.0)
Monocytes Absolute: 0.5 10*3/uL (ref 0.1–1.0)
Monocytes Relative: 5.8 % (ref 3.0–12.0)
Neutro Abs: 5.6 10*3/uL (ref 1.4–7.7)
Neutrophils Relative %: 69.5 % (ref 43.0–77.0)
Platelets: 275 10*3/uL (ref 150.0–400.0)
RBC: 4.49 Mil/uL (ref 3.87–5.11)
RDW: 12.6 % (ref 11.5–15.5)
WBC: 8.1 10*3/uL (ref 4.0–10.5)

## 2022-10-26 LAB — LIPID PANEL
Cholesterol: 206 mg/dL — ABNORMAL HIGH (ref 0–200)
HDL: 52.1 mg/dL (ref >39.00)
NonHDL: 153.49
Total CHOL/HDL Ratio: 4
Triglycerides: 211 mg/dL — ABNORMAL HIGH (ref 0.0–149.0)
VLDL: 42.2 mg/dL — ABNORMAL HIGH (ref 0.0–40.0)

## 2022-10-26 LAB — COMPREHENSIVE METABOLIC PANEL
ALT: 25 U/L (ref 0–35)
AST: 19 U/L (ref 0–37)
Albumin: 4.5 g/dL (ref 3.5–5.2)
Alkaline Phosphatase: 115 U/L (ref 39–117)
BUN: 17 mg/dL (ref 6–23)
CO2: 29 mEq/L (ref 19–32)
Calcium: 10.1 mg/dL (ref 8.4–10.5)
Chloride: 103 mEq/L (ref 96–112)
Creatinine, Ser: 0.69 mg/dL (ref 0.40–1.20)
GFR: 96.74 mL/min (ref >60.00)
Glucose, Bld: 83 mg/dL (ref 70–99)
Potassium: 4.3 mEq/L (ref 3.5–5.1)
Sodium: 143 mEq/L (ref 135–145)
Total Bilirubin: 0.5 mg/dL (ref 0.2–1.2)
Total Protein: 7.1 g/dL (ref 6.0–8.3)

## 2022-10-26 LAB — LDL CHOLESTEROL, DIRECT: Direct LDL: 116 mg/dL

## 2022-10-26 LAB — TSH: TSH: 0.81 u[IU]/mL (ref 0.35–5.50)

## 2022-10-26 NOTE — Assessment & Plan Note (Signed)
No complaints of recent episodes. Needs to eat a protein with each carb and every 4-5 hours.

## 2022-10-26 NOTE — Assessment & Plan Note (Signed)
Doing well with support of friends and family

## 2022-10-26 NOTE — Assessment & Plan Note (Signed)
She is experiencing these off hormones. Encouraged adequate sleep and exercise, stress management and a heart healthy diet focusing on low carbs and high protein. Can consider other meds in future as needed.

## 2022-12-13 ENCOUNTER — Other Ambulatory Visit: Payer: Self-pay | Admitting: Family Medicine

## 2023-02-13 LAB — HM MAMMOGRAPHY

## 2023-02-14 ENCOUNTER — Encounter: Payer: Self-pay | Admitting: Family Medicine

## 2023-03-10 ENCOUNTER — Other Ambulatory Visit: Payer: Self-pay | Admitting: Family Medicine

## 2023-04-30 NOTE — Assessment & Plan Note (Signed)
Encourage heart healthy diet such as MIND or DASH diet, increase exercise, avoid trans fats, simple carbohydrates and processed foods, consider a krill or fish or flaxseed oil cap daily.  °

## 2023-04-30 NOTE — Assessment & Plan Note (Signed)
Patient encouraged to maintain heart healthy diet, regular exercise, adequate sleep. Consider daily probiotics. Take medications as prescribed. Labs ordered and reviewed. Colonoscopy 02/01/2021 repeat in 10 years Pap 11/60/2020 repeat this year or next [facility] August 2024, repeat late next year Dexa normal 05/2020 repeat in 2026 Covid declined Shingrix #2 today Flu shot in next 2-3 years

## 2023-05-02 ENCOUNTER — Ambulatory Visit (INDEPENDENT_AMBULATORY_CARE_PROVIDER_SITE_OTHER): Payer: 59 | Admitting: Family Medicine

## 2023-05-02 ENCOUNTER — Encounter: Payer: Self-pay | Admitting: Family Medicine

## 2023-05-02 VITALS — BP 120/80 | HR 88 | Temp 98.0°F | Resp 18 | Ht 65.0 in | Wt 151.4 lb

## 2023-05-02 DIAGNOSIS — M25562 Pain in left knee: Secondary | ICD-10-CM

## 2023-05-02 DIAGNOSIS — Z0000 Encounter for general adult medical examination without abnormal findings: Secondary | ICD-10-CM | POA: Diagnosis not present

## 2023-05-02 DIAGNOSIS — M25561 Pain in right knee: Secondary | ICD-10-CM

## 2023-05-02 DIAGNOSIS — E162 Hypoglycemia, unspecified: Secondary | ICD-10-CM

## 2023-05-02 DIAGNOSIS — Z23 Encounter for immunization: Secondary | ICD-10-CM

## 2023-05-02 DIAGNOSIS — G8929 Other chronic pain: Secondary | ICD-10-CM

## 2023-05-02 DIAGNOSIS — E782 Mixed hyperlipidemia: Secondary | ICD-10-CM | POA: Diagnosis not present

## 2023-05-02 MED ORDER — COQ10 100 MG PO CAPS: 100.0000 mg | ORAL_CAPSULE | Freq: Every day | ORAL | Status: AC

## 2023-05-02 NOTE — Progress Notes (Signed)
Subjective:    Patient ID: Jasmin Taylor, female    DOB: 24-Feb-1966, 57 y.o.   MRN: [id number]  Chief Complaint  Patient presents with  . Annual Exam    HPI Discussed the use of AI scribe software for clinical note transcription with the patient, who gave verbal consent to proceed.  History of Present Illness   The patient, with a history of back surgery and joint pain, presents for a routine check-up. They report no new or pressing health issues. The patient mentions a sensation of their left ear being clogged, but denies any associated pain, discharge, or itching. The patient continues to experience joint pain, particularly in the knees and thumbs, which is not a new symptom. The patient has been taking CoQ10 for cholesterol management and inquires about the potential benefits of vitamin D supplementation. The patient's job at the [employer], which involves significant physical activity, may contribute to their joint pain. The patient is up-to-date with most screenings and vaccinations, but needs a second shingles shot. The patient has not received a COVID-19 booster shot.        Past Medical History:  Diagnosis Date  . Benign paroxysmal positional vertigo 08/25/2014  . Cerumen impaction 06/22/2012  . Dysmenorrhea   . Fibroid   . Hyperlipidemia   . Menorrhagia   . Pain of right heel 07/05/2013  . Plantar fasciitis of right foot 07/05/2013  . Preventative health care 08/22/2011  . Tinea pedis 12/02/2011    Past Surgical History:  Procedure Laterality Date  . 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 fibroidectomy    . hysteroscopic resection  5/04    Family History  Problem Relation Age of Onset  . Diabetes Mother        type 2  . Heart attack Mother 44  . Hypertension Mother   . Hyperlipidemia Mother   . Stroke Mother        ministrokes  . Dementia Mother   . Heart disease Mother        MI 2005, s/p 3 stents  . Congestive  Heart Failure Mother   . Kidney disease Mother   . Heart disease Father   . Hyperlipidemia Sister   . Pancreatitis Sister   . Colon polyps Brother   . Diabetes Brother        type 2  . Pancreatitis Brother   . Colon polyps Brother   . Alcohol abuse Brother   . Mental illness Brother   . Hyperlipidemia Brother   . Heart disease Brother        w/CAD s/p 1 stent  . Diabetes Brother        type 2  . Heart attack Maternal Grandmother 67  . Diabetes Maternal Grandmother        type 2  . Hypertension Maternal Grandmother   . Hyperlipidemia Maternal Grandmother   . Stroke Maternal Grandfather   . Heart attack Paternal Grandfather   . Heart disease Paternal Grandfather        MI?  Marland Kitchen Colon cancer Neg Hx   . Esophageal cancer Neg Hx   . Rectal cancer Neg Hx   . Stomach cancer Neg Hx     Social History   Socioeconomic History  . Marital status: Married    Spouse name: Not on file  . Number of children: Not on file  . Years of education: Not on file  . Highest  education level: 12th grade  Occupational History  . Not on file  Tobacco Use  . Smoking status: Never    Passive exposure: Past  . Smokeless tobacco: Never  Vaping Use  . Vaping status: Never Used  Substance and Sexual Activity  . Alcohol use: No  . Drug use: No  . Sexual activity: Yes    Partners: Male    Birth control/protection: OCP    Comment: Micronor  Other Topics Concern  . Not on file  Social History Narrative   No major dietary restrictions   Walks the dogs, 2    Works for the [place] [employer]      Lives with husband   Social Determinants of Health   Financial Resource Strain: Low Risk  (10/25/2022)   Overall Financial Resource Strain (CARDIA)   . Difficulty of Paying Living Expenses: Not hard at all  Food Insecurity: No Food Insecurity (10/25/2022)   Hunger Vital Sign   . Worried About Programme researcher, broadcasting/film/video in the Last Year: Never true   . Ran Out of Food in the Last Year: Never true   Transportation Needs: No Transportation Needs (10/25/2022)   PRAPARE - Transportation   . Lack of Transportation (Medical): No   . Lack of Transportation (Non-Medical): No  Physical Activity: Insufficiently Active (10/25/2022)   Exercise Vital Sign   . Days of Exercise per Week: 3 days   . Minutes of Exercise per Session: 30 min  Stress: No Stress Concern Present (10/25/2022)   [employer] of Occupational Health - Occupational Stress Questionnaire   . Feeling of Stress : Not at all  Social Connections: Moderately Isolated (10/25/2022)   Social Connection and Isolation Panel [NHANES]   . Frequency of Communication with Friends and Family: Once a week   . Frequency of Social Gatherings with Friends and Family: Once a week   . Attends Religious Services: Never   . Active Member of Clubs or Organizations: Yes   . Attends Banker Meetings: Never   . Marital Status: Married  Catering manager Violence: Not on file    Outpatient Medications Prior to Visit  Medication Sig Dispense Refill  . Ascorbic Acid (VITAMIN C PO) Take by mouth.    . finasteride (PROPECIA) 1 MG tablet Take 1 mg by mouth daily.    . meloxicam (MOBIC) 15 MG tablet TAKE 1 TABLET BY MOUTH EVERY DAY AS NEEDED FOR PAIN 30 tablet 2  . Multiple Vitamin (MULTIVITAMIN) tablet Take 1 tablet by mouth daily.    . NON FORMULARY daily. Omega red     . TURMERIC PO Take by mouth.    . norethindrone (ORTHO MICRONOR) 0.35 MG tablet Take 1 tablet (0.35 mg total) by mouth daily. 84 tablet 3   No facility-administered medications prior to visit.    No Known Allergies  Review of Systems  Constitutional:  Negative for chills, fever and malaise/fatigue.  HENT:  Negative for congestion and hearing loss.   Eyes:  Negative for discharge.  Respiratory:  Negative for cough, sputum production and shortness of breath.   Cardiovascular:  Negative for chest pain, palpitations and leg swelling.  Gastrointestinal:  Negative for  abdominal pain, blood in stool, constipation, diarrhea, heartburn, nausea and vomiting.  Genitourinary:  Negative for dysuria, frequency, hematuria and urgency.  Musculoskeletal:  Negative for back pain, falls and myalgias.  Skin:  Negative for rash.  Neurological:  Negative for dizziness, sensory change, loss of consciousness, weakness and headaches.  Endo/Heme/Allergies:  Negative for environmental allergies. Does not bruise/bleed easily.  Psychiatric/Behavioral:  Negative for depression and suicidal ideas. The patient is not nervous/anxious and does not have insomnia.       Objective:    Physical Exam Constitutional:      General: She is not in acute distress.    Appearance: Normal appearance. She is not diaphoretic.  HENT:     Head: Normocephalic and atraumatic.     Right Ear: Tympanic membrane, ear canal and external ear normal.     Left Ear: Tympanic membrane, ear canal and external ear normal.     Nose: Nose normal.     Mouth/Throat:     Mouth: Mucous membranes are moist.     Pharynx: Oropharynx is clear. No oropharyngeal exudate.  Eyes:     General: No scleral icterus.       Right eye: No discharge.        Left eye: No discharge.     Conjunctiva/sclera: Conjunctivae normal.     Pupils: Pupils are equal, round, and reactive to light.  Neck:     Thyroid: No thyromegaly.  Cardiovascular:     Rate and Rhythm: Normal rate and regular rhythm.     Heart sounds: Normal heart sounds. No murmur heard. Pulmonary:     Effort: Pulmonary effort is normal. No respiratory distress.     Breath sounds: Normal breath sounds. No wheezing or rales.  Abdominal:     General: Bowel sounds are normal. There is no distension.     Palpations: Abdomen is soft. There is no mass.     Tenderness: There is no abdominal tenderness.  Musculoskeletal:        General: No tenderness. Normal range of motion.     Cervical back: Normal range of motion and neck supple.  Lymphadenopathy:     Cervical: No  cervical adenopathy.  Skin:    General: Skin is warm and dry.     Findings: No rash.  Neurological:     General: No focal deficit present.     Mental Status: She is alert and oriented to person, place, and time.     Cranial Nerves: No cranial nerve deficit.     Coordination: Coordination normal.     Deep Tendon Reflexes: Reflexes are normal and symmetric. Reflexes normal.  Psychiatric:        Mood and Affect: Mood normal.        Behavior: Behavior normal.        Thought Content: Thought content normal.        Judgment: Judgment normal.   BP 120/80 (BP Location: Right Arm, Patient Position: Sitting, Cuff Size: Normal)   Pulse 88   Temp 98 F (36.7 C) (Oral)   Resp 18   Ht 5\' 5"  (1.651 m)   Wt 151 lb 6.4 oz (68.7 kg)   SpO2 97%   BMI 25.19 kg/m  Wt Readings from Last 3 Encounters:  05/02/23 151 lb 6.4 oz (68.7 kg)  10/25/22 157 lb 9.6 oz (71.5 kg)  08/16/22 158 lb (71.7 kg)    Diabetic Foot Exam - Simple   No data filed    Lab Results  Component Value Date   WBC 8.1 10/25/2022   HGB 14.0 10/25/2022   HCT 41.7 10/25/2022   PLT 275.0 10/25/2022   GLUCOSE 83 10/25/2022   CHOL 206 (H) 10/25/2022   TRIG 211.0 (H) 10/25/2022   HDL 52.10 10/25/2022   LDLDIRECT 116.0 10/25/2022   LDLCALC 129 (  H) 04/25/2022   ALT 25 10/25/2022   AST 19 10/25/2022   NA 143 10/25/2022   K 4.3 10/25/2022   CL 103 10/25/2022   CREATININE 0.69 10/25/2022   BUN 17 10/25/2022   CO2 29 10/25/2022   TSH 0.81 10/25/2022   HGBA1C 5.8 09/02/2019    Lab Results  Component Value Date   TSH 0.81 10/25/2022   Lab Results  Component Value Date   WBC 8.1 10/25/2022   HGB 14.0 10/25/2022   HCT 41.7 10/25/2022   MCV 92.9 10/25/2022   PLT 275.0 10/25/2022   Lab Results  Component Value Date   NA 143 10/25/2022   K 4.3 10/25/2022   CO2 29 10/25/2022   GLUCOSE 83 10/25/2022   BUN 17 10/25/2022   CREATININE 0.69 10/25/2022   BILITOT 0.5 10/25/2022   ALKPHOS 115 10/25/2022   AST 19  10/25/2022   ALT 25 10/25/2022   PROT 7.1 10/25/2022   ALBUMIN 4.5 10/25/2022   CALCIUM 10.1 10/25/2022   GFR 96.74 10/25/2022   Lab Results  Component Value Date   CHOL 206 (H) 10/25/2022   Lab Results  Component Value Date   HDL 52.10 10/25/2022   Lab Results  Component Value Date   LDLCALC 129 (H) 04/25/2022   Lab Results  Component Value Date   TRIG 211.0 (H) 10/25/2022   Lab Results  Component Value Date   CHOLHDL 4 10/25/2022   Lab Results  Component Value Date   HGBA1C 5.8 09/02/2019       Assessment & Plan:  Mixed hyperlipidemia Assessment & Plan: Encourage heart healthy diet such as MIND or DASH diet, increase exercise, avoid trans fats, simple carbohydrates and processed foods, consider a krill or fish or flaxseed oil cap daily.    Orders: -     Comprehensive metabolic panel -     Lipid panel -     TSH  Preventative health care Assessment & Plan: Patient encouraged to maintain heart healthy diet, regular exercise, adequate sleep. Consider daily probiotics. Take medications as prescribed. Labs ordered and reviewed. Colonoscopy 02/01/2021 repeat in 10 years Pap 11/60/2020 repeat this year or next [facility] August 2024, repeat late next year Dexa normal 05/2020 repeat in 2026 Covid declined Shingrix #2 today Flu shot in next 2-3 years    Orders: -     VITAMIN D 25 Hydroxy (Vit-D Deficiency, Fractures)  Hypoglycemia -     Hemoglobin A1c  Chronic pain of both knees -     CBC with Differential/Platelet  Need for shingles vaccine -     Varicella-zoster vaccine IM  Other orders -     CoQ10; Take 100 mg by mouth daily.    Assessment and Plan    Ear Fullness No associated pain, discharge, or hearing changes. Likely secondary to wax or water. No current symptoms. -If symptoms recur, consider re-evaluation or referral to ENT.  Osteoarthritis Reports of stiffness in knees and thumbs, especially upon rising. No significant limitation of  activities. -Encouraged to continue physical activity as tolerated. -Consider use of ice, heat, and topical rubs like Voltaren or Aspercreme for symptom management. -If symptoms worsen, consider imaging or referral to sports medicine or orthopedics.  Supplement Use Taking CoQ10 100mg  daily for cholesterol management. No side effects reported. -Continue CoQ10 as tolerated.  Vitamin D Last checked a year ago. Patient inquiring about supplementation. -Order Vitamin D level with upcoming labs.  General Health Maintenance -Order routine labs. -Second shingles vaccine to be administered  today. -Flu vaccine to be administered in a couple of weeks. -Colonoscopy due in 2032. -Annual dermatology visits recommended starting at age 53. -Next physical scheduled in 1 year.         Danise Edge, MD

## 2023-05-02 NOTE — Patient Instructions (Addendum)
4000, 8000 steps   Netflix Live to 100 the secrets of the [facility] Zones    Preventive Care 57-57 Years Old, Female Preventive care refers to lifestyle choices and visits with your health care provider that can promote health and wellness. Preventive care visits are also called wellness exams. What can I expect for my preventive care visit? Counseling Your health care provider may ask you questions about your: Medical history, including: Past medical problems. Family medical history. Pregnancy history. Current health, including: Menstrual cycle. Method of birth control. Emotional well-being. Home life and relationship well-being. Sexual activity and sexual health. Lifestyle, including: Alcohol, nicotine or tobacco, and drug use. Access to firearms. Diet, exercise, and sleep habits. Work and work Astronomer. Sunscreen use. Safety issues such as seatbelt and bike helmet use. Physical exam Your health care provider will check your: Height and weight. These may be used to calculate your BMI (body mass index). BMI is a measurement that tells if you are at a healthy weight. Waist circumference. This measures the distance around your waistline. This measurement also tells if you are at a healthy weight and may help predict your risk of certain diseases, such as type 2 diabetes and high blood pressure. Heart rate and blood pressure. Body temperature. Skin for abnormal spots. What immunizations do I need?  Vaccines are usually given at various ages, according to a schedule. Your health care provider will recommend vaccines for you based on your age, medical history, and lifestyle or other factors, such as travel or where you work. What tests do I need? Screening Your health care provider may recommend screening tests for certain conditions. This may include: Lipid and cholesterol levels. Diabetes screening. This is done by checking your blood sugar (glucose) after you have not eaten for  a while (fasting). Pelvic exam and Pap test. Hepatitis B test. Hepatitis C test. HIV (human immunodeficiency virus) test. STI (sexually transmitted infection) testing, if you are at risk. Lung cancer screening. Colorectal cancer screening. Mammogram. Talk with your health care provider about when you should start having regular mammograms. This may depend on whether you have a family history of breast cancer. BRCA-related cancer screening. This may be done if you have a family history of breast, ovarian, tubal, or peritoneal cancers. Bone density scan. This is done to screen for osteoporosis. Talk with your health care provider about your test results, treatment options, and if necessary, the need for more tests. Follow these instructions at home: Eating and drinking  Eat a diet that includes fresh fruits and vegetables, whole grains, lean protein, and low-fat dairy products. Take vitamin and mineral supplements as recommended by your health care provider. Do not drink alcohol if: Your health care provider tells you not to drink. You are pregnant, may be pregnant, or are planning to become pregnant. If you drink alcohol: Limit how much you have to 0-1 drink a day. Know how much alcohol is in your drink. In the [place] one drink equals one 12 oz bottle of beer (355 mL), one 5 oz glass of wine (148 mL), or one 1 oz glass of hard liquor (44 mL). Lifestyle Brush your teeth every morning and night with fluoride toothpaste. Floss one time each day. Exercise for at least 30 minutes 5 or more days each week. Do not use any products that contain nicotine or tobacco. These products include cigarettes, chewing tobacco, and vaping devices, such as e-cigarettes. If you need help quitting, ask your health care provider. Do not use  drugs. If you are sexually active, practice safe sex. Use a condom or other form of protection to prevent STIs. If you do not wish to become pregnant, use a form of birth  control. If you plan to become pregnant, see your health care provider for a prepregnancy visit. Take aspirin only as told by your health care provider. Make sure that you understand how much to take and what form to take. Work with your health care provider to find out whether it is safe and beneficial for you to take aspirin daily. Find healthy ways to manage stress, such as: Meditation, yoga, or listening to music. Journaling. Talking to a trusted person. Spending time with friends and family. Minimize exposure to UV radiation to reduce your risk of skin cancer. Safety Always wear your seat belt while driving or riding in a vehicle. Do not drive: If you have been drinking alcohol. Do not ride with someone who has been drinking. When you are tired or distracted. While texting. If you have been using any mind-altering substances or drugs. Wear a helmet and other protective equipment during sports activities. If you have firearms in your house, make sure you follow all gun safety procedures. Seek help if you have been physically or sexually abused. What's next? Visit your health care provider once a year for an annual wellness visit. Ask your health care provider how often you should have your eyes and teeth checked. Stay up to date on all vaccines. This information is not intended to replace advice given to you by your health care provider. Make sure you discuss any questions you have with your health care provider. Document Revised: 12/23/2020 Document Reviewed: 12/23/2020 Elsevier Patient Education  2024 [employer].

## 2023-05-03 LAB — CBC WITH DIFFERENTIAL/PLATELET
Basophils Absolute: 0.1 10*3/uL (ref 0.0–0.1)
Basophils Relative: 1.1 % (ref 0.0–3.0)
Eosinophils Absolute: 0.2 10*3/uL (ref 0.0–0.7)
Eosinophils Relative: 2.4 % (ref 0.0–5.0)
HCT: 41.7 % (ref 36.0–46.0)
Hemoglobin: 13.5 g/dL (ref 12.0–15.0)
Lymphocytes Relative: 19.7 % (ref 12.0–46.0)
Lymphs Abs: 1.6 10*3/uL (ref 0.7–4.0)
MCHC: 32.3 g/dL (ref 30.0–36.0)
MCV: 93.5 fL (ref 78.0–100.0)
Monocytes Absolute: 0.4 10*3/uL (ref 0.1–1.0)
Monocytes Relative: 4.5 % (ref 3.0–12.0)
Neutro Abs: 5.8 10*3/uL (ref 1.4–7.7)
Neutrophils Relative %: 72.3 % (ref 43.0–77.0)
Platelets: 278 10*3/uL (ref 150.0–400.0)
RBC: 4.46 Mil/uL (ref 3.87–5.11)
RDW: 13.2 % (ref 11.5–15.5)
WBC: 8 10*3/uL (ref 4.0–10.5)

## 2023-05-03 LAB — COMPREHENSIVE METABOLIC PANEL
ALT: 22 U/L (ref 0–35)
AST: 20 U/L (ref 0–37)
Albumin: 4.3 g/dL (ref 3.5–5.2)
Alkaline Phosphatase: 111 U/L (ref 39–117)
BUN: 16 mg/dL (ref 6–23)
CO2: 28 meq/L (ref 19–32)
Calcium: 9.7 mg/dL (ref 8.4–10.5)
Chloride: 104 meq/L (ref 96–112)
Creatinine, Ser: 0.64 mg/dL (ref 0.40–1.20)
GFR: 98.15 mL/min (ref >60.00)
Glucose, Bld: 82 mg/dL (ref 70–99)
Potassium: 4.1 meq/L (ref 3.5–5.1)
Sodium: 141 meq/L (ref 135–145)
Total Bilirubin: 0.5 mg/dL (ref 0.2–1.2)
Total Protein: 6.9 g/dL (ref 6.0–8.3)

## 2023-05-03 LAB — LIPID PANEL
Cholesterol: 218 mg/dL — ABNORMAL HIGH (ref 0–200)
HDL: 56.5 mg/dL (ref >39.00)
LDL Cholesterol: 134 mg/dL — ABNORMAL HIGH (ref 0–99)
NonHDL: 161.93
Total CHOL/HDL Ratio: 4
Triglycerides: 139 mg/dL (ref 0.0–149.0)
VLDL: 27.8 mg/dL (ref 0.0–40.0)

## 2023-05-03 LAB — HEMOGLOBIN A1C: Hgb A1c MFr Bld: 5.7 % (ref 4.6–6.5)

## 2023-05-03 LAB — VITAMIN D 25 HYDROXY (VIT D DEFICIENCY, FRACTURES): VITD: 34.34 ng/mL (ref 30.00–100.00)

## 2023-05-03 LAB — TSH: TSH: 0.64 u[IU]/mL (ref 0.35–5.50)

## 2023-06-26 ENCOUNTER — Other Ambulatory Visit: Payer: Self-pay | Admitting: Family Medicine

## 2023-09-22 ENCOUNTER — Other Ambulatory Visit: Payer: Self-pay | Admitting: Family Medicine

## 2023-11-13 ENCOUNTER — Ambulatory Visit (INDEPENDENT_AMBULATORY_CARE_PROVIDER_SITE_OTHER): Payer: 59 | Admitting: Obstetrics and Gynecology

## 2023-11-13 ENCOUNTER — Encounter: Payer: Self-pay | Admitting: Obstetrics and Gynecology

## 2023-11-13 VITALS — BP 118/80 | HR 83 | Ht 65.25 in | Wt 152.0 lb

## 2023-11-13 DIAGNOSIS — Z01419 Encounter for gynecological examination (general) (routine) without abnormal findings: Secondary | ICD-10-CM

## 2023-11-13 DIAGNOSIS — Z1331 Encounter for screening for depression: Secondary | ICD-10-CM | POA: Diagnosis not present

## 2023-11-13 NOTE — Progress Notes (Signed)
 58 y.o. G65P0000 Married Caucasian female here for annual exam.    No periods.  08/30/22 - FSH 81.2.   Some hot flashes.  Managing ok.  Some vaginal dryness.  Not sexually active very often.   Retired in 2023.  Husband is working part time.  Enjoys her dog.  One has special needs.  PCP: Neda Balk, MD   Patient's last menstrual period was 12/27/2020 (approximate).           Sexually active: Yes.    The current method of family planning is Postmenopausal    Menopausal hormone therapy:  n/a Exercising: No.   Smoker:  no  OB History  Gravida Para Term Preterm AB Living  0 0 0 0 0 0  SAB IAB Ectopic Multiple Live Births  0 0 0 0      HEALTH MAINTENANCE: Last 2 paps:  08/16/22 neg, HR HPV neg, 05/17/19 neg  History of abnormal Pap or positive HPV:  no Mammogram:   02/13/23 Breast Comp Cat B, BIRADS Cat 1 neg  Colonoscopy:  02/01/21 - due in 2032. Bone Density:  06/02/20  Result  normal   Immunization History  Administered Date(s) Administered   Influenza Split 04/10/2012   Influenza Whole 05/12/2011, 05/11/2013   Influenza, Quadrivalent, Recombinant, Inj, Pf 06/01/2018   Influenza, Seasonal, Injecte, Preservative Fre 06/15/2014   Influenza,inj,Quad PF,6+ Mos 05/17/2016, 04/25/2022   Influenza-Unspecified 05/09/2017, 06/01/2018, 05/12/2019   Tdap 08/19/2011, 07/13/2021   Zoster Recombinant(Shingrix ) 10/25/2022, 05/02/2023      reports that she has never smoked. She has been exposed to tobacco smoke. She has never used smokeless tobacco. She reports that she does not drink alcohol and does not use drugs.  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     Coenzyme Q10 (COQ10) 100 MG CAPS Take 100 mg by mouth daily.     Cyanocobalamin (VITAMIN B-12 PO) Take by mouth.     finasteride (PROPECIA) 1 MG tablet Take 1 mg by mouth daily.     ketoconazole (NIZORAL) 2 % cream Apply 1 Application topically 2 (two) times daily.     minoxidil (ROGAINE) 2 % external solution Apply topically 2 (two) times a week.     Multiple Vitamin (MULTIVITAMIN) tablet Take 1 tablet by mouth daily.     NON FORMULARY daily. Omega red      TURMERIC PO Take by mouth.     VITAMIN D  PO Take by mouth.     meloxicam  (MOBIC ) 15 MG tablet TAKE 1 TABLET BY MOUTH EVERY DAY AS NEEDED FOR PAIN (Patient not taking: Reported on 11/13/2023) 30 tablet 2   No current facility-administered medications for this visit.    ALLERGIES: Patient has no known allergies.  Family History  Problem Relation Age of Onset   Diabetes Mother  type 2   Heart attack Mother 69   Hypertension Mother    Hyperlipidemia Mother    Stroke Mother        ministrokes   Dementia Mother    Heart disease Mother        MI 2005, s/p 3 stents   Congestive Heart Failure Mother    Kidney disease Mother    Heart disease Father    Hyperlipidemia Sister    Pancreatitis Sister    Colon polyps Brother    Diabetes Brother        type 2   Pancreatitis Brother    Colon polyps Brother    Alcohol abuse Brother    Mental illness Brother    Hyperlipidemia Brother    Heart disease Brother        w/CAD s/p 1 stent   Diabetes Brother        type 2   Heart attack Maternal Grandmother 62   Diabetes Maternal Grandmother        type 2   Hypertension Maternal Grandmother    Hyperlipidemia Maternal Grandmother    Stroke Maternal Grandfather    Heart attack  Paternal Grandfather    Heart disease Paternal Grandfather        MI?   Colon cancer Neg Hx    Esophageal cancer Neg Hx    Rectal cancer Neg Hx    Stomach cancer Neg Hx     Review of Systems  All other systems reviewed and are negative.   PHYSICAL EXAM:  BP 118/80 (BP Location: Left Arm, Patient Position: Sitting)   Pulse 83   Ht 5' 5.25" (1.657 m)   Wt 152 lb (68.9 kg)   LMP 12/27/2020 (Approximate)   SpO2 97%   BMI 25.10 kg/m     General appearance: alert, cooperative and appears stated age Head: normocephalic, without obvious abnormality, atraumatic Neck: no adenopathy, supple, symmetrical, trachea midline and thyroid  normal to inspection and palpation Lungs: clear to auscultation bilaterally Breasts: normal appearance, no masses or tenderness, No nipple retraction or dimpling, No nipple discharge or bleeding, No axillary adenopathy Heart: regular rate and rhythm Abdomen: soft, non-tender; no masses, no organomegaly Extremities: extremities normal, atraumatic, no cyanosis or edema Skin: skin color, texture, turgor normal. No rashes or lesions Lymph nodes: cervical, supraclavicular, and axillary nodes normal. Neurologic: grossly normal  Pelvic: External genitalia:  no lesions              No abnormal inguinal nodes palpated.              Urethra:  normal appearing urethra with no masses, tenderness or lesions              Bartholins and Skenes: normal                 Vagina: normal appearing vagina with normal color and discharge, no lesions              Cervix: no lesions              Pap taken: no Bimanual Exam:  Uterus:  normal size, contour, position, consistency, mobility, non-tender              Adnexa: no mass, fullness, tenderness              Rectal exam: yes.  Confirms.              Anus:  normal sphincter tone, no lesions  Chaperone was  present for exam:   Cottie Diss, CMA  ASSESSMENT: Well woman visit with gynecologic exam. PHQ-9: 0  PLAN: Mammogram  screening discussed. Self breast awareness reviewed. Pap and HRV collected:  no.  Due in 2029.  Guidelines for Calcium, Vitamin D , regular exercise program including cardiovascular and weight bearing exercise. Medication refills:  NA She will do labs with her PCP.  Follow up:  yearly and prn.

## 2023-11-13 NOTE — Patient Instructions (Signed)

## 2024-01-17 ENCOUNTER — Other Ambulatory Visit: Payer: Self-pay | Admitting: Family

## 2024-01-26 NOTE — Telephone Encounter (Unsigned)
 Copied from CRM [id number]. Topic: Clinical - Medication Refill >> Jan 26, 2024  5:49 PM Harlene ORN wrote: Medication: meloxicam  (MOBIC ) 15 MG tablet  Has the patient contacted their pharmacy? Yes (Agent: If no, request that the patient contact the pharmacy for the refill. If patient does not wish to contact the pharmacy document the reason why and proceed with request.) (Agent: If yes, when and what did the pharmacy advise?)  This is the patient's preferred pharmacy:  [id number] - [place], [place] - [address] [place] [address] Phone: [phone] Fax: [phone]  Is this the correct pharmacy for this prescription? Yes If no, delete pharmacy and type the correct one.   Has the prescription been filled recently? No  Is the patient out of the medication? No  Has the patient been seen for an appointment in the last year OR does the patient have an upcoming appointment? Yes  Can we respond through MyChart? Yes  Agent: Please be advised that Rx refills may take up to 3 business days. We ask that you follow-up with your pharmacy.

## 2024-02-19 LAB — HM MAMMOGRAPHY

## 2024-05-07 ENCOUNTER — Encounter: Payer: 59 | Admitting: Family Medicine

## 2024-05-14 ENCOUNTER — Other Ambulatory Visit: Payer: Self-pay | Admitting: Family

## 2024-05-26 NOTE — Progress Notes (Signed)
 Subjective:    Patient ID: Jasmin Taylor, female    DOB: 1966/02/27, 58 y.o.   MRN: [id number]  No chief complaint on file.   HPI Discussed the use of AI scribe software for clinical note transcription with the patient, who gave verbal consent to proceed.  History of Present Illness Jasmin Taylor is a 58 year old female who presents for a routine check-up and evaluation of vitamin D  levels.  She has been monitoring her vitamin D  levels, previously measured at 34 ng/mL. She takes a vitamin D  supplement of 1000 IU daily and is considering adjusting the dose based on recheck results.  She takes finasteride to prevent further hair thinning without complications.  She previously took vitamin B12 but switched to a B complex, which caused her urine to turn bright yellow. She has since stopped the B complex and now takes a multivitamin. There is no known family history of pernicious anemia or vitamin B12 deficiency.  She uses Metamucil for cholesterol management without any bowel issues. No significant changes in bowel habits, no bloody stools, and no abdominal or back pain.  She sometimes struggles with sleep, particularly on Sunday nights due to anticipation of a busy week. She tries to maintain a healthy lifestyle by staying hydrated, getting rest, and being active, although she admits to not being perfect in these areas.  Her older sister passed away last year at the age of 48 due to complications from alcohol abuse, COPD, and heart trouble. She has lost three siblings in total.  She works for the [employer] and tries to maintain a healthy lifestyle by staying hydrated, getting rest, and being active.  No recent illnesses, injuries, joint pain, or changes in bowel habits. No new or worsening symptoms.    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   fibroidectomy     hysteroscopic resection  5/04    Family History  Problem Relation Age of Onset   Diabetes Mother        type 2   Heart attack Mother 9   Hypertension Mother    Hyperlipidemia Mother    Stroke Mother        ministrokes   Dementia Mother    Heart disease Mother        MI 2005, s/p 3 stents   Congestive Heart Failure Mother    Kidney disease Mother    Heart disease Father    Hyperlipidemia Sister    Pancreatitis Sister    Colon polyps Brother    Diabetes Brother        type 2   Pancreatitis Brother    Colon polyps Brother    Alcohol abuse Brother    Mental illness Brother    Hyperlipidemia Brother    Heart disease Brother        w/CAD s/p 1 stent   Diabetes Brother        type 2   Heart attack Maternal Grandmother 67   Diabetes Maternal Grandmother  type 2   Hypertension Maternal Grandmother    Hyperlipidemia Maternal Grandmother    Stroke Maternal Grandfather    Heart attack Paternal Grandfather    Heart disease Paternal Grandfather        MI?   Colon cancer Neg Hx    Esophageal cancer Neg Hx    Rectal cancer Neg Hx    Stomach cancer Neg Hx     Social History   Socioeconomic History   Marital status: Married    Spouse name: Not on file   Number of children: Not on file   Years of education: Not on file   Highest education level: 12th grade  Occupational History   Not on file  Tobacco Use   Smoking status: Never    Passive exposure: Past   Smokeless tobacco: Never  Vaping Use   Vaping status: Never Used  Substance and Sexual Activity   Alcohol use: No   Drug use: No   Sexual activity: Yes    Partners: Male    Birth  control/protection: Post-menopausal    Comment: [employer]   Other Topics Concern   Not on file  Social History Narrative   No major dietary restrictions   Walks the dogs, 2    Works for the [employer]      Lives with husband   Social Drivers of Corporate Investment Banker Strain: Low Risk  (11/05/2023)   Received from [facility]   Overall Financial Resource Strain (CARDIA)    Difficulty of Paying Living Expenses: Not hard at all  Food Insecurity: No Food Insecurity (11/05/2023)   Received from [facility]   Hunger Vital Sign    Within the past 12 months, you worried that your food would run out before you got the money to buy more.: Never true    Within the past 12 months, the food you bought just didn't last and you didn't have money to get more.: Never true  Transportation Needs: No Transportation Needs (11/05/2023)   Received from [facility] - Transportation    Lack of Transportation (Medical): No    Lack of Transportation (Non-Medical): No  Physical Activity: Insufficiently Active (11/05/2023)   Received from [facility]   Exercise Vital Sign    On average, how many days per week do you engage in moderate to strenuous exercise (like a brisk walk)?: 3 days    On average, how many minutes do you engage in exercise at this level?: 40 min  Stress: No Stress Concern Present (11/05/2023)   Received from [facility] - Occupational Stress Questionnaire    Feeling of Stress : Not at all  Social Connections: Socially Integrated (11/05/2023)   Received from [facility]   Social Network    How would you rate your social network (family, work, friends)?: Good participation with social networks  Intimate Partner Violence: Not At Risk (11/05/2023)   Received from [facility]   HITS    Over the last 12 months how often did your partner physically hurt you?: Never    Over the last 12 months how often did your partner  insult you or talk down to you?: Never    Over the last 12 months how often did your partner threaten you with physical harm?: Never    Over the last 12 months how often did your partner scream or curse at you?: Never    Outpatient Medications Prior to  Visit  Medication Sig Dispense Refill   Ascorbic Acid (VITAMIN C PO) Take by mouth.     Coenzyme Q10 (COQ10) 100 MG CAPS Take 100 mg by mouth daily.     Cyanocobalamin (VITAMIN B-12 PO) Take by mouth.     finasteride (PROPECIA) 1 MG tablet Take 1 mg by mouth daily.     ketoconazole (NIZORAL) 2 % cream Apply 1 Application topically 2 (two) times daily.     meloxicam  (MOBIC ) 15 MG tablet TAKE 1 TABLET BY MOUTH EVERY DAY AS NEEDED FOR PAIN 30 tablet 2   minoxidil (ROGAINE) 2 % external solution Apply topically 2 (two) times a week.     Multiple Vitamin (MULTIVITAMIN) tablet Take 1 tablet by mouth daily.     NON FORMULARY daily. Omega red      TURMERIC PO Take by mouth.     VITAMIN D  PO Take by mouth.     No facility-administered medications prior to visit.    No Known Allergies  Review of Systems  Constitutional:  Negative for chills, fever and malaise/fatigue.  HENT:  Negative for congestion and hearing loss.   Eyes:  Negative for discharge.  Respiratory:  Negative for cough, sputum production and shortness of breath.   Cardiovascular:  Negative for chest pain, palpitations and leg swelling.  Gastrointestinal:  Negative for abdominal pain, blood in stool, constipation, diarrhea, heartburn, nausea and vomiting.  Genitourinary:  Negative for dysuria, frequency, hematuria and urgency.  Musculoskeletal:  Negative for back pain, falls and myalgias.  Skin:  Negative for rash.  Neurological:  Negative for dizziness, sensory change, loss of consciousness, weakness and headaches.  Endo/Heme/Allergies:  Negative for environmental allergies. Does not bruise/bleed easily.  Psychiatric/Behavioral:  Negative for depression and suicidal ideas. The  patient is not nervous/anxious and does not have insomnia.        Objective:    Physical Exam Constitutional:      General: She is not in acute distress.    Appearance: Normal appearance. She is not diaphoretic.  HENT:     Head: Normocephalic and atraumatic.     Right Ear: Tympanic membrane, ear canal and external ear normal.     Left Ear: Tympanic membrane, ear canal and external ear normal.     Nose: Nose normal.     Mouth/Throat:     Mouth: Mucous membranes are moist.     Pharynx: Oropharynx is clear. No oropharyngeal exudate.  Eyes:     General: No scleral icterus.       Right eye: No discharge.        Left eye: No discharge.     Conjunctiva/sclera: Conjunctivae normal.     Pupils: Pupils are equal, round, and reactive to light.  Neck:     Thyroid : No thyromegaly.  Cardiovascular:     Rate and Rhythm: Normal rate and regular rhythm.     Heart sounds: Normal heart sounds. No murmur heard. Pulmonary:     Effort: Pulmonary effort is normal. No respiratory distress.     Breath sounds: Normal breath sounds. No wheezing or rales.  Abdominal:     General: Bowel sounds are normal. There is no distension.     Palpations: Abdomen is soft. There is no mass.     Tenderness: There is no abdominal tenderness.  Musculoskeletal:        General: No tenderness. Normal range of motion.     Cervical back: Normal range of motion and neck supple.  Lymphadenopathy:  Cervical: No cervical adenopathy.  Skin:    General: Skin is warm and dry.     Findings: No rash.  Neurological:     General: No focal deficit present.     Mental Status: She is alert and oriented to person, place, and time.     Cranial Nerves: No cranial nerve deficit.     Coordination: Coordination normal.     Deep Tendon Reflexes: Reflexes are normal and symmetric. Reflexes normal.  Psychiatric:        Mood and Affect: Mood normal.        Behavior: Behavior normal.        Thought Content: Thought content normal.         Judgment: Judgment normal.    LMP 12/27/2020 (Approximate)  Wt Readings from Last 3 Encounters:  11/13/23 152 lb (68.9 kg)  05/02/23 151 lb 6.4 oz (68.7 kg)  10/25/22 157 lb 9.6 oz (71.5 kg)    Diabetic Foot Exam - Simple   No data filed    Lab Results  Component Value Date   WBC 8.0 05/02/2023   HGB 13.5 05/02/2023   HCT 41.7 05/02/2023   PLT 278.0 05/02/2023   GLUCOSE 82 05/02/2023   CHOL 218 (H) 05/02/2023   TRIG 139.0 05/02/2023   HDL 56.50 05/02/2023   LDLDIRECT 116.0 10/25/2022   LDLCALC 134 (H) 05/02/2023   ALT 22 05/02/2023   AST 20 05/02/2023   NA 141 05/02/2023   K 4.1 05/02/2023   CL 104 05/02/2023   CREATININE 0.64 05/02/2023   BUN 16 05/02/2023   CO2 28 05/02/2023   TSH 0.64 05/02/2023   HGBA1C 5.7 05/02/2023    Lab Results  Component Value Date   TSH 0.64 05/02/2023   Lab Results  Component Value Date   WBC 8.0 05/02/2023   HGB 13.5 05/02/2023   HCT 41.7 05/02/2023   MCV 93.5 05/02/2023   PLT 278.0 05/02/2023   Lab Results  Component Value Date   NA 141 05/02/2023   K 4.1 05/02/2023   CO2 28 05/02/2023   GLUCOSE 82 05/02/2023   BUN 16 05/02/2023   CREATININE 0.64 05/02/2023   BILITOT 0.5 05/02/2023   ALKPHOS 111 05/02/2023   AST 20 05/02/2023   ALT 22 05/02/2023   PROT 6.9 05/02/2023   ALBUMIN 4.3 05/02/2023   CALCIUM 9.7 05/02/2023   GFR 98.15 05/02/2023   Lab Results  Component Value Date   CHOL 218 (H) 05/02/2023   Lab Results  Component Value Date   HDL 56.50 05/02/2023   Lab Results  Component Value Date   LDLCALC 134 (H) 05/02/2023   Lab Results  Component Value Date   TRIG 139.0 05/02/2023   Lab Results  Component Value Date   CHOLHDL 4 05/02/2023   Lab Results  Component Value Date   HGBA1C 5.7 05/02/2023       Assessment & Plan:  Mixed hyperlipidemia Assessment & Plan: Encourage heart healthy diet such as MIND or DASH diet, increase exercise, avoid trans fats, simple carbohydrates and processed  foods, consider a krill or fish or flaxseed oil cap daily.     Preventative health care Assessment & Plan: Patient encouraged to maintain heart healthy diet, regular exercise, adequate sleep. Consider daily probiotics. Take medications as prescribed. Labs ordered and reviewed. Colonoscopy 02/01/2021 repeat in 10 years Pap 08/2022 with gyn, repeat in 2-4 years MGM August 2025, repeat late next year Dexa normal 05/2020 repeat in 2026  Assessment and Plan Assessment & Plan Adult Wellness Visit Routine adult wellness visit with no new concerns or significant changes in health status. Family history updated with the passing of her oldest sister due to complications from alcohol abuse, COPD, and heart trouble. Preventative screenings are up to date, including a normal colonoscopy in 2022, mammogram in August 2024, and bone density scan in November 2021. Discussed the importance of maintaining a healthy lifestyle and the benefits of regular screenings. - Continue routine annual wellness visits. - Maintain current preventative screenings schedule. - Encouraged healthy lifestyle choices, including hydration, rest, and balanced diet.  Mixed hyperlipidemia She started taking Medic Musil to help with cholesterol levels and bowel health. No significant changes in bowel habits reported. - Continue Medic Musil for cholesterol management. - Encouraged lifestyle modifications to support cholesterol management.  Vitamin D  deficiency, low-normal Vitamin D  level is low-normal at 34 ng/mL. Discussed the importance of maintaining vitamin D  levels within the lower end of the normal range (30-50 ng/mL) to avoid potential adverse effects of high levels. She is currently taking 1000 IU of vitamin D  daily. Discussed the potential need to increase the dose if levels do not improve. - Rechecked vitamin D  levels. - Will consider increasing vitamin D  supplementation to 2000 IU daily if levels remain  low.  Vitamin B12 status monitoring Vitamin B12 status is low-normal. She previously took B12 supplements but discontinued them. Discussed the importance of monitoring B12 levels, especially given her low-normal status. - Rechecked vitamin B12 levels. - Recommended a high-quality multivitamin with minerals.  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 No new changes in pain levels or symptoms reported.  Recording duration: 25 minutes     Harlene Horton, MD

## 2024-05-26 NOTE — Assessment & Plan Note (Signed)
 Encourage heart healthy diet such as MIND or DASH diet, increase exercise, avoid trans fats, simple carbohydrates and processed foods, consider a krill or fish or flaxseed oil cap daily.

## 2024-05-26 NOTE — Assessment & Plan Note (Signed)
 Patient encouraged to maintain heart healthy diet, regular exercise, adequate sleep. Consider daily probiotics. Take medications as prescribed. Labs ordered and reviewed. Colonoscopy 02/01/2021 repeat in 10 years Pap 08/2022 with gyn, repeat in 2-4 years MGM August 2025, repeat late next year Dexa normal 05/2020 repeat in 2026

## 2024-05-27 ENCOUNTER — Ambulatory Visit (INDEPENDENT_AMBULATORY_CARE_PROVIDER_SITE_OTHER): Payer: 59 | Admitting: Family Medicine

## 2024-05-27 ENCOUNTER — Encounter: Payer: Self-pay | Admitting: Family Medicine

## 2024-05-27 VITALS — BP 126/82 | HR 83 | Temp 98.4°F | Resp 16 | Ht 65.25 in | Wt 158.8 lb

## 2024-05-27 DIAGNOSIS — E162 Hypoglycemia, unspecified: Secondary | ICD-10-CM

## 2024-05-27 DIAGNOSIS — M549 Dorsalgia, unspecified: Secondary | ICD-10-CM

## 2024-05-27 DIAGNOSIS — E782 Mixed hyperlipidemia: Secondary | ICD-10-CM

## 2024-05-27 DIAGNOSIS — G8929 Other chronic pain: Secondary | ICD-10-CM

## 2024-05-27 DIAGNOSIS — R7989 Other specified abnormal findings of blood chemistry: Secondary | ICD-10-CM

## 2024-05-27 DIAGNOSIS — Z0000 Encounter for general adult medical examination without abnormal findings: Secondary | ICD-10-CM

## 2024-05-27 NOTE — Patient Instructions (Signed)
 Preventive Care 58-58 Years Old, Female  Preventive care refers to lifestyle choices and visits with your health care provider that can promote health and wellness. Preventive care visits are also called wellness exams.  What can I expect for my preventive care visit?  Counseling  Your health care provider may ask you questions about your:  Medical history, including:  Past medical problems.  Family medical history.  Pregnancy history.  Current health, including:  Menstrual cycle.  Method of birth control.  Emotional well-being.  Home life and relationship well-being.  Sexual activity and sexual health.  Lifestyle, including:  Alcohol, nicotine or tobacco, and drug use.  Access to firearms.  Diet, exercise, and sleep habits.  Work and work Astronomer.  Sunscreen use.  Safety issues such as seatbelt and bike helmet use.  Physical exam  Your health care provider will check your:  Height and weight. These may be used to calculate your BMI (body mass index). BMI is a measurement that tells if you are at a healthy weight.  Waist circumference. This measures the distance around your waistline. This measurement also tells if you are at a healthy weight and may help predict your risk of certain diseases, such as type 2 diabetes and high blood pressure.  Heart rate and blood pressure.  Body temperature.  Skin for abnormal spots.  What immunizations do I need?    Vaccines are usually given at various ages, according to a schedule. Your health care provider will recommend vaccines for you based on your age, medical history, and lifestyle or other factors, such as travel or where you work.  What tests do I need?  Screening  Your health care provider may recommend screening tests for certain conditions. This may include:  Lipid and cholesterol levels.  Diabetes screening. This is done by checking your blood sugar (glucose) after you have not eaten for a while (fasting).  Pelvic exam and Pap test.  Hepatitis B test.  Hepatitis C  test.  HIV (human immunodeficiency virus) test.  STI (sexually transmitted infection) testing, if you are at risk.  Lung cancer screening.  Colorectal cancer screening.  Mammogram. Talk with your health care provider about when you should start having regular mammograms. This may depend on whether you have a family history of breast cancer.  BRCA-related cancer screening. This may be done if you have a family history of breast, ovarian, tubal, or peritoneal cancers.  Bone density scan. This is done to screen for osteoporosis.  Talk with your health care provider about your test results, treatment options, and if necessary, the need for more tests.  Follow these instructions at home:  Eating and drinking    Eat a diet that includes fresh fruits and vegetables, whole grains, lean protein, and low-fat dairy products.  Take vitamin and mineral supplements as recommended by your health care provider.  Do not drink alcohol if:  Your health care provider tells you not to drink.  You are pregnant, may be pregnant, or are planning to become pregnant.  If you drink alcohol:  Limit how much you have to 0-1 drink a day.  Know how much alcohol is in your drink. In the [place] one drink equals one 12 oz bottle of beer (355 mL), one 5 oz glass of wine (148 mL), or one 1 oz glass of hard liquor (44 mL).  Lifestyle  Brush your teeth every morning and night with fluoride toothpaste. Floss one time each day.  Exercise for at least  30 minutes 5 or more days each week.  Do not use any products that contain nicotine or tobacco. These products include cigarettes, chewing tobacco, and vaping devices, such as e-cigarettes. If you need help quitting, ask your health care provider.  Do not use drugs.  If you are sexually active, practice safe sex. Use a condom or other form of protection to prevent STIs.  If you do not wish to become pregnant, use a form of birth control. If you plan to become pregnant, see your health care provider for a  prepregnancy visit.  Take aspirin only as told by your health care provider. Make sure that you understand how much to take and what form to take. Work with your health care provider to find out whether it is safe and beneficial for you to take aspirin daily.  Find healthy ways to manage stress, such as:  Meditation, yoga, or listening to music.  Journaling.  Talking to a trusted person.  Spending time with friends and family.  Minimize exposure to UV radiation to reduce your risk of skin cancer.  Safety  Always wear your seat belt while driving or riding in a vehicle.  Do not drive:  If you have been drinking alcohol. Do not ride with someone who has been drinking.  When you are tired or distracted.  While texting.  If you have been using any mind-altering substances or drugs.  Wear a helmet and other protective equipment during sports activities.  If you have firearms in your house, make sure you follow all gun safety procedures.  Seek help if you have been physically or sexually abused.  What's next?  Visit your health care provider once a year for an annual wellness visit.  Ask your health care provider how often you should have your eyes and teeth checked.  Stay up to date on all vaccines.  This information is not intended to replace advice given to you by your health care provider. Make sure you discuss any questions you have with your health care provider.  Document Revised: 12/23/2020 Document Reviewed: 12/23/2020  Elsevier Patient Education  2024 [employer].

## 2024-05-28 ENCOUNTER — Ambulatory Visit: Payer: Self-pay | Admitting: Family Medicine

## 2024-05-28 DIAGNOSIS — R748 Abnormal levels of other serum enzymes: Secondary | ICD-10-CM

## 2024-05-28 DIAGNOSIS — E782 Mixed hyperlipidemia: Secondary | ICD-10-CM

## 2024-05-28 DIAGNOSIS — E7800 Pure hypercholesterolemia, unspecified: Secondary | ICD-10-CM

## 2024-05-28 LAB — COMPREHENSIVE METABOLIC PANEL WITH GFR
ALT: 26 U/L (ref 0–35)
AST: 21 U/L (ref 0–37)
Albumin: 4.5 g/dL (ref 3.5–5.2)
Alkaline Phosphatase: 135 U/L — ABNORMAL HIGH (ref 39–117)
BUN: 17 mg/dL (ref 6–23)
CO2: 31 meq/L (ref 19–32)
Calcium: 9.9 mg/dL (ref 8.4–10.5)
Chloride: 105 meq/L (ref 96–112)
Creatinine, Ser: 0.68 mg/dL (ref 0.40–1.20)
GFR: 96 mL/min (ref >60.00)
Glucose, Bld: 86 mg/dL (ref 70–99)
Potassium: 4.6 meq/L (ref 3.5–5.1)
Sodium: 143 meq/L (ref 135–145)
Total Bilirubin: 0.4 mg/dL (ref 0.2–1.2)
Total Protein: 7.3 g/dL (ref 6.0–8.3)

## 2024-05-28 LAB — CBC WITH DIFFERENTIAL/PLATELET
Basophils Absolute: 0.1 K/uL (ref 0.0–0.1)
Basophils Relative: 1 % (ref 0.0–3.0)
Eosinophils Absolute: 0.6 K/uL (ref 0.0–0.7)
Eosinophils Relative: 7.6 % — ABNORMAL HIGH (ref 0.0–5.0)
HCT: 40.9 % (ref 36.0–46.0)
Hemoglobin: 13.9 g/dL (ref 12.0–15.0)
Lymphocytes Relative: 21.8 % (ref 12.0–46.0)
Lymphs Abs: 1.6 K/uL (ref 0.7–4.0)
MCHC: 34 g/dL (ref 30.0–36.0)
MCV: 91 fl (ref 78.0–100.0)
Monocytes Absolute: 0.4 K/uL (ref 0.1–1.0)
Monocytes Relative: 4.7 % (ref 3.0–12.0)
Neutro Abs: 4.9 K/uL (ref 1.4–7.7)
Neutrophils Relative %: 64.9 % (ref 43.0–77.0)
Platelets: 263 K/uL (ref 150.0–400.0)
RBC: 4.49 Mil/uL (ref 3.87–5.11)
RDW: 13.6 % (ref 11.5–15.5)
WBC: 7.6 K/uL (ref 4.0–10.5)

## 2024-05-28 LAB — LIPID PANEL
Cholesterol: 243 mg/dL — ABNORMAL HIGH (ref 0–200)
HDL: 62.6 mg/dL (ref >39.00)
LDL Cholesterol: 145 mg/dL — ABNORMAL HIGH (ref 0–99)
NonHDL: 180.19
Total CHOL/HDL Ratio: 4
Triglycerides: 174 mg/dL — ABNORMAL HIGH (ref 0.0–149.0)
VLDL: 34.8 mg/dL (ref 0.0–40.0)

## 2024-05-28 LAB — TSH: TSH: 0.74 u[IU]/mL (ref 0.35–5.50)

## 2024-05-28 LAB — HEMOGLOBIN A1C: Hgb A1c MFr Bld: 5.8 % (ref 4.6–6.5)

## 2024-05-28 LAB — VITAMIN D 25 HYDROXY (VIT D DEFICIENCY, FRACTURES): VITD: 35.34 ng/mL (ref 30.00–100.00)

## 2024-05-28 LAB — VITAMIN B12: Vitamin B-12: 589 pg/mL (ref 211–911)

## 2024-05-29 ENCOUNTER — Encounter: Payer: Self-pay | Admitting: Family Medicine

## 2024-07-01 ENCOUNTER — Other Ambulatory Visit

## 2024-07-01 ENCOUNTER — Ambulatory Visit: Payer: Self-pay | Admitting: Family Medicine

## 2024-07-01 DIAGNOSIS — E7800 Pure hypercholesterolemia, unspecified: Secondary | ICD-10-CM

## 2024-07-01 DIAGNOSIS — E782 Mixed hyperlipidemia: Secondary | ICD-10-CM

## 2024-07-01 DIAGNOSIS — R748 Abnormal levels of other serum enzymes: Secondary | ICD-10-CM | POA: Diagnosis not present

## 2024-07-01 LAB — COMPREHENSIVE METABOLIC PANEL WITH GFR
ALT: 21 U/L (ref 3–35)
AST: 19 U/L (ref 5–37)
Albumin: 4.5 g/dL (ref 3.5–5.2)
Alkaline Phosphatase: 115 U/L (ref 39–117)
BUN: 19 mg/dL (ref 6–23)
CO2: 29 meq/L (ref 19–32)
Calcium: 9.7 mg/dL (ref 8.4–10.5)
Chloride: 105 meq/L (ref 96–112)
Creatinine, Ser: 0.66 mg/dL (ref 0.40–1.20)
GFR: 96.63 mL/min
Glucose, Bld: 87 mg/dL (ref 70–99)
Potassium: 4.8 meq/L (ref 3.5–5.1)
Sodium: 142 meq/L (ref 135–145)
Total Bilirubin: 0.5 mg/dL (ref 0.2–1.2)
Total Protein: 7.1 g/dL (ref 6.0–8.3)

## 2024-07-01 LAB — ALKALINE PHOSPHATASE: Alkaline Phosphatase: 115 U/L (ref 39–117)

## 2024-07-01 LAB — LIPID PANEL
Cholesterol: 204 mg/dL — ABNORMAL HIGH (ref 28–200)
HDL: 57.8 mg/dL
LDL Cholesterol: 125 mg/dL — ABNORMAL HIGH (ref 10–99)
NonHDL: 146.52
Total CHOL/HDL Ratio: 4
Triglycerides: 108 mg/dL (ref 10.0–149.0)
VLDL: 21.6 mg/dL (ref 0.0–40.0)

## 2024-07-02 NOTE — Progress Notes (Signed)
Pt reviewed via MyChart.

## 2024-11-04 ENCOUNTER — Encounter: Admitting: Family Medicine

## 2024-11-18 ENCOUNTER — Ambulatory Visit: Admitting: Obstetrics and Gynecology

## 2025-06-02 ENCOUNTER — Encounter: Admitting: Family Medicine
